# Patient Record
Sex: Male | Born: 1980 | Race: Black or African American | Hispanic: No | Marital: Single | Smoking: Former smoker
Health system: Southern US, Community
[De-identification: ages and names within clinical notes are randomized; demographics above are authoritative.]

## PROBLEM LIST (undated history)

## (undated) DIAGNOSIS — I509 Heart failure, unspecified: Secondary | ICD-10-CM

## (undated) DIAGNOSIS — G4730 Sleep apnea, unspecified: Secondary | ICD-10-CM

## (undated) DIAGNOSIS — I10 Essential (primary) hypertension: Secondary | ICD-10-CM

## (undated) DIAGNOSIS — E669 Obesity, unspecified: Secondary | ICD-10-CM

## (undated) HISTORY — PX: HAND TENDON SURGERY: SHX663

---

## 2000-09-14 ENCOUNTER — Encounter: Payer: Self-pay | Admitting: Emergency Medicine

## 2000-09-14 ENCOUNTER — Emergency Department (HOSPITAL_COMMUNITY): Admission: EM | Admit: 2000-09-14 | Discharge: 2000-09-14 | Payer: Self-pay | Admitting: Emergency Medicine

## 2000-10-19 ENCOUNTER — Encounter: Payer: Self-pay | Admitting: Emergency Medicine

## 2000-10-19 ENCOUNTER — Emergency Department (HOSPITAL_COMMUNITY): Admission: EM | Admit: 2000-10-19 | Discharge: 2000-10-19 | Payer: Self-pay | Admitting: Emergency Medicine

## 2003-02-10 ENCOUNTER — Emergency Department (HOSPITAL_COMMUNITY): Admission: AD | Admit: 2003-02-10 | Discharge: 2003-02-10 | Payer: Self-pay | Admitting: Emergency Medicine

## 2005-12-19 ENCOUNTER — Emergency Department (HOSPITAL_COMMUNITY): Admission: EM | Admit: 2005-12-19 | Discharge: 2005-12-19 | Payer: Self-pay | Admitting: Emergency Medicine

## 2005-12-25 ENCOUNTER — Emergency Department (HOSPITAL_COMMUNITY): Admission: EM | Admit: 2005-12-25 | Discharge: 2005-12-25 | Payer: Self-pay | Admitting: Family Medicine

## 2006-10-27 ENCOUNTER — Emergency Department (HOSPITAL_COMMUNITY): Admission: EM | Admit: 2006-10-27 | Discharge: 2006-10-27 | Payer: Self-pay | Admitting: Emergency Medicine

## 2015-05-30 ENCOUNTER — Emergency Department (HOSPITAL_COMMUNITY)
Admission: EM | Admit: 2015-05-30 | Discharge: 2015-05-30 | Disposition: A | Discharge status: Home / Self Care | Payer: Self-pay | Attending: Emergency Medicine | Admitting: Emergency Medicine

## 2015-05-30 ENCOUNTER — Encounter (HOSPITAL_COMMUNITY): Payer: Self-pay | Admitting: Emergency Medicine

## 2015-05-30 ENCOUNTER — Emergency Department (HOSPITAL_COMMUNITY): Payer: Self-pay

## 2015-05-30 DIAGNOSIS — J209 Acute bronchitis, unspecified: Secondary | ICD-10-CM | POA: Insufficient documentation

## 2015-05-30 DIAGNOSIS — F17200 Nicotine dependence, unspecified, uncomplicated: Secondary | ICD-10-CM | POA: Insufficient documentation

## 2015-05-30 DIAGNOSIS — Z79899 Other long term (current) drug therapy: Secondary | ICD-10-CM | POA: Insufficient documentation

## 2015-05-30 MED ORDER — PREDNISONE 20 MG PO TABS: 40.0000 mg | ORAL_TABLET | Freq: Every day | ORAL | Status: DC

## 2015-05-30 MED ORDER — IPRATROPIUM BROMIDE 0.02 % IN SOLN
0.5000 mg | Freq: Once | RESPIRATORY_TRACT | Status: DC
  Filled 2015-05-30: qty 2.5

## 2015-05-30 MED ORDER — ALBUTEROL SULFATE (2.5 MG/3ML) 0.083% IN NEBU
5.0000 mg | INHALATION_SOLUTION | Freq: Once | RESPIRATORY_TRACT | Status: AC
  Administered 2015-05-30: 5 mg via RESPIRATORY_TRACT

## 2015-05-30 MED ORDER — ALBUTEROL SULFATE (2.5 MG/3ML) 0.083% IN NEBU
INHALATION_SOLUTION | RESPIRATORY_TRACT | Status: AC
  Filled 2015-05-30: qty 6

## 2015-05-30 MED ORDER — OXYMETAZOLINE HCL 0.05 % NA SOLN: 1.0000 | Freq: Two times a day (BID) | NASAL | Status: DC

## 2015-05-30 MED ORDER — METHYLPREDNISOLONE SODIUM SUCC 125 MG IJ SOLR
125.0000 mg | Freq: Once | INTRAMUSCULAR | Status: AC
  Administered 2015-05-30: 125 mg via INTRAVENOUS
  Filled 2015-05-30: qty 2

## 2015-05-30 MED ORDER — ALBUTEROL (5 MG/ML) CONTINUOUS INHALATION SOLN
10.0000 mg/h | INHALATION_SOLUTION | RESPIRATORY_TRACT | Status: DC
  Administered 2015-05-30: 10 mg/h via RESPIRATORY_TRACT
  Filled 2015-05-30: qty 20

## 2015-05-30 MED ORDER — LORAZEPAM 1 MG PO TABS
2.0000 mg | ORAL_TABLET | Freq: Once | ORAL | Status: AC
  Administered 2015-05-30: 2 mg via ORAL
  Filled 2015-05-30: qty 2

## 2015-05-30 MED ORDER — BENZONATATE 100 MG PO CAPS: 200.0000 mg | ORAL_CAPSULE | Freq: Two times a day (BID) | ORAL | Status: DC | PRN

## 2015-05-30 NOTE — ED Provider Notes (Signed)
CSN: [id number]     Arrival date & time 05/30/15  0909 History   First MD Initiated Contact with Patient 05/30/15 1001     Chief Complaint  Patient presents with  . Shortness of Breath     (Consider location/radiation/quality/duration/timing/severity/associated sxs/prior Treatment) HPI   Patient is a 35 year old male with no pertinent past medical history who presents to the ED with complaint of shortness of breath. Patient reports over the past 5 days having nasal congestion, rhinorrhea, sore throat, nonproductive cough, chest tightness, shortness of breath and wheezing. He notes his girlfriend has also had similar symptoms over the past week. Patient states he took one dose of his wife's prescription of amoxicillin yesterday and reports taking one dose of her prednisone a few days ago. He states he has been using his albuterol inhaler at home with mild intermittent relief but notes when he used it this morning he did not have any relief. Denies fever, chills, body aches, headache, chest pain, palpitations, abdominal pain, nausea, vomiting, diarrhea.    History reviewed. No pertinent past medical history. History reviewed. No pertinent past surgical history. No family history on file. Social History  Substance Use Topics  . Smoking status: Current Some Day Smoker  . Smokeless tobacco: None  . Alcohol Use: Yes    Review of Systems  HENT: Positive for congestion, rhinorrhea and sore throat.   Respiratory: Positive for cough, chest tightness, shortness of breath and wheezing.   All other systems reviewed and are negative.     Allergies  Review of patient's allergies indicates no known allergies.  Home Medications   Prior to Admission medications   Medication Sig Start Date End Date Taking? Authorizing Provider  albuterol (PROVENTIL HFA;VENTOLIN HFA) 108 (90 Base) MCG/ACT inhaler Inhale 1 puff into the lungs every 6 (six) hours as needed for wheezing or shortness of breath. This  was his mothers inhaler. Pt only used this once.   Yes Historical Provider, MD  benzonatate (TESSALON) 100 MG capsule Take 2 capsules (200 mg total) by mouth 2 (two) times daily as needed for cough. 05/30/15   Barrett Henle, PA-C  oxymetazoline (AFRIN NASAL SPRAY) 0.05 % nasal spray Place 1 spray into both nostrils 2 (two) times daily. 05/30/15   Barrett Henle, PA-C  predniSONE (DELTASONE) 20 MG tablet Take 2 tablets (40 mg total) by mouth daily. 05/30/15   Satira Sark Nadeau, PA-C   BP 126/89 mmHg  Pulse 103  Temp(Src) 98.1 F (36.7 C) (Oral)  Resp 16  Ht 6' (1.829 m)  Wt 149.829 kg  BMI 44.79 kg/m2  SpO2 91% Physical Exam  Constitutional: He is oriented to person, place, and time. He appears well-developed and well-nourished.  HENT:  Head: Normocephalic and atraumatic.  Nose: Rhinorrhea present. Right sinus exhibits no maxillary sinus tenderness and no frontal sinus tenderness. Left sinus exhibits no maxillary sinus tenderness and no frontal sinus tenderness.  Mouth/Throat: Uvula is midline and mucous membranes are normal. Posterior oropharyngeal erythema present. No oropharyngeal exudate, posterior oropharyngeal edema or tonsillar abscesses.  Eyes: Conjunctivae and EOM are normal. Pupils are equal, round, and reactive to light. Right eye exhibits no discharge. Left eye exhibits no discharge. No scleral icterus.  Neck: Normal range of motion. Neck supple.  Cardiovascular: Normal rate, regular rhythm, normal heart sounds and intact distal pulses.   Pulmonary/Chest: Effort normal. No respiratory distress. He has wheezes (diffuse end-expiratory wheezing). He has no rales. He exhibits no tenderness.  Abdominal: Soft. Bowel sounds are  normal. He exhibits no distension and no mass. There is no tenderness. There is no rebound and no guarding.  Musculoskeletal: Normal range of motion. He exhibits no edema.  Lymphadenopathy:    He has no cervical adenopathy.  Neurological:  He is alert and oriented to person, place, and time.  Skin: Skin is warm and dry.  Nursing note and vitals reviewed.   ED Course  Procedures (including critical care time) Labs Review Labs Reviewed - No data to display  Imaging Review Dg Chest 2 View  05/30/2015  CLINICAL DATA:  Worsening cough and shortness of breath; history of bronchitis, current smoker. EXAM: CHEST  2 VIEW COMPARISON:  None in PACs FINDINGS: The lungs are adequately inflated. The interstitial markings are coarse bilaterally. There is no alveolar infiltrate, pleural effusion, or pneumothorax. The heart and pulmonary vascularity are normal. The mediastinum is normal in width. The bony thorax exhibits no acute abnormality. There is pleural thickening along the lateral thoracic walls bilaterally. IMPRESSION: Bronchitic changes which may be acute or chronic or a combination of both. There is no evidence of pneumonia nor CHF. Electronically Signed   By: David  [place] M.D.   On: 05/30/2015 10:01   I have personally reviewed and evaluated these images and lab results as part of my medical decision-making.   EKG Interpretation None      MDM   Final diagnoses:  Acute bronchitis, unspecified organism    Patient presents with shortness of breath, wheezing, nasal congestion, rhinorrhea and chest tightness and sore throat. Patient denies smoking and denies history of asthma however he reports having an albuterol inhaler at home. Initial HR 113 when I entered the pt's room, he was mildly tachypneic but could not sit still due to feeling anxious about being SOB. After finishing my exam, pt was resting comfortably in bed, respiratory rate 17, HR 86. Vital signs otherwise stable. Pt given albuterol neb prior to my initial evaluation. Exam revealed rhinorrhea, posterior oropharyngeal erythema and diffuse and expiratory wheezes bilaterally. Pt given continuous albuterol neb tx and solumedrol.  On reevaluation after continuous neb  treatment, patient reports his breathing has improved. On exam, wheezing significantly improved throughout.  Pt CXR negative for acute infiltrate, evidence of bronchitis. Patients symptoms are consistent with URI/bronchitis, likely viral etiology. Discussed that antibiotics are not indicated for viral infections. Pt will be discharged with symptomatic treatment, steroids, antitussive and decongestant.  Verbalizes understanding and is agreeable with plan. Pt is hemodynamically stable & in NAD prior to dc. Patient given resources to follow up with PCP.  Satira Sark [place], [place] 05/30/15 1245  Pricilla Loveless, MD 06/01/15 [phone]

## 2015-05-30 NOTE — ED Notes (Signed)
Patient states hard to breath and is nervous.  Applied [place] 3L states does not work has a stuffed up nose. Placed a NRB mask on 15 liters for 3 minutes patient relaxed then applied [place] back 3L states feels better with mask on breathing by mouth. Provider notified.

## 2015-05-30 NOTE — ED Notes (Signed)
Pt reports that he had bronchitis and has had shortness of breath all weekend. Pt talking in complete sentences. Pulse ox 97% on room air in triage.

## 2015-05-30 NOTE — Discharge Instructions (Signed)
Take your medications as prescribed. I recommend continuing to use her albuterol inhaler at home as needed for shortness of breath and/or wheezing. He may continue taking Tylenol and/or ibuprofen as prescribed over-the-counter seen for fever and body aches. I recommend drinking at least six 8 ounce glasses of water daily to remain hydrated. Please follow up with a primary care provider from the Resource Guide provided below in 3-4 days as needed if your symptoms have not improved. Please return to the Emergency Department if symptoms worsen or new onset of fever, difficulty breathing, coughing up blood, chest pain, worsening wheezing, lightheadedness, dizziness, abdominal pain, vomiting, unable to tolerate fluids.

## 2015-05-30 NOTE — ED Notes (Signed)
Provider at bedside

## 2016-03-27 ENCOUNTER — Emergency Department (HOSPITAL_COMMUNITY): Payer: Self-pay

## 2016-03-27 ENCOUNTER — Emergency Department (HOSPITAL_COMMUNITY)
Admission: EM | Admit: 2016-03-27 | Discharge: 2016-03-27 | Disposition: A | Discharge status: Home / Self Care | Payer: Self-pay | Attending: Emergency Medicine | Admitting: Emergency Medicine

## 2016-03-27 ENCOUNTER — Encounter (HOSPITAL_COMMUNITY): Payer: Self-pay

## 2016-03-27 DIAGNOSIS — F17200 Nicotine dependence, unspecified, uncomplicated: Secondary | ICD-10-CM | POA: Insufficient documentation

## 2016-03-27 DIAGNOSIS — M5417 Radiculopathy, lumbosacral region: Secondary | ICD-10-CM | POA: Insufficient documentation

## 2016-03-27 LAB — CBG MONITORING, ED
GLUCOSE-CAPILLARY: 117 mg/dL — AB (ref 65–99)
Glucose-Capillary: 117 mg/dL — ABNORMAL HIGH (ref 65–99)

## 2016-03-27 MED ORDER — METHOCARBAMOL 500 MG PO TABS: 500.0000 mg | ORAL_TABLET | Freq: Two times a day (BID) | ORAL | 0 refills | Status: DC

## 2016-03-27 MED ORDER — KETOROLAC TROMETHAMINE 60 MG/2ML IM SOLN
60.0000 mg | Freq: Once | INTRAMUSCULAR | Status: AC
  Administered 2016-03-27: 60 mg via INTRAMUSCULAR
  Filled 2016-03-27: qty 2

## 2016-03-27 MED ORDER — HYDROCODONE-ACETAMINOPHEN 5-325 MG PO TABS: 1.0000 | ORAL_TABLET | Freq: Four times a day (QID) | ORAL | 0 refills | Status: DC | PRN

## 2016-03-27 MED ORDER — OXYCODONE-ACETAMINOPHEN 5-325 MG PO TABS
1.0000 | ORAL_TABLET | Freq: Once | ORAL | Status: AC
  Administered 2016-03-27: 1 via ORAL
  Filled 2016-03-27: qty 1

## 2016-03-27 MED ORDER — PREDNISONE 20 MG PO TABS: 40.0000 mg | ORAL_TABLET | Freq: Every day | ORAL | 0 refills | Status: DC

## 2016-03-27 NOTE — ED Notes (Signed)
POC CBG 117 mg/dL reported to T. Kirichenko PA

## 2016-03-27 NOTE — ED Triage Notes (Signed)
Patient here with 4 days of lower back pain with radiation down right leg. Pain with any change in position. Pain burning through buttock

## 2016-03-27 NOTE — Discharge Instructions (Signed)
Take prednisone for inflammation until all gone. Take naprosyn after finish prednisone. Take norco for severe pain. Take robaxin for spasms. Follow up with primary care doctor or orthopedics.

## 2016-03-27 NOTE — ED Provider Notes (Signed)
[facility]-EMERGENCY DEPT Provider Note   CSN: [id number] Arrival date & time: 03/27/16  1211  By signing my name below, I, Clovis Pu, attest that this documentation has been prepared under the direction and in the presence of  Lashica Hannay, PA-C. Electronically Signed: Clovis Pu, ED Scribe. 03/27/16. 2:01 PM.  History   Chief Complaint Chief Complaint  Patient presents with  . Back Pain    The history is provided by the patient. No language interpreter was used.   HPI Comments:  Jesse Morris is a 36 y.o. male who presents to the Emergency Department complaining of moderate, sharp and dull back pain which radiates down the posterior aspect of his right leg to his calf x 4 days. Pt notes a hx of back pain x 1 year with episodes which last for 1 day and self-resolved. His pain is worse with any movement and with ambulation.No acute injuries. 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 Pt has taken ibuprofen and goody powder with no relief. Pt denies bowel/bladder incontinence, abdominal pain, numbness and weakness. No hx of DM or PCP. No other associated symptoms noted.   History reviewed. No pertinent past medical history.  There are no active problems to display for this patient.   History reviewed. No pertinent surgical history.   Home Medications    Prior to Admission medications   Medication Sig Start Date End Date Taking? Authorizing Provider  albuterol (PROVENTIL HFA;VENTOLIN HFA) 108 (90 Base) MCG/ACT inhaler Inhale 1 puff into the lungs every 6 (six) hours as needed for wheezing or shortness of breath. This was his mothers inhaler. Pt only used this once.    Historical Provider, MD  benzonatate (TESSALON) 100 MG capsule Take 2 capsules (200 mg total) by mouth 2 (two) times daily as needed for cough. 05/30/15   Barrett Henle, PA-C  oxymetazoline (AFRIN NASAL SPRAY) 0.05 % nasal spray Place 1 spray into both nostrils 2 (two) times daily. 05/30/15   Barrett Henle, PA-C  predniSONE (DELTASONE) 20 MG tablet Take 2 tablets (40 mg total) by mouth daily. 05/30/15   Barrett Henle, PA-C    Family History No family history on file.  Social History Social History  Substance Use Topics  . Smoking status: Current Some Day Smoker  . Smokeless tobacco: Never Used  . Alcohol use Yes     Allergies   Patient has no known allergies.   Review of Systems Review of Systems  Gastrointestinal: Negative for abdominal pain.  Genitourinary:       -bowel/bladder incontinence   Musculoskeletal: Positive for back pain and myalgias.  Neurological: Negative for weakness and numbness.   Physical Exam Updated Vital Signs BP (!) 154/109 (BP Location: Right Arm)   Pulse 99   Temp 98.9 F (37.2 C) (Oral)   Resp 20   SpO2 96%   Physical Exam  Constitutional: He is oriented to person, place, and time. He appears well-developed and well-nourished. No distress.  HENT:  Head: Normocephalic and atraumatic.  Eyes: Conjunctivae are normal.  Neck: Neck supple.  Cardiovascular: Normal rate, regular rhythm and normal heart sounds.   Pulmonary/Chest: Effort normal and breath sounds normal. No respiratory distress. He has no wheezes.  Abdominal: He exhibits no distension.  Musculoskeletal:  Midline lumbar spine tenderness, right SI joint tenderness and paravertebral spinal muscle tenderness. Pain with right straight leg raise. Normal appearing right knee and right hip.  Neurological: He is alert and oriented to person,  place, and time.  5/5 and equal lower extremity strength. 2+ and equal patellar reflexes bilaterally. Pt able to dorsiflex bilateral toes and feet with good strength against resistance. Equal sensation bilaterally over thighs and lower legs.   Skin: Skin is warm and dry.  Psychiatric: He has a normal mood and affect.  Nursing note and vitals reviewed.   ED Treatments / Results  DIAGNOSTIC STUDIES:  Oxygen Saturation is 96%  on RA, normal by my interpretation.    COORDINATION OF CARE:  1:58 PM Discussed treatment plan with pt at bedside and pt agreed to plan.  Labs (all labs ordered are listed, but only abnormal results are displayed) Labs Reviewed - No data to display  EKG  EKG Interpretation None       Radiology Dg Lumbar Spine Complete  Result Date: 03/27/2016 CLINICAL DATA:  Low back pain radiating to the posterior right leg for 4 days, no injury EXAM: LUMBAR SPINE - COMPLETE 4+ VIEW COMPARISON:  CT abdomen pelvis of 01/02/2008 FINDINGS: The lumbar vertebrae remain in normal alignment. There is mild degenerative disc disease at L4-5 where there is slight loss of intervertebral disc space. Slight loss of disc space also is noted at L2-3 with mild spurring and sclerosis. No compression deformity is seen. The facet joints are unremarkable. The SI joints appear corticated. The bowel gas pattern is nonspecific. IMPRESSION: Normal alignment.  Mild degenerative disc disease at L4-5 and L2-3. Electronically Signed   By: Dwyane Dee M.D.   On: 03/27/2016 14:52    Procedures Procedures (including critical care time)  Medications Ordered in ED Medications - No data to display   Initial Impression / Assessment and Plan / ED Course  I have reviewed the triage vital signs and the nursing notes.  Pertinent labs & imaging results that were available during my care of the patient were reviewed by me and considered in my medical decision making (see chart for details).  Clinical Course    Patient with lower back pain radiating to the right leg. Symptoms chronic, however worsened over the weekend. Patient's pain is most consistent with lumbar sacral radiculopathy. He does not have any numbness or weakness to the leg however, the trouble controlling his bowels or bladder. No retroflexed to suggest cauda equina. No fever. IV drug use. An injuries. Plain films show degenerative changes. No emergent MRI indicated at  this time. Will treat with prednisone, Norco, Robaxin. Will have him follow-up with her primary care doctor. Patient requested orthopedics referral, will give referral of orthopedist on call. However encouraged to follow with her family doctor if he can. Return precuations discussed.   Vitals:   03/27/16 1216 03/27/16 1534  BP: (!) 154/109 (!) 142/104  Pulse: 99 88  Resp: 20 18  Temp: 98.9 F (37.2 C)   TempSrc: Oral   SpO2: 96% 100%     Final Clinical Impressions(s) / ED Diagnoses   Final diagnoses:  Lumbosacral radiculopathy    New Prescriptions Discharge Medication List as of 03/27/2016  3:22 PM    START taking these medications   Details  HYDROcodone-acetaminophen (NORCO) 5-325 MG tablet Take 1 tablet by mouth every 6 (six) hours as needed for moderate pain., Starting Tue 03/27/2016, Print    methocarbamol (ROBAXIN) 500 MG tablet Take 1 tablet (500 mg total) by mouth 2 (two) times daily., Starting Tue 03/27/2016, Print       I personally performed the services described in this documentation, which was scribed in my presence. The  recorded information has been reviewed and is accurate.     Jaynie Crumble, PA-C 03/27/16 1642    Canary Brim Tegeler, MD 03/27/16 [phone]

## 2016-03-27 NOTE — ED Triage Notes (Signed)
Pt reports back pain worse when coughing or sneezing

## 2016-03-27 NOTE — ED Notes (Signed)
Declined W/C at D/C and was escorted to lobby by RN. 

## 2016-07-19 ENCOUNTER — Emergency Department (HOSPITAL_COMMUNITY): Payer: Self-pay

## 2016-07-19 ENCOUNTER — Emergency Department (HOSPITAL_COMMUNITY)
Admission: EM | Admit: 2016-07-19 | Discharge: 2016-07-19 | Disposition: A | Discharge status: Home / Self Care | Payer: Self-pay | Attending: Emergency Medicine | Admitting: Emergency Medicine

## 2016-07-19 ENCOUNTER — Encounter (HOSPITAL_COMMUNITY): Payer: Self-pay

## 2016-07-19 DIAGNOSIS — R062 Wheezing: Secondary | ICD-10-CM

## 2016-07-19 DIAGNOSIS — Z7982 Long term (current) use of aspirin: Secondary | ICD-10-CM | POA: Insufficient documentation

## 2016-07-19 DIAGNOSIS — F17200 Nicotine dependence, unspecified, uncomplicated: Secondary | ICD-10-CM | POA: Insufficient documentation

## 2016-07-19 DIAGNOSIS — R0602 Shortness of breath: Secondary | ICD-10-CM

## 2016-07-19 LAB — BASIC METABOLIC PANEL
ANION GAP: 13 (ref 5–15)
BUN: 12 mg/dL (ref 6–20)
CO2: 23 mmol/L (ref 22–32)
Calcium: 8.9 mg/dL (ref 8.9–10.3)
Chloride: 102 mmol/L (ref 101–111)
Creatinine, Ser: 1.33 mg/dL — ABNORMAL HIGH (ref 0.61–1.24)
GLUCOSE: 129 mg/dL — AB (ref 65–99)
POTASSIUM: 4.1 mmol/L (ref 3.5–5.1)
Sodium: 138 mmol/L (ref 135–145)

## 2016-07-19 LAB — I-STAT TROPONIN, ED
Troponin i, poc: 0.01 ng/mL (ref 0.00–0.08)
Troponin i, poc: 0.02 ng/mL (ref 0.00–0.08)

## 2016-07-19 LAB — CBC
HEMATOCRIT: 48.8 % (ref 39.0–52.0)
HEMOGLOBIN: 16.1 g/dL (ref 13.0–17.0)
MCH: 31.1 pg (ref 26.0–34.0)
MCHC: 33 g/dL (ref 30.0–36.0)
MCV: 94.4 fL (ref 78.0–100.0)
Platelets: 275 10*3/uL (ref 150–400)
RBC: 5.17 MIL/uL (ref 4.22–5.81)
RDW: 13.3 % (ref 11.5–15.5)
WBC: 5.4 10*3/uL (ref 4.0–10.5)

## 2016-07-19 LAB — D-DIMER, QUANTITATIVE (NOT AT ARMC): D DIMER QUANT: 0.39 ug{FEU}/mL (ref 0.00–0.50)

## 2016-07-19 MED ORDER — ALBUTEROL SULFATE (2.5 MG/3ML) 0.083% IN NEBU
5.0000 mg | INHALATION_SOLUTION | Freq: Once | RESPIRATORY_TRACT | Status: AC
  Administered 2016-07-19: 5 mg via RESPIRATORY_TRACT
  Filled 2016-07-19: qty 6

## 2016-07-19 MED ORDER — IPRATROPIUM BROMIDE 0.02 % IN SOLN
0.5000 mg | Freq: Once | RESPIRATORY_TRACT | Status: AC
  Administered 2016-07-19: 0.5 mg via RESPIRATORY_TRACT
  Filled 2016-07-19: qty 2.5

## 2016-07-19 MED ORDER — SODIUM CHLORIDE 0.9 % IV BOLUS (SEPSIS)
1000.0000 mL | Freq: Once | INTRAVENOUS | Status: AC
  Administered 2016-07-19: 1000 mL via INTRAVENOUS

## 2016-07-19 MED ORDER — ALBUTEROL SULFATE HFA 108 (90 BASE) MCG/ACT IN AERS
2.0000 | INHALATION_SPRAY | RESPIRATORY_TRACT | Status: DC | PRN
  Administered 2016-07-19: 2 via RESPIRATORY_TRACT
  Filled 2016-07-19: qty 6.7

## 2016-07-19 MED ORDER — PREDNISONE 20 MG PO TABS: 40.0000 mg | ORAL_TABLET | Freq: Every day | ORAL | 0 refills | Status: DC

## 2016-07-19 NOTE — ED Notes (Signed)
Patient ambulated with no complaints. O2 sat was 91 while ambulating.

## 2016-07-19 NOTE — Discharge Instructions (Signed)
Please read and follow all provided instructions.  Your diagnoses today include:  1. Shortness of breath   2. Wheezing     Tests performed today include:  Chest x-ray - does not show any pneumonia  D-dimer - does not suggest blood clot  Troponin and EKG - does not suggest heart problem  Vital signs. See below for your results today.   Medications prescribed:   Prednisone - steroid medicine   It is best to take this medication in the morning to prevent sleeping problems. If you are diabetic, monitor your blood sugar closely and stop taking Prednisone if blood sugar is over 300. Take with food to prevent stomach upset.    Albuterol inhaler - medication that opens up your airway  Use inhaler as follows: 1-2 puffs with spacer every 4 hours as needed for wheezing, cough, or shortness of breath.   Take any prescribed medications only as directed.  Home care instructions:  Follow any educational materials contained in this packet.  Follow-up instructions: Please follow-up with your primary care provider in the next 3 days for further evaluation of your symptoms and a recheck if you are not feeling better.   Return instructions:   Please return to the Emergency Department if you experience worsening symptoms.  Please return with worsening wheezing, shortness of breath, or difficulty breathing.  Return with persistent fever above 101F.   Please return if you have any other emergent concerns.  Additional Information:  Your vital signs today were: BP (!) 125/91    Pulse (!) 113    Temp 98.5 F (36.9 C) (Oral)    Resp (!) 22    Ht 6' (1.829 m)    Wt (!) 145.2 kg    SpO2 (!) 88%    BMI 43.40 kg/m  If your blood pressure (BP) was elevated above 135/85 this visit, please have this repeated by your doctor within one month. --------------

## 2016-07-19 NOTE — ED Provider Notes (Signed)
[facility]PT Provider Note   CSN: [id number] Arrival date & time: 07/19/16  1241     History   Chief Complaint Chief Complaint  Patient presents with  . Chest Pain  . Shortness of Breath    HPI Jesse Morris is a 36 y.o. male.  Patient with possible history of high blood pressure and high cholesterol although is on no medications for these, smoking -- presents with complaint of burning in his chest for the past 2 days. This has been associated with wheezing, shortness of breath. Cough has been productive of pink sputum although he denies overt blood. Pain does not radiate and is not exertional. No vomiting. He does get sweaty at times. He states that symptoms are improved sitting up. Patient denies risk factors for pulmonary embolism including: unilateral leg swelling or tenderness, history of DVT/PE/other blood clots, use of exogenous hormones, recent immobilizations, recent surgery, recent travel (>4hr segment), malignancy, hemoptysis. He states that his symptoms today reminds him of when he was seen in 05/2015. At that time a breathing treatment made him better. Also noted to be tachycardic at that time. No strong family history of coronary artery disease in first-degree relatives, family history of diabetes but not in the patient. No recent viral illnesses or fevers. No heavy NSAID or alcohol use. Denies IV drug use or rashes.        History reviewed. No pertinent past medical history.  There are no active problems to display for this patient.   History reviewed. No pertinent surgical history.     Home Medications    Prior to Admission medications   Medication Sig Start Date End Date Taking? Authorizing Provider  Aspirin-Acetaminophen-Caffeine (GOODYS EXTRA STRENGTH) 260-130-16 MG TABS Take 1 packet by mouth every 6 (six) hours as needed.   Yes [provider]  albuterol (PROVENTIL HFA;VENTOLIN HFA) 108 (90 Base) MCG/ACT inhaler Inhale 1 puff into the lungs  every 6 (six) hours as needed for wheezing or shortness of breath. This was his mothers inhaler. Pt only used this once.    [provider]  predniSONE (DELTASONE) 20 MG tablet Take 2 tablets (40 mg total) by mouth daily. 07/19/16   Renne Crigler, PA-C    Family History No family history on file.  Social History Social History  Substance Use Topics  . Smoking status: Current Some Day Smoker  . Smokeless tobacco: Never Used  . Alcohol use Yes     Allergies   Patient has no known allergies.   Review of Systems Review of Systems  Constitutional: Positive for diaphoresis. Negative for fever.  HENT: Negative for rhinorrhea and sore throat.   Eyes: Negative for redness.  Respiratory: Positive for cough, shortness of breath and wheezing.   Cardiovascular: Positive for chest pain. Negative for palpitations and leg swelling.  Gastrointestinal: Negative for abdominal pain, diarrhea, nausea and vomiting.  Genitourinary: Negative for dysuria.  Musculoskeletal: Negative for back pain, myalgias and neck pain.  Skin: Negative for rash.  Neurological: Negative for syncope, light-headedness and headaches.  Psychiatric/Behavioral: The patient is not nervous/anxious.      Physical Exam Updated Vital Signs BP (!) 132/96   Pulse (!) 108   Temp 98.5 F (36.9 C) (Oral)   Resp (!) 28   Ht 6' (1.829 m)   Wt (!) 145.2 kg   SpO2 94%   BMI 43.40 kg/m   Physical Exam  Constitutional: He appears well-developed and well-nourished.  HENT:  Head: Normocephalic and atraumatic.  Mouth/Throat: Oropharynx  is clear and moist and mucous membranes are normal. Mucous membranes are not dry.  Eyes: Conjunctivae are normal. Right eye exhibits no discharge. Left eye exhibits no discharge.  Neck: Trachea normal and normal range of motion. Neck supple. Normal carotid pulses and no JVD present. No muscular tenderness present. Carotid bruit is not present. No tracheal deviation present.    Cardiovascular: Regular rhythm, S1 normal, S2 normal, normal heart sounds and intact distal pulses.  Tachycardia present.  Exam reveals no gallop, no distant heart sounds, no friction rub and no decreased pulses.   No murmur heard. Pulmonary/Chest: Effort normal. No respiratory distress. He has wheezes (mild/moderate end-expiratory wheezing, scattered). He exhibits no tenderness.  Abdominal: Soft. Normal aorta and bowel sounds are normal. There is no tenderness. There is no rebound and no guarding.  Musculoskeletal: He exhibits no edema.  Neurological: He is alert.  Skin: Skin is warm and dry. He is not diaphoretic. No cyanosis. No pallor.  Psychiatric: He has a normal mood and affect.  Nursing note and vitals reviewed.    ED Treatments / Results  Labs (all labs ordered are listed, but only abnormal results are displayed) Labs Reviewed  BASIC METABOLIC PANEL - Abnormal; Notable for the following:       Result Value   Glucose, Bld 129 (*)    Creatinine, Ser 1.33 (*)    All other components within normal limits  CBC  D-DIMER, QUANTITATIVE (NOT AT [facility])  I-STAT TROPOININ, ED  Rosezena Sensor, ED    ED ECG REPORT   Date: 07/19/2016  Rate: 122  Rhythm: sinus tachycardia  QRS Axis: normal  Intervals: normal  ST/T Wave abnormalities: normal  Conduction Disutrbances:none  Narrative Interpretation:   Old EKG Reviewed: none available  I have personally reviewed the EKG tracing and agree with the computerized printout as noted.  Radiology Dg Chest 2 View  Result Date: 07/19/2016 CLINICAL DATA:  Cough, shortness of breath and chest burning since last night. EXAM: CHEST  2 VIEW COMPARISON:  PA and lateral chest 05/30/2015. FINDINGS: The lungs are clear. Heart size is normal. No pneumothorax or pleural fluid. No bony abnormality. IMPRESSION: Normal chest. Electronically Signed   By: Drusilla Kanner M.D.   On: 07/19/2016 13:20    Procedures Procedures (including critical care  time)  Medications Ordered in ED Medications  albuterol (PROVENTIL) (2.5 MG/3ML) 0.083% nebulizer solution 5 mg (not administered)  ipratropium (ATROVENT) nebulizer solution 0.5 mg (not administered)  sodium chloride 0.9 % bolus 1,000 mL (not administered)     Initial Impression / Assessment and Plan / ED Course  I have reviewed the triage vital signs and the nursing notes.  Pertinent labs & imaging results that were available during my care of the patient were reviewed by me and considered in my medical decision making (see chart for details).     3:00 PM Patient seen and examined. Work-up initiated. Medications ordered. He will need screened for PE (dimer added) and delta troponin and EKG. He is not in respiratory distress or overt heart failure. CXR reviewed.   Vital signs reviewed and are as follows: BP (!) 132/96   Pulse (!) 108   Temp 98.5 F (36.9 C) (Oral)   Resp (!) 28   Ht 6' (1.829 m)   Wt (!) 145.2 kg   SpO2 94%   BMI 43.40 kg/m   4:01 PM I rechecked patient. O2 sat documented at 85%. O2 sat is low 90's when I enter. Pt exam is  unchanged. He has yet to receive breathing meds, fluids. Pending labs.   6:18 PM Patient States that he felt much better after first breathing treatment. Wheezing nearly resolved on reexam. Second troponin EKG unchanged. D-dimer is negative.  Patient has ambulated with borderline oxygen saturation. Although, patient states that he was not short of breath with ambulation. One additional breathing treatment ordered and given. Patient continues to feel well after this.  Oxygen saturation documented to be 88% with heart rate of 113 (post-albuterol) When I enter the room, patient is 93% on room air and has a heart rate in the upper 90s. He is in no distress. Speaks in full sentences.   We discussed return to the emergency department with worsening shortness of breath, severe lightheadedness or passing out, chest pains, fever, new symptoms or other  concerns.  He verbalizes understanding and agrees with plan.  Final Clinical Impressions(s) / ED Diagnoses   Final diagnoses:  Shortness of breath  Wheezing   Patient with shortness of breath and wheezing similar to episode over a year ago. Patient was evaluated for cardiac etiology and has had 2 negative troponins and unchanged, nonischemic EKG. No concern for pericarditis. No signs of myocarditis or risk factors for the same. He has had a d-dimer that is negative. No objective findings of DVT or PE on exam. Patient has ambulated without significant shortness of breath. Suspect that symptoms are related to bronchospasm. Do not suspect COPD or CHF. Patient is clinically improved in emergency department and after discussion with patient, feel that he is appropriate for discharge at this time. Return instructions discussed as above.  New Prescriptions New Prescriptions   PREDNISONE (DELTASONE) 20 MG TABLET    Take 2 tablets (40 mg total) by mouth daily.     Renne Crigler, PA-C 07/19/16 Franki Cabot, MD 07/23/16 [phone]

## 2016-07-19 NOTE — ED Notes (Signed)
Pt verbalized understanding discharge instructions and denies any further needs or questions at this time. VS stable, ambulatory and steady gait.   

## 2016-07-19 NOTE — ED Triage Notes (Addendum)
Pt presents to the ed with complaints of burning sensation in his chest and shortness of breath that started yesterday morning.  Pt is speaking in full sentences, tachycardic and diaphoretic in triage.  Denies history of the same

## 2016-07-19 NOTE — Progress Notes (Signed)
ED CM met with patient at bedside to discuss transitional care , patient confirms information Patient is uninsured and without an PCP. CM discussed the [facility] and services rendered patient is agreeable with establishing care at the clinic. Patient instructed to contact the clinic tomorrow after 9a to schedule ED follow up appointment, patient verbalized understanding teach back done. No further questions or concerns, all information placed on AVS as well. No further ED CM needs identified.

## 2016-10-19 ENCOUNTER — Emergency Department (HOSPITAL_COMMUNITY)
Admission: EM | Admit: 2016-10-19 | Discharge: 2016-10-19 | Disposition: A | Discharge status: Home / Self Care | Payer: Self-pay | Attending: Emergency Medicine | Admitting: Emergency Medicine

## 2016-10-19 ENCOUNTER — Encounter (HOSPITAL_COMMUNITY): Payer: Self-pay | Admitting: *Deleted

## 2016-10-19 DIAGNOSIS — L03116 Cellulitis of left lower limb: Secondary | ICD-10-CM | POA: Insufficient documentation

## 2016-10-19 DIAGNOSIS — Z79899 Other long term (current) drug therapy: Secondary | ICD-10-CM | POA: Insufficient documentation

## 2016-10-19 DIAGNOSIS — F17200 Nicotine dependence, unspecified, uncomplicated: Secondary | ICD-10-CM | POA: Insufficient documentation

## 2016-10-19 HISTORY — DX: Obesity, unspecified: E66.9

## 2016-10-19 MED ORDER — DOXYCYCLINE HYCLATE 100 MG PO TABS
100.0000 mg | ORAL_TABLET | Freq: Once | ORAL | Status: AC
  Administered 2016-10-19: 100 mg via ORAL
  Filled 2016-10-19: qty 1

## 2016-10-19 MED ORDER — DOXYCYCLINE HYCLATE 100 MG PO CAPS: 100.0000 mg | ORAL_CAPSULE | Freq: Two times a day (BID) | ORAL | 0 refills | Status: AC

## 2016-10-19 NOTE — ED Triage Notes (Signed)
Pt has redness and warmth to left lower leg, behind his left knee. Reports that pain started approx 3 days ago. Denies any injury. Pain increases with ambulation. Denies fever.

## 2016-10-19 NOTE — ED Provider Notes (Signed)
[facility] Provider Note   CSN: [id number] Arrival date & time: 10/19/16  1254     History   Chief Complaint Chief Complaint  Patient presents with  . Knee Pain    HPI Jesse Morris is a 36 y.o. male with history of obesity who presents today with chief complaint acute onset, gradually worsening redness swelling and tenderness to the left lower leg. He states that 3 days ago he noted a small area of redness to the medial aspect of his left leg just distal to the knee and since then he has had progressively worsening spread of erythema, swelling, and tenderness. He states pain is constant, throbbing, and worsens on palpation and movement of the knee. He states walking is painful as well. Pain does not radiate past the areas of erythema. He denies any known trauma or falls, no history of DVT or PE, denies recent travel or surgeries or hemoptysis. He denies shortness of breath, fevers, chest pain, nausea, or vomiting. Has not tried anything for his symptoms. He does state that he recently went swimming in a pool that had been closed for some time and does not think that it had been cleaned properly.  The history is provided by the patient.    Past Medical History:  Diagnosis Date  . Obesity     There are no active problems to display for this patient.   History reviewed. No pertinent surgical history.     Home Medications    Prior to Admission medications   Medication Sig Start Date End Date Taking? Authorizing Provider  albuterol (PROVENTIL HFA;VENTOLIN HFA) 108 (90 Base) MCG/ACT inhaler Inhale 1 puff into the lungs every 6 (six) hours as needed for wheezing or shortness of breath. This was his mothers inhaler. Pt only used this once.    [provider]  Aspirin-Acetaminophen-Caffeine (GOODYS EXTRA STRENGTH) 260-130-16 MG TABS Take 1 packet by mouth every 6 (six) hours as needed.    [provider]  doxycycline (VIBRAMYCIN) 100 MG capsule Take 1 capsule  (100 mg total) by mouth 2 (two) times daily. 10/19/16 10/29/16  Michela Pitcher A, PA-C  predniSONE (DELTASONE) 20 MG tablet Take 2 tablets (40 mg total) by mouth daily. 07/19/16   Renne Crigler, PA-C    Family History History reviewed. No pertinent family history.  Social History Social History  Substance Use Topics  . Smoking status: Current Some Day Smoker  . Smokeless tobacco: Never Used  . Alcohol use Yes     Allergies   Patient has no known allergies.   Review of Systems Review of Systems  Constitutional: Negative for chills and fever.  Respiratory: Negative for shortness of breath.   Cardiovascular: Negative for chest pain.  Skin: Positive for color change.  Neurological: Negative for syncope, weakness and numbness.  All other systems reviewed and are negative.    Physical Exam Updated Vital Signs BP (!) 149/97 (BP Location: Right Arm)   Pulse 75   Temp 98.2 F (36.8 C) (Oral)   Resp 18   Ht 6' (1.829 m)   SpO2 95%   Physical Exam  Constitutional: He appears well-developed and well-nourished. No distress.  HENT:  Head: Normocephalic and atraumatic.  Eyes: Conjunctivae are normal. Right eye exhibits no discharge. Left eye exhibits no discharge.  Neck: No JVD present. No tracheal deviation present.  Cardiovascular: Normal rate and intact distal pulses.   2+ DP/PT pulses bilaterally, Homans absent bilaterally  Pulmonary/Chest: Effort normal.  Abdominal: He exhibits no  distension.  Musculoskeletal: He exhibits no edema.  No tenderness to palpation of the left knee joint. Normal range of motion of the knee with pain on flexion due to  erythema distal to the left knee noted in the skin section. No varus or valgus deformity, negative anterior/posterior drawer tests. 5/5 strength of bilateral lower extremity major muscle groups. No appreciable swelling of the LLE compared tot he right and calves measure 44cm bl at widest point  Neurological: He is alert.  Fluent speech,  no facial droop, sensation intact to soft touch of bilateral extremities. Antalgic gait.  Skin: Skin is warm and dry. Capillary refill takes less than 2 seconds. There is erythema.  10 cm x 6 cm area of erythema to the left medial lower leg just distal to the knee. No involvement of the calf. Compartments are soft. The area is indurated without evidence of fluctuance. It is tender to palpation. No drainage or bleeding noted.  Psychiatric: He has a normal mood and affect. His behavior is normal.  Nursing note and vitals reviewed.    ED Treatments / Results  Labs (all labs ordered are listed, but only abnormal results are displayed) Labs Reviewed - No data to display  EKG  EKG Interpretation None       Radiology No results found.  Procedures Procedures (including critical care time)  Medications Ordered in ED Medications  doxycycline (VIBRA-TABS) tablet 100 mg (100 mg Oral Given 10/19/16 1715)     Initial Impression / Assessment and Plan / ED Course  I have reviewed the triage vital signs and the nursing notes.  Pertinent labs & imaging results that were available during my care of the patient were reviewed by me and considered in my medical decision making (see chart for details).     Patient presents with cellulitis of the left lower extremity. Afebrile, vital signs are stable. He is neurovascularly intact. No evidence of gout in the absence of pain in the knee joint. Doubt DVT in the absence of risk factors and without appreciable swelling or pain of the calf. Pt is without risk factors for HIV; no recent use of steroids or other immunosuppressive medications; no Hx of diabetes.  Pt is without gross abscess for which I&D would be possible.  Area marked and pt encouraged to return if redness begins to streak, extends beyond the markings, and/or fever or nausea/vomiting develop.  Pt is alert, oriented, NAD, afebrile, non tachycardic, nonseptic and nontoxic appearing.  Pt to be  d/c on oral antibiotics with strict f/u instructions.  First dose of doxycycline given in the ED.   Final Clinical Impressions(s) / ED Diagnoses   Final diagnoses:  Cellulitis of left lower extremity    New Prescriptions New Prescriptions   DOXYCYCLINE (VIBRAMYCIN) 100 MG CAPSULE    Take 1 capsule (100 mg total) by mouth 2 (two) times daily.     Jeanie Sewer, PA-C 10/19/16 1726    Donnetta Hutching, MD 10/20/16 2133

## 2016-10-19 NOTE — Discharge Instructions (Signed)
Please take all of your antibiotics until finished!   You may develop abdominal discomfort or diarrhea from the antibiotic.  You may help offset this with probiotics which you can buy or get in yogurt. Do not eat  or take the probiotics until 2 hours after your antibiotic.   Apply ice or heat to the affected area for comfort. He may take ibuprofen or Tylenol as needed for pain. Return to the ED immediately if any concerning signs or symptoms develop such as fever, worsening spread of redness or streaking past the marked areas.

## 2016-10-19 NOTE — ED Notes (Signed)
Pt unable to sign due to signature pad unavailable, pt verbalized understanding of d.c instructions and had no further questions

## 2021-03-16 DIAGNOSIS — J101 Influenza due to other identified influenza virus with other respiratory manifestations: Secondary | ICD-10-CM | POA: Insufficient documentation

## 2021-04-13 ENCOUNTER — Emergency Department (HOSPITAL_COMMUNITY): Payer: BC Managed Care – PPO

## 2021-04-13 ENCOUNTER — Other Ambulatory Visit: Payer: Self-pay

## 2021-04-13 ENCOUNTER — Inpatient Hospital Stay (HOSPITAL_COMMUNITY)
Admission: EM | Admit: 2021-04-13 | Discharge: 2021-04-19 | DRG: 189 | Disposition: A | Discharge status: Home / Self Care | Payer: BC Managed Care – PPO | Attending: Family Medicine | Admitting: Family Medicine

## 2021-04-13 ENCOUNTER — Encounter (HOSPITAL_COMMUNITY): Payer: Self-pay

## 2021-04-13 DIAGNOSIS — R0902 Hypoxemia: Secondary | ICD-10-CM | POA: Diagnosis not present

## 2021-04-13 DIAGNOSIS — I509 Heart failure, unspecified: Secondary | ICD-10-CM | POA: Diagnosis not present

## 2021-04-13 DIAGNOSIS — I2510 Atherosclerotic heart disease of native coronary artery without angina pectoris: Secondary | ICD-10-CM | POA: Diagnosis present

## 2021-04-13 DIAGNOSIS — R778 Other specified abnormalities of plasma proteins: Secondary | ICD-10-CM

## 2021-04-13 DIAGNOSIS — Z20822 Contact with and (suspected) exposure to covid-19: Secondary | ICD-10-CM | POA: Diagnosis present

## 2021-04-13 DIAGNOSIS — R0609 Other forms of dyspnea: Secondary | ICD-10-CM | POA: Diagnosis not present

## 2021-04-13 DIAGNOSIS — J9601 Acute respiratory failure with hypoxia: Secondary | ICD-10-CM | POA: Diagnosis present

## 2021-04-13 DIAGNOSIS — Z77098 Contact with and (suspected) exposure to other hazardous, chiefly nonmedicinal, chemicals: Secondary | ICD-10-CM | POA: Diagnosis present

## 2021-04-13 DIAGNOSIS — F17210 Nicotine dependence, cigarettes, uncomplicated: Secondary | ICD-10-CM | POA: Diagnosis present

## 2021-04-13 DIAGNOSIS — I77810 Thoracic aortic ectasia: Secondary | ICD-10-CM | POA: Diagnosis present

## 2021-04-13 DIAGNOSIS — I5021 Acute systolic (congestive) heart failure: Secondary | ICD-10-CM

## 2021-04-13 DIAGNOSIS — E782 Mixed hyperlipidemia: Secondary | ICD-10-CM | POA: Diagnosis present

## 2021-04-13 DIAGNOSIS — Z79899 Other long term (current) drug therapy: Secondary | ICD-10-CM

## 2021-04-13 DIAGNOSIS — R0602 Shortness of breath: Secondary | ICD-10-CM | POA: Diagnosis present

## 2021-04-13 DIAGNOSIS — I428 Other cardiomyopathies: Secondary | ICD-10-CM | POA: Diagnosis present

## 2021-04-13 DIAGNOSIS — D649 Anemia, unspecified: Secondary | ICD-10-CM | POA: Diagnosis present

## 2021-04-13 DIAGNOSIS — G4733 Obstructive sleep apnea (adult) (pediatric): Secondary | ICD-10-CM | POA: Diagnosis present

## 2021-04-13 DIAGNOSIS — Z6841 Body Mass Index (BMI) 40.0 and over, adult: Secondary | ICD-10-CM

## 2021-04-13 DIAGNOSIS — I10 Essential (primary) hypertension: Secondary | ICD-10-CM | POA: Diagnosis not present

## 2021-04-13 DIAGNOSIS — E662 Morbid (severe) obesity with alveolar hypoventilation: Secondary | ICD-10-CM | POA: Diagnosis present

## 2021-04-13 DIAGNOSIS — G4730 Sleep apnea, unspecified: Secondary | ICD-10-CM

## 2021-04-13 DIAGNOSIS — R7989 Other specified abnormal findings of blood chemistry: Secondary | ICD-10-CM

## 2021-04-13 DIAGNOSIS — E876 Hypokalemia: Secondary | ICD-10-CM

## 2021-04-13 DIAGNOSIS — I272 Pulmonary hypertension, unspecified: Secondary | ICD-10-CM

## 2021-04-13 DIAGNOSIS — I110 Hypertensive heart disease with heart failure: Secondary | ICD-10-CM | POA: Diagnosis present

## 2021-04-13 DIAGNOSIS — I158 Other secondary hypertension: Secondary | ICD-10-CM | POA: Diagnosis not present

## 2021-04-13 DIAGNOSIS — J9611 Chronic respiratory failure with hypoxia: Secondary | ICD-10-CM | POA: Diagnosis present

## 2021-04-13 DIAGNOSIS — E66813 Obesity, class 3: Secondary | ICD-10-CM

## 2021-04-13 HISTORY — DX: Essential (primary) hypertension: I10

## 2021-04-13 HISTORY — DX: Sleep apnea, unspecified: G47.30

## 2021-04-13 LAB — COMPREHENSIVE METABOLIC PANEL
ALT: 26 U/L (ref 0–44)
AST: 29 U/L (ref 15–41)
Albumin: 3.8 g/dL (ref 3.5–5.0)
Alkaline Phosphatase: 43 U/L (ref 38–126)
Anion gap: 8 (ref 5–15)
BUN: 14 mg/dL (ref 6–20)
CO2: 29 mmol/L (ref 22–32)
Calcium: 8.8 mg/dL — ABNORMAL LOW (ref 8.9–10.3)
Chloride: 102 mmol/L (ref 98–111)
Creatinine, Ser: 0.98 mg/dL (ref 0.61–1.24)
GFR, Estimated: >60 mL/min (ref >60)
Glucose, Bld: 118 mg/dL — ABNORMAL HIGH (ref 70–99)
Potassium: 3.5 mmol/L (ref 3.5–5.1)
Sodium: 139 mmol/L (ref 135–145)
Total Bilirubin: 0.1 mg/dL — ABNORMAL LOW (ref 0.3–1.2)
Total Protein: 7.3 g/dL (ref 6.5–8.1)

## 2021-04-13 LAB — CBC WITH DIFFERENTIAL/PLATELET
Abs Immature Granulocytes: 0.01 10*3/uL (ref 0.00–0.07)
Basophils Absolute: 0 10*3/uL (ref 0.0–0.1)
Basophils Relative: 1 %
Eosinophils Absolute: 0.2 10*3/uL (ref 0.0–0.5)
Eosinophils Relative: 4 %
HCT: 38.2 % — ABNORMAL LOW (ref 39.0–52.0)
Hemoglobin: 11.9 g/dL — ABNORMAL LOW (ref 13.0–17.0)
Immature Granulocytes: 0 %
Lymphocytes Relative: 36 %
Lymphs Abs: 1.8 10*3/uL (ref 0.7–4.0)
MCH: 28.7 pg (ref 26.0–34.0)
MCHC: 31.2 g/dL (ref 30.0–36.0)
MCV: 92 fL (ref 80.0–100.0)
Monocytes Absolute: 0.4 10*3/uL (ref 0.1–1.0)
Monocytes Relative: 9 %
Neutro Abs: 2.5 10*3/uL (ref 1.7–7.7)
Neutrophils Relative %: 50 %
Platelets: 284 10*3/uL (ref 150–400)
RBC: 4.15 MIL/uL — ABNORMAL LOW (ref 4.22–5.81)
RDW: 15.6 % — ABNORMAL HIGH (ref 11.5–15.5)
WBC: 4.9 10*3/uL (ref 4.0–10.5)
nRBC: 0 % (ref 0.0–0.2)

## 2021-04-13 LAB — RESP PANEL BY RT-PCR (FLU A&B, COVID) ARPGX2
Influenza A by PCR: NEGATIVE
Influenza B by PCR: NEGATIVE
SARS Coronavirus 2 by RT PCR: NEGATIVE

## 2021-04-13 LAB — I-STAT CHEM 8, ED
BUN: 13 mg/dL (ref 6–20)
Calcium, Ion: 1.16 mmol/L (ref 1.15–1.40)
Chloride: 100 mmol/L (ref 98–111)
Creatinine, Ser: 1 mg/dL (ref 0.61–1.24)
Glucose, Bld: 117 mg/dL — ABNORMAL HIGH (ref 70–99)
HCT: 39 % (ref 39.0–52.0)
Hemoglobin: 13.3 g/dL (ref 13.0–17.0)
Potassium: 3.6 mmol/L (ref 3.5–5.1)
Sodium: 141 mmol/L (ref 135–145)
TCO2: 31 mmol/L (ref 22–32)

## 2021-04-13 LAB — TROPONIN I (HIGH SENSITIVITY): Troponin I (High Sensitivity): 66 ng/L — ABNORMAL HIGH (ref <18)

## 2021-04-13 LAB — BLOOD GAS, VENOUS
Acid-Base Excess: 3.2 mmol/L — ABNORMAL HIGH (ref 0.0–2.0)
Bicarbonate: 29.3 mmol/L — ABNORMAL HIGH (ref 20.0–28.0)
O2 Saturation: 78.7 %
Patient temperature: 98.6
pCO2, Ven: 54.6 mmHg (ref 44.0–60.0)
pH, Ven: 7.348 (ref 7.250–7.430)
pO2, Ven: 48.8 mmHg — ABNORMAL HIGH (ref 32.0–45.0)

## 2021-04-13 LAB — BRAIN NATRIURETIC PEPTIDE: B Natriuretic Peptide: 31.6 pg/mL (ref 0.0–100.0)

## 2021-04-13 LAB — LACTIC ACID, PLASMA
Lactic Acid, Venous: 2.4 mmol/L (ref 0.5–1.9)
Lactic Acid, Venous: 1.3 mmol/L (ref 0.5–1.9)

## 2021-04-13 LAB — D-DIMER, QUANTITATIVE: D-Dimer, Quant: 0.48 ug/mL-FEU (ref 0.00–0.50)

## 2021-04-13 MED ORDER — ENOXAPARIN SODIUM 60 MG/0.6ML IJ SOSY
60.0000 mg | PREFILLED_SYRINGE | INTRAMUSCULAR | Status: DC
  Administered 2021-04-14 – 2021-04-19 (×5): 60 mg via SUBCUTANEOUS
  Filled 2021-04-13 (×5): qty 0.6

## 2021-04-13 MED ORDER — ACETAMINOPHEN 325 MG PO TABS
650.0000 mg | ORAL_TABLET | Freq: Four times a day (QID) | ORAL | Status: DC | PRN
  Administered 2021-04-16 – 2021-04-17 (×3): 650 mg via ORAL
  Filled 2021-04-13 (×3): qty 2

## 2021-04-13 MED ORDER — UMECLIDINIUM BROMIDE 62.5 MCG/ACT IN AEPB
1.0000 | INHALATION_SPRAY | Freq: Every day | RESPIRATORY_TRACT | Status: DC
  Administered 2021-04-14 – 2021-04-19 (×5): 1 via RESPIRATORY_TRACT
  Filled 2021-04-13: qty 7

## 2021-04-13 MED ORDER — MOMETASONE FURO-FORMOTEROL FUM 200-5 MCG/ACT IN AERO
2.0000 | INHALATION_SPRAY | Freq: Two times a day (BID) | RESPIRATORY_TRACT | Status: DC
  Administered 2021-04-14 – 2021-04-19 (×10): 2 via RESPIRATORY_TRACT
  Filled 2021-04-13: qty 8.8

## 2021-04-13 MED ORDER — IOHEXOL 350 MG/ML SOLN
100.0000 mL | Freq: Once | INTRAVENOUS | Status: AC | PRN
  Administered 2021-04-13: 100 mL via INTRAVENOUS

## 2021-04-13 MED ORDER — SODIUM CHLORIDE 0.9 % IV BOLUS
1000.0000 mL | Freq: Once | INTRAVENOUS | Status: AC
  Administered 2021-04-13: 1000 mL via INTRAVENOUS

## 2021-04-13 MED ORDER — ALBUTEROL SULFATE (2.5 MG/3ML) 0.083% IN NEBU: 2.5000 mg | INHALATION_SOLUTION | RESPIRATORY_TRACT | Status: DC | PRN

## 2021-04-13 MED ORDER — ONDANSETRON HCL 4 MG PO TABS: 4.0000 mg | ORAL_TABLET | Freq: Four times a day (QID) | ORAL | Status: DC | PRN

## 2021-04-13 MED ORDER — LABETALOL HCL 5 MG/ML IV SOLN
5.0000 mg | INTRAVENOUS | Status: DC | PRN
  Administered 2021-04-13 – 2021-04-14 (×2): 10 mg via INTRAVENOUS
  Administered 2021-04-14: 5 mg via INTRAVENOUS
  Administered 2021-04-14 – 2021-04-15 (×2): 10 mg via INTRAVENOUS
  Filled 2021-04-13 (×5): qty 4

## 2021-04-13 MED ORDER — ONDANSETRON HCL 4 MG/2ML IJ SOLN: 4.0000 mg | Freq: Four times a day (QID) | INTRAMUSCULAR | Status: DC | PRN

## 2021-04-13 MED ORDER — ACETAMINOPHEN 650 MG RE SUPP: 650.0000 mg | Freq: Four times a day (QID) | RECTAL | Status: DC | PRN

## 2021-04-13 MED ORDER — ACETAMINOPHEN 500 MG PO TABS
1000.0000 mg | ORAL_TABLET | Freq: Once | ORAL | Status: AC
  Administered 2021-04-13: 1000 mg via ORAL
  Filled 2021-04-13: qty 2

## 2021-04-13 NOTE — ED Triage Notes (Signed)
Pt c/o SOB since he had pneumonia last month. Pt states he has sleep apnea and does not have a Cipap and is always tired. Pt states his SOB has worsened and he feels constantly winded.

## 2021-04-13 NOTE — Assessment & Plan Note (Addendum)
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

## 2021-04-13 NOTE — ED Notes (Signed)
This RN walked into room with O2 reading 79% RA and pt had labored breathing. Pt placed on 2L [place] O2 and back to 98%.

## 2021-04-13 NOTE — Assessment & Plan Note (Addendum)
-   regimen being adjusted per cardiology still - see CHF

## 2021-04-13 NOTE — H&P (Signed)
History and Physical    Patient: Jesse Morris [id number] DOB: 03/15/1980 DOA: 04/13/2021 DOS: the patient was seen and examined on 04/13/2021 PCP: Trey Sailors, PA  Patient coming from: Home  Chief Complaint:  Chief Complaint  Patient presents with   Shortness of Breath    HPI: Jesse Morris is a 41 y.o. male with medical history significant of Obesity and OSA.  Pt recently admitted to [facility] in Jan with SOB due to Influenza A.  Not needing O2 at time of discharge.  Ever since that admit pt has had persistent SOB which worsened in last couple of days.  Severe dyspnea especially on exertion.   He states that he has not felt better this whole month.  He states that over the past few days he has felt more short of breath.  He continues to have severe daytime sleepiness he did have a positive sleep study and was diagnosed with sleep apnea by pulmonology approximately 1 year ago but never received CPAP machine.  Review of Systems: As mentioned in the history of present illness. All other systems reviewed and are negative. Past Medical History:  Diagnosis Date   Obesity    History reviewed. No pertinent surgical history. Social History:  reports that he has been smoking. He has never used smokeless tobacco. He reports current alcohol use. He reports current drug use. Drug: Marijuana.  No Known Allergies  History reviewed. No pertinent family history.  Prior to Admission medications   Medication Sig Start Date End Date Taking? Authorizing Provider  albuterol (PROVENTIL HFA;VENTOLIN HFA) 108 (90 Base) MCG/ACT inhaler Inhale 1 puff into the lungs every 6 (six) hours as needed for wheezing or shortness of breath. This was his mothers inhaler. Pt only used this once.   Yes [provider]  losartan (COZAAR) 25 MG tablet Take 25 mg by mouth daily. 06/01/19  Yes [provider]  predniSONE (DELTASONE) 20 MG tablet Take 2 tablets (40 mg total) by mouth daily. Patient  not taking: Reported on 04/13/2021 07/19/16   Carlisle Cater, PA-C    Physical Exam: Vitals:   04/13/21 1915 04/13/21 1930 04/13/21 2145 04/13/21 2251  BP: (!) 151/106 (!) 157/93 (!) 177/132 (!) 190/130  Pulse: 95 86 85 89  Resp: (!) 24 11 18    Temp:      TempSrc:      SpO2: 97% 95% 100% 98%  Weight:      Height:       Constitutional: NAD, calm, comfortable Eyes: PERRL, lids and conjunctivae normal ENMT: Mucous membranes are moist. Posterior pharynx clear of any exudate or lesions.Normal dentition.  Neck: normal, supple, no masses, no thyromegaly Respiratory: clear to auscultation bilaterally, no wheezing, no crackles. Increased respiratory effort despite good air movement Cardiovascular: Regular rate and rhythm, no murmurs / rubs / gallops. No extremity edema. 2+ pedal pulses. No carotid bruits.  Abdomen: no tenderness, no masses palpated. No hepatosplenomegaly. Bowel sounds positive.  Musculoskeletal: no clubbing / cyanosis. No joint deformity upper and lower extremities. Good ROM, no contractures. Normal muscle tone.  Skin: no rashes, lesions, ulcers. No induration Neurologic: CN 2-12 grossly intact. Sensation intact, DTR normal. Strength 5/5 in all 4.  Psychiatric: Normal judgment and insight. Alert and oriented x 3. Normal mood.    Data Reviewed:  CTA chest neg for PE or other acute pathology.  BNP nl, ABG shows a compensated respiratory acidosis.  Assessment and Plan: * Acute respiratory failure with hypoxia ([place])- (present on admission) Pt with  dyspnea, new O2 requirement at rest and with ambulation, severe SOB with O2 sats as low as 79% on room air.  Unclear why. Does have some hypercapnea but this appears compensated / chronic, probably due to OSA. CTA chest is neg for acute pathology. BNP is normal. Good air movement without wheezing to suggest COPD exacerbation on exam. Will admit with QHS CPAP for OSA Treat HTN Check 2d echo Check PFTs Cont pulse ox and tele  monitor. PRN and scheduled nebs  HTN (hypertension)- (present on admission) PRN labetalol ordered  OSA (obstructive sleep apnea)- (present on admission) Definitely has untreated OSA which isnt helping matters.  Not sure if this is or isnt the entire picture of todays presentation though. CPAP QHS       Advance Care Planning:   Code Status: Full Code  Consults: None  Family Communication: Admit to inpatient  Severity of Illness: The appropriate patient status for this patient is INPATIENT. Inpatient status is judged to be reasonable and necessary in order to provide the required intensity of service to ensure the patient's safety. The patient's presenting symptoms, physical exam findings, and initial radiographic and laboratory data in the context of their chronic comorbidities is felt to place them at high risk for further clinical deterioration. Furthermore, it is not anticipated that the patient will be medically stable for discharge from the hospital within 2 midnights of admission.   * I certify that at the point of admission it is my clinical judgment that the patient will require inpatient hospital care spanning beyond 2 midnights from the point of admission due to high intensity of service, high risk for further deterioration and high frequency of surveillance required.*  Author: Etta Quill., DO 04/13/2021 10:55 PM  For on call review [email or web address].

## 2021-04-13 NOTE — ED Provider Notes (Signed)
[place] DEPT Provider Note   CSN: [id number] Arrival date & time: 04/13/21  1807     History  Chief Complaint  Patient presents with   Shortness of [place] is a 41 y.o. male.   Shortness of Breath  Patient is a 41 year old morbidly obese gentleman with a past medical history significant for obstructive sleep apnea he has not had his CPAP machine he states he has had gradually worsening shortness of breath over the past year  He tells me that he was admitted to [facility] last month 4th-7th due to influenza he states that he was discharged home without oxygen he states he did not require oxygen he was given some cough medications and Tamiflu.  He states that he has not felt better this whole month.  He states that over the past few days he has felt more short of breath.  He continues to have severe daytime sleepiness he did have a positive sleep study and was diagnosed with sleep apnea by pulmonology approximately 1 year ago but never received CPAP machine.  + recent hospitalization  No recent surgeries, long travel, hemoptysis, estrogen containing OCP, cancer history.  No unilateral leg swelling.  No history of PE or VTE.      Home Medications Prior to Admission medications   Medication Sig Start Date End Date Taking? Authorizing Provider  albuterol (PROVENTIL HFA;VENTOLIN HFA) 108 (90 Base) MCG/ACT inhaler Inhale 1 puff into the lungs every 6 (six) hours as needed for wheezing or shortness of breath. This was his mothers inhaler. Pt only used this once.   Yes [provider]  losartan (COZAAR) 25 MG tablet Take 25 mg by mouth daily. 06/01/19  Yes [provider]  predniSONE (DELTASONE) 20 MG tablet Take 2 tablets (40 mg total) by mouth daily. Patient not taking: Reported on 04/13/2021 07/19/16   Carlisle Cater, PA-C      Allergies    Patient has no known allergies.    Review of Systems   Review  of Systems  Respiratory:  Positive for shortness of breath.    Physical Exam Updated Vital Signs BP (!) 157/93    Pulse 86    Temp 97.9 F (36.6 C) (Oral)    Resp 11    Ht 6' (1.829 m)    Wt (!) 145.2 kg    SpO2 95%    BMI 43.40 kg/m  Physical Exam Vitals and nursing note reviewed.  Constitutional:      General: He is not in acute distress.    Appearance: He is obese.     Comments: Patient is obese 41 year old male.  Pleasant, able answer questions appropriately and follow commands  HENT:     Head: Normocephalic and atraumatic.     Nose: Nose normal.  Eyes:     General: No scleral icterus. Cardiovascular:     Rate and Rhythm: Normal rate and regular rhythm.     Pulses: Normal pulses.     Heart sounds: Normal heart sounds.  Pulmonary:     Effort: Tachypnea present.     Breath sounds: No wheezing, rhonchi or rales.     Comments: Patient is mildly tachypneic.  Speaking in full sentences Appears winded. Abdominal:     Palpations: Abdomen is soft.     Tenderness: There is no abdominal tenderness.  Musculoskeletal:     Cervical back: Normal range of motion.     Right lower leg: No edema.  Left lower leg: No edema.  Skin:    General: Skin is warm and dry.     Capillary Refill: Capillary refill takes less than 2 seconds.  Neurological:     Mental Status: He is alert. Mental status is at baseline.  Psychiatric:        Mood and Affect: Mood normal.        Behavior: Behavior normal.    ED Results / Procedures / Treatments   Labs (all labs ordered are listed, but only abnormal results are displayed) Labs Reviewed  LACTIC ACID, PLASMA - Abnormal; Notable for the following components:      Result Value   Lactic Acid, Venous 2.4 (*)    All other components within normal limits  CBC WITH DIFFERENTIAL/PLATELET - Abnormal; Notable for the following components:   RBC 4.15 (*)    Hemoglobin 11.9 (*)    HCT 38.2 (*)    RDW 15.6 (*)    All other components within normal limits   COMPREHENSIVE METABOLIC PANEL - Abnormal; Notable for the following components:   Glucose, Bld 118 (*)    Calcium 8.8 (*)    Total Bilirubin 0.1 (*)    All other components within normal limits  I-STAT CHEM 8, ED - Abnormal; Notable for the following components:   Glucose, Bld 117 (*)    All other components within normal limits  RESP PANEL BY RT-PCR (FLU A&B, COVID) ARPGX2  CULTURE, BLOOD (ROUTINE X 2)  CULTURE, BLOOD (ROUTINE X 2)  BRAIN NATRIURETIC PEPTIDE  D-DIMER, QUANTITATIVE  LACTIC ACID, PLASMA  BLOOD GAS, VENOUS  TROPONIN I (HIGH SENSITIVITY)    EKG EKG Interpretation  Date/Time:  Thursday April 13 2021 18:14:43 EST Ventricular Rate:  92 PR Interval:  162 QRS Duration: 98 QT Interval:  384 QTC Calculation: 475 R Axis:   43 Text Interpretation: Sinus rhythm Probable left atrial enlargement No significant change since last tracing Confirmed by Blanchie Dessert [phone]) on 04/13/2021 7:57:31 PM  Radiology DG Chest Portable 1 View  Result Date: 04/13/2021 CLINICAL DATA:  Short of breath, cough, hypoxia EXAM: PORTABLE CHEST 1 VIEW COMPARISON:  03/15/2021 FINDINGS: Single frontal view of the chest demonstrates an unremarkable cardiac silhouette. No airspace disease, effusion, or pneumothorax. No acute bony abnormalities. IMPRESSION: 1. No acute intrathoracic process. Electronically Signed   By: Randa Ngo M.D.   On: 04/13/2021 19:53    Procedures .Critical Care Performed by: Tedd Sias, PA Authorized by: Tedd Sias, PA   Critical care provider statement:    Critical care time (minutes):  35   Critical care time was exclusive of:  Separately billable procedures and treating other patients and teaching time   Critical care was necessary to treat or prevent imminent or life-threatening deterioration of the following conditions:  Respiratory failure   Critical care was time spent personally by me on the following activities:  Development of treatment plan  with patient or surrogate, review of old charts, re-evaluation of patient's condition, pulse oximetry, ordering and review of radiographic studies, ordering and review of laboratory studies, ordering and performing treatments and interventions, obtaining history from patient or surrogate, examination of patient and evaluation of patient's response to treatment   Care discussed with: admitting provider      Medications Ordered in ED Medications  acetaminophen (TYLENOL) tablet 1,000 mg (1,000 mg Oral Given 04/13/21 1912)  sodium chloride 0.9 % bolus 1,000 mL (1,000 mLs Intravenous New Bag/Given 04/13/21 1924)    ED Course/ Medical  Decision Making/ A&P                           Medical Decision Making Amount and/or Complexity of Data Reviewed Labs: ordered. Radiology: ordered.  Risk OTC drugs.   This patient presents to the ED for concern of shortness of breath, this involves a number of treatment options, and is a complaint that carries with it a high risk of complications and morbidity.  The differential diagnosis includes  The causes for shortness of breath include but are not limited to Cardiac (AHF, pericardial effusion and tamponade, arrhythmias, ischemia, etc) Respiratory (COPD, asthma, pneumonia, pneumothorax, primary pulmonary hypertension, PE/VQ mismatch) Hematological (anemia) Neuromuscular (ALS, Guillain-Barr, etc)  pulmonary hypertension, PE    Co morbidities: Discussed in HPI   Brief History:  Patient is a 41 year old gentleman with a past medical history significant for   EMR reviewed including pt PMHx, past surgical history and past visits to ER.   See HPI for more details   Lab Tests:  I ordered and independently interpreted labs.  The pertinent results include:    Labs notable for CMP is unremarkable CBC with mild anemia no leukocytosis.  D-dimer within normal limits BNP within normal limits.  COVID influenza negative.  Lactic mildly elevated 2.4 patient has  received saline.  We will recheck.  Blood cultures pending.  Discussed with my attending physician assessed patient at bedside agrees with my plan and added on VBG   Imaging Studies:  NAD. I personally reviewed all imaging studies and no acute abnormality found. I agree with radiology interpretation.  Unremarkable chest x-ray no infiltrate or evidence of pulmonary edema.    Cardiac Monitoring:  The patient was maintained on a cardiac monitor.  I personally viewed and interpreted the cardiac monitored which showed an underlying rhythm of: NSR EKG non-ischemic   Medicines ordered:  I ordered medication including Tylenol and normal saline 1 L Reevaluation of the patient after these medicines showed that the patient stayed the same I have reviewed the patients home medicines and have made adjustments as needed   Critical Interventions:  Placed on CPAP   Attending:  I discussed this case with my attending physician who cosigned this note including patient's presenting symptoms, physical exam, and planned diagnostics and interventions. Attending physician stated agreement with plan or made changes to plan which were implemented.   Attending physician assessed patient at bedside.    Reevaluation:  After the interventions noted above I re-evaluated patient and found that they have :improved   Social Determinants of Health:  The patient's social determinants of health were a factor in the care of this patient    Problem List / ED Course:  Hypoxia, dyspnea  Patient with overall reassuring work-up so far.  Is inexplicably hypoxic no significant infectious symptoms.  Will admit to medicine for hypoxic respiratory failure.  Dispostion:  After consideration of the diagnostic results and the patients response to treatment, I feel that the patent would benefit from admission   J. Alcario Drought to admit.    Final Clinical Impression(s) / ED Diagnoses Final diagnoses:  Hypoxia   Sleep apnea, unspecified type    Rx / DC Orders ED Discharge Orders     None         Tedd Sias, [place] 04/13/21 2215    Blanchie Dessert, MD 04/14/21 1643

## 2021-04-13 NOTE — Assessment & Plan Note (Addendum)
-   Previously diagnosed with OSA.  Apparently has had difficulty obtaining home CPAP -He does endorse that he sleeps better when using CPAP in the hospital -Sleep study was a little over a year ago he states, which was arranged outpatient by his PCP previously; he does endorse that he would be compliant if able to obtain the machine - trying to obtain NIV settings from [facility] in efforts to send to [facility] - CM assisting to try and get CPAP for discharge

## 2021-04-14 ENCOUNTER — Inpatient Hospital Stay (HOSPITAL_COMMUNITY): Payer: BC Managed Care – PPO

## 2021-04-14 DIAGNOSIS — R7989 Other specified abnormal findings of blood chemistry: Secondary | ICD-10-CM

## 2021-04-14 DIAGNOSIS — R0609 Other forms of dyspnea: Secondary | ICD-10-CM

## 2021-04-14 DIAGNOSIS — E782 Mixed hyperlipidemia: Secondary | ICD-10-CM | POA: Diagnosis present

## 2021-04-14 DIAGNOSIS — E876 Hypokalemia: Secondary | ICD-10-CM

## 2021-04-14 DIAGNOSIS — R778 Other specified abnormalities of plasma proteins: Secondary | ICD-10-CM

## 2021-04-14 LAB — CBC
HCT: 35.6 % — ABNORMAL LOW (ref 39.0–52.0)
Hemoglobin: 11.2 g/dL — ABNORMAL LOW (ref 13.0–17.0)
MCH: 29.2 pg (ref 26.0–34.0)
MCHC: 31.5 g/dL (ref 30.0–36.0)
MCV: 93 fL (ref 80.0–100.0)
Platelets: 243 10*3/uL (ref 150–400)
RBC: 3.83 MIL/uL — ABNORMAL LOW (ref 4.22–5.81)
RDW: 15.5 % (ref 11.5–15.5)
WBC: 5.4 10*3/uL (ref 4.0–10.5)
nRBC: 0 % (ref 0.0–0.2)

## 2021-04-14 LAB — BASIC METABOLIC PANEL
Anion gap: 5 (ref 5–15)
BUN: 12 mg/dL (ref 6–20)
CO2: 31 mmol/L (ref 22–32)
Calcium: 8.2 mg/dL — ABNORMAL LOW (ref 8.9–10.3)
Chloride: 101 mmol/L (ref 98–111)
Creatinine, Ser: 1 mg/dL (ref 0.61–1.24)
GFR, Estimated: >60 mL/min (ref >60)
Glucose, Bld: 93 mg/dL (ref 70–99)
Potassium: 3.1 mmol/L — ABNORMAL LOW (ref 3.5–5.1)
Sodium: 137 mmol/L (ref 135–145)

## 2021-04-14 LAB — HIV ANTIBODY (ROUTINE TESTING W REFLEX): HIV Screen 4th Generation wRfx: NONREACTIVE

## 2021-04-14 LAB — ECHOCARDIOGRAM COMPLETE
AR max vel: 6.13 cm2
AV Peak grad: 5.5 mmHg
Ao pk vel: 1.17 m/s
Area-P 1/2: 3.27 cm2
Height: 72 in
S' Lateral: 4.8 cm
Single Plane A4C EF: 28.5 %
Weight: 5120 oz

## 2021-04-14 LAB — TROPONIN I (HIGH SENSITIVITY): Troponin I (High Sensitivity): 79 ng/L — ABNORMAL HIGH (ref <18)

## 2021-04-14 MED ORDER — FUROSEMIDE 10 MG/ML IJ SOLN
40.0000 mg | Freq: Every day | INTRAMUSCULAR | Status: DC
  Administered 2021-04-14 – 2021-04-15 (×2): 40 mg via INTRAVENOUS
  Filled 2021-04-14 (×2): qty 4

## 2021-04-14 MED ORDER — PERFLUTREN LIPID MICROSPHERE
1.0000 mL | INTRAVENOUS | Status: AC | PRN
  Administered 2021-04-14: 2 mL via INTRAVENOUS
  Filled 2021-04-14: qty 10

## 2021-04-14 MED ORDER — POTASSIUM CHLORIDE CRYS ER 20 MEQ PO TBCR
40.0000 meq | EXTENDED_RELEASE_TABLET | ORAL | Status: AC
  Administered 2021-04-14 (×2): 40 meq via ORAL
  Filled 2021-04-14 (×2): qty 2

## 2021-04-14 NOTE — Evaluation (Signed)
Physical Therapy Evaluation Patient Details Name: Jesse Morris MRN: [id number] DOB: 10/25/1980 Today's Date: 04/14/2021  History of Present Illness  Pt admitted from home with c/o acute respiratory failure with hypoxia.  Pt with hx of obesity and sleep apnea - does not use CPAP at home  Clinical Impression   Pt admitted as above and presenting as fully IND with all basic mobility tasks but ltd by SOB with exertion.  Pt SOB and desat to 86% with standing from bed twice on RA.  2L O2 applied for ambulation and pt maintained sats at 96% or higher ambulating 400'.  Pt with no PT needs at this time and PT service will sign off.     Recommendations for follow up therapy are one component of a multi-disciplinary discharge planning process, led by the attending physician.  Recommendations may be updated based on patient status, additional functional criteria and insurance authorization.  Follow Up Recommendations No PT follow up    Assistance Recommended at Discharge None  Patient can return home with the following       Equipment Recommendations None recommended by PT  Recommendations for Other Services       Functional Status Assessment Patient has not had a recent decline in their functional status     Precautions / Restrictions Restrictions Weight Bearing Restrictions: No      Mobility  Bed Mobility Overal bed mobility: Modified Independent                  Transfers Overall transfer level: Independent                      Ambulation/Gait Ambulation/Gait assistance: Independent Gait Distance (Feet): 400 Feet              Stairs            Wheelchair Mobility    Modified Rankin (Stroke Patients Only)       Balance Overall balance assessment: Independent                                           Pertinent Vitals/Pain Pain Assessment Pain Assessment: No/denies pain    Home Living Family/patient expects to be  discharged to:: Private residence Living Arrangements: Alone Available Help at Discharge: Family;Available PRN/intermittently Type of Home: Apartment Home Access: Stairs to enter Entrance Stairs-Rails: Right Entrance Stairs-Number of Steps: 4     Home Equipment: None      Prior Function Prior Level of Function : Independent/Modified Independent             Mobility Comments: Working in [employer]        Extremity/Trunk Assessment   Upper Extremity Assessment Upper Extremity Assessment: Overall WFL for tasks assessed    Lower Extremity Assessment Lower Extremity Assessment: Overall WFL for tasks assessed    Cervical / Trunk Assessment Cervical / Trunk Assessment: Normal  Communication   Communication: No difficulties  Cognition Arousal/Alertness: Awake/alert Behavior During Therapy: WFL for tasks assessed/performed Overall Cognitive Status: Within Functional Limits for tasks assessed                                          General Comments  Exercises     Assessment/Plan    PT Assessment Patient does not need any further PT services  PT Problem List         PT Treatment Interventions      PT Goals (Current goals can be found in the Care Plan section)  Acute Rehab PT Goals Patient Stated Goal: Regain breathing PT Goal Formulation: All assessment and education complete, DC therapy    Frequency       Co-evaluation               AM-PAC PT "6 Clicks" Mobility  Outcome Measure Help needed turning from your back to your side while in a flat bed without using bedrails?: None Help needed moving from lying on your back to sitting on the side of a flat bed without using bedrails?: None Help needed moving to and from a bed to a chair (including a wheelchair)?: None Help needed standing up from a chair using your arms (e.g., wheelchair or bedside chair)?: None Help needed to walk in hospital room?:  None Help needed climbing 3-5 steps with a railing? : None 6 Click Score: 24    End of Session Equipment Utilized During Treatment: Oxygen Activity Tolerance: Patient tolerated treatment well Patient left: Other (comment) (sitting EOB) Nurse Communication: Mobility status PT Visit Diagnosis: Difficulty in walking, not elsewhere classified (R26.2)    Time: 8841-6606 PT Time Calculation (min) (ACUTE ONLY): 17 min   Charges:   PT Evaluation $PT Eval Low Complexity: 1 Low          Mauro Kaufmann PT Acute Rehabilitation Services Pager [phone] Office [phone]   Lashone Stauber 04/14/2021, 11:09 AM

## 2021-04-14 NOTE — Assessment & Plan Note (Addendum)
-   Complicates overall prognosis and care - Body mass index is 43.44 kg/m. - Weight Loss and Dietary Counseling given

## 2021-04-14 NOTE — Assessment & Plan Note (Addendum)
-   Replete as needed 

## 2021-04-14 NOTE — Progress Notes (Signed)
Echocardiogram 2D Echocardiogram has been performed.  Jesse Morris 04/14/2021, 2:42 PM

## 2021-04-14 NOTE — Progress Notes (Signed)
PROGRESS NOTE Jesse Morris  LMB:[id number] DOB: 1981/02/10 DOA: 04/13/2021 PCP: Norm Salt, PA   Brief Narrative/Hospital Course: Jesse Morris, 41 y.o. male 40 y.o. male with medical history significant of Obesity and OSA who was recently admitted to [facility] in Jan with SOB due to Influenza A.  Not needing O2 at time of discharge. Since that admission he has had persistent SOB which worsened in last couple of days,severe dyspnea especially on exertion and has not felt better this whole month.  He states that over the past few days he has felt more short of breath.  He continues to have severe daytime sleepiness he did have a positive sleep study and was diagnosed with sleep apnea by pulmonology approximately 1 year ago but never received CPAP machine. He was seen in the ED, 7% on room air, underwent further work-up with CTA no acute finding, BNP normal having good air movement without wheezing. Admitted on supplemental oxygen, and plan for 2D echo PFT nightly CPAP    Subjective:  Seen/Examination. Feels lot better after being on cpap overnight. No chest pain. He reports he has gained some weight.  Standing wt pending this am. Wt Readings from Last 3 Encounters:  04/13/21 (!) 145.2 kg  07/19/16 (!) 145.2 kg  05/30/15 (!) 149.8 kg    Assessment and Plan: * Acute respiratory failure with hypoxia (HCC)- (present on admission) 79% room air in the ED, so far work-up with normal BNP, CTA, no wheezing on admission. BNP could be normal in obese patient.Does have underlying sleep apnea with morbid obesity untreated-and w/ trial of CPAP overnight he feels lot better.  F/u Echocardiogram possible PFT if bale to be done inpatient, continue supplemental oxygen as needed/scheduled nebs.  We will do trial of  IV diuresis.  May need pulmonary eval if does not resolve   Obesity, Class III, BMI 40-49.9 (morbid obesity) (HCC) BMI 43.4.  He will benefit with extensive PCP follow-up aggressive weight loss  healthy lifestyle and diet changes  Elevated troponin Troponin borderline elevated.  Could be demand ischemia in the setting of hypoxia.  Follow-up echocardiogram  Hypokalemia We will replete potassium.  HTN (hypertension)- (present on admission) Monitor, PRN labetalol ordered  OSA (obstructive sleep apnea)- (present on admission) Definitely has untreated OSA which is likely the reason behind his presentation. With CPAP QHS he feels better. Slept last night after several months. He had sleep apnea test abt a year ago and never got his machine delivered. SW consulted for DME at home.   TOC consulted and Home cpap ordered  DVT prophylaxis: lovenox Code Status:   Code Status: Full Code Family Communication: plan of care discussed with patient at bedside.  Disposition: Currently not medically stable for discharge. Status is: Inpatient Remains inpatient appropriate because: For ongoing management of hypoxia, IV diuresis Objective: Vitals last 24 hrs: Vitals:   04/14/21 0430 04/14/21 0515 04/14/21 0645 04/14/21 0730  BP: (!) 158/96 (!) 144/95 (!) 146/122 (!) 161/117  Pulse: 94 92 99 75  Resp: 16 (!) 23 (!) 21 (!) 21  Temp:      TempSrc:      SpO2: 96% 95% 91% 96%  Weight:      Height:       Weight change:   Physical Examination: General exam: AAox3, obese, older than stated age, weak appearing. HEENT:Oral mucosa moist, Ear/Nose WNL grossly, dentition normal. Respiratory system: bilaterally diminished BS, no use of accessory muscle Cardiovascular system: S1 & S2 +, No JVD,. Gastrointestinal system:  Abdomen soft,NT,ND, BS+ Nervous System:Alert, awake, moving extremities and grossly nonfocal Extremities: LE edema none,distal peripheral pulses palpable.  Skin: No rashes,no icterus. MSK: Normal muscle bulk,tone, power  Medications reviewed:  Scheduled Meds:  enoxaparin (LOVENOX) injection  60 mg Subcutaneous Q24H   furosemide  40 mg Intravenous Daily   mometasone-formoterol   2 puff Inhalation BID   potassium chloride  40 mEq Oral Q3H   umeclidinium bromide  1 puff Inhalation Daily  Continuous Infusions:   Diet Order             Diet Heart Room service appropriate? Yes; Fluid consistency: Thin  Diet effective now                   Intake/Output Summary (Last 24 hours) at 04/14/2021 0855 Last data filed at 04/13/2021 2243 Gross per 24 hour  Intake 1000 ml  Output --  Net 1000 ml   Net IO Since Admission: 1,000 mL [04/14/21 0855]  Wt Readings from Last 3 Encounters:  04/13/21 (!) 145.2 kg  07/19/16 (!) 145.2 kg  05/30/15 (!) 149.8 kg     Unresulted Labs (From admission, onward)     Start     Ordered   04/13/21 2248  HIV Antibody (routine testing w rflx)  (HIV Antibody (Routine testing w reflex) panel)  Once,   R        04/13/21 2249   04/13/21 1857  Blood culture (routine x 2)  BLOOD CULTURE X 2,   STAT      04/13/21 1857          Data Reviewed: I have personally reviewed following labs and imaging studies CBC: Recent Labs  Lab 04/13/21 1857 04/13/21 1920 04/14/21 0548  WBC 4.9  --  5.4  NEUTROABS 2.5  --   --   HGB 11.9* 13.3 11.2*  HCT 38.2* 39.0 35.6*  MCV 92.0  --  93.0  PLT 284  --  243   Basic Metabolic Panel: Recent Labs  Lab 04/13/21 1857 04/13/21 1920 04/14/21 0548  NA 139 141 137  K 3.5 3.6 3.1*  CL 102 100 101  CO2 29  --  31  GLUCOSE 118* 117* 93  BUN 14 13 12   CREATININE 0.98 1.00 1.00  CALCIUM 8.8*  --  8.2*  Estimated Creatinine Clearance: 145.3 mL/min (by C-G formula based on SCr of 1 mg/dL). Liver Function Tests: Recent Labs  Lab 04/13/21 1857  AST 29  ALT 26  ALKPHOS 43  BILITOT 0.1*  PROT 7.3  ALBUMIN 3.8   No results for input(s): LIPASE, AMYLASE in the last 168 hours. No results for input(s): AMMONIA in the last 168 hours. Coagulation Profile: No results for input(s): INR, PROTIME in the last 168 hours. Cardiac Enzymes: No results for input(s): CKTOTAL, CKMB, CKMBINDEX, TROPONINI in the  last 168 hours. BNP (last 3 results) No results for input(s): PROBNP in the last 8760 hours. HbA1C: No results for input(s): HGBA1C in the last 72 hours. CBG: No results for input(s): GLUCAP in the last 168 hours. Lipid Profile: No results for input(s): CHOL, HDL, LDLCALC, TRIG, CHOLHDL, LDLDIRECT in the last 72 hours. Thyroid Function Tests: No results for input(s): TSH, T4TOTAL, FREET4, T3FREE, THYROIDAB in the last 72 hours. Anemia Panel: No results for input(s): VITAMINB12, FOLATE, FERRITIN, TIBC, IRON, RETICCTPCT in the last 72 hours. Sepsis Labs: Recent Labs  Lab 04/13/21 1902 04/13/21 2234  LATICACIDVEN 2.4* 1.3    Recent Results (from the past 240  hour(s))  Resp Panel by RT-PCR (Flu A&B, Covid)     Status: None   Collection Time: 04/13/21  7:05 PM   Specimen: Nasopharyngeal(NP) swabs in vial transport medium  Result Value Ref Range Status   SARS Coronavirus 2 by RT PCR NEGATIVE NEGATIVE Final    Comment: (NOTE) SARS-CoV-2 target nucleic acids are NOT DETECTED.  The SARS-CoV-2 RNA is generally detectable in upper respiratory specimens during the acute phase of infection. The lowest concentration of SARS-CoV-2 viral copies this assay can detect is 138 copies/mL. A negative result does not preclude SARS-Cov-2 infection and should not be used as the sole basis for treatment or other patient management decisions. A negative result may occur with  improper specimen collection/handling, submission of specimen other than nasopharyngeal swab, presence of viral mutation(s) within the areas targeted by this assay, and inadequate number of viral copies(<138 copies/mL). A negative result must be combined with clinical observations, patient history, and epidemiological information. The expected result is Negative.  Fact Sheet for Patients:  BloggerCourse.com  Fact Sheet for Healthcare Providers:  SeriousBroker.it  This test  is no t yet approved or cleared by the [place] FDA and  has been authorized for detection and/or diagnosis of SARS-CoV-2 by FDA under an Emergency Use Authorization (EUA). This EUA will remain  in effect (meaning this test can be used) for the duration of the COVID-19 declaration under Section 564(b)(1) of the Act, 21 U.S.C.section 360bbb-3(b)(1), unless the authorization is terminated  or revoked sooner.       Influenza A by PCR NEGATIVE NEGATIVE Final   Influenza B by PCR NEGATIVE NEGATIVE Final    Comment: (NOTE) The Xpert Xpress SARS-CoV-2/FLU/RSV plus assay is intended as an aid in the diagnosis of influenza from Nasopharyngeal swab specimens and should not be used as a sole basis for treatment. Nasal washings and aspirates are unacceptable for Xpert Xpress SARS-CoV-2/FLU/RSV testing.  Fact Sheet for Patients: BloggerCourse.com  Fact Sheet for Healthcare Providers: SeriousBroker.it  This test is not yet approved or cleared by the [place] FDA and has been authorized for detection and/or diagnosis of SARS-CoV-2 by FDA under an Emergency Use Authorization (EUA). This EUA will remain in effect (meaning this test can be used) for the duration of the COVID-19 declaration under Section 564(b)(1) of the Act, 21 U.S.C. section 360bbb-3(b)(1), unless the authorization is terminated or revoked.  Performed at [facility], [address] [place], [place] [address]     Antimicrobials: Anti-infectives (From admission, onward)    None      Culture/Microbiology No results found for: SDES, SPECREQUEST, CULT, REPTSTATUS  Other culture-see note   Radiology Studies: CT Angio Chest PE W and/or Wo Contrast  Result Date: 04/13/2021 CLINICAL DATA:  Pulmonary embolism (PE) suspected, neg D-dimer EXAM: CT ANGIOGRAPHY CHEST WITH CONTRAST TECHNIQUE: Multidetector CT imaging of the chest was performed using the  standard protocol during bolus administration of intravenous contrast. Multiplanar CT image reconstructions and MIPs were obtained to evaluate the vascular anatomy. RADIATION DOSE REDUCTION: This exam was performed according to the departmental dose-optimization program which includes automated exposure control, adjustment of the mA and/or kV according to patient size and/or use of iterative reconstruction technique. CONTRAST:  OMNIPAQUE IOHEXOL 350 MG/ML SOLN COMPARISON:  None. FINDINGS: Cardiovascular: Mild motion artifact at the lung bases slightly limits the quality of the examination. The examination, however, is still diagnostic. No intraluminal filling defect is identified to suggest acute pulmonary embolism. Central pulmonary arteries are of  normal caliber. No significant coronary artery calcification. Global cardiac size within normal limits. No pericardial effusion. The thoracic aorta is unremarkable. Mediastinum/Nodes: No enlarged mediastinal, hilar, or axillary lymph nodes. Thyroid gland, trachea, and esophagus demonstrate no significant findings. Lungs/Pleura: Lungs are clear. No pleural effusion or pneumothorax. Upper Abdomen: No acute abnormality. Musculoskeletal: Osseous structures are age-appropriate. No acute bone abnormality. Review of the MIP images confirms the above findings. IMPRESSION: No pulmonary embolism.  No acute intrathoracic pathology identified. Electronically Signed   By: Helyn Numbers M.D.   On: 04/13/2021 22:32   DG Chest Portable 1 View  Result Date: 04/13/2021 CLINICAL DATA:  Short of breath, cough, hypoxia EXAM: PORTABLE CHEST 1 VIEW COMPARISON:  03/15/2021 FINDINGS: Single frontal view of the chest demonstrates an unremarkable cardiac silhouette. No airspace disease, effusion, or pneumothorax. No acute bony abnormalities. IMPRESSION: 1. No acute intrathoracic process. Electronically Signed   By: Sharlet Salina M.D.   On: 04/13/2021 19:53     LOS: 1 day   Lanae Boast, MD [facility]  04/14/2021, 8:55 AM

## 2021-04-14 NOTE — Assessment & Plan Note (Addendum)
Troponin borderline elevated.  Could be demand ischemia in the setting of hypoxia.

## 2021-04-14 NOTE — ED Notes (Signed)
Pt ambulatory to restroom without assistance 

## 2021-04-14 NOTE — ED Notes (Signed)
PT/OT at bedside.

## 2021-04-14 NOTE — Progress Notes (Signed)
Occupational Therapy Evaluation  Patient lives in apartment, works full time in [employer] and is independent at baseline. Patient demonstrates independence with ADLs and functional mobility. Does desat to 86% on RA in standing, donned 2L O2 and maintain 98-99% with ambulation. No further acute OT needs at this time, will sign off.    04/14/21 1400  OT Visit Information  Last OT Received On 04/14/21  Assistance Needed +1  History of Present Illness Pt admitted from home with c/o acute respiratory failure with hypoxia.  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  Pain Assessment  Pain Assessment No/denies pain  Cognition  Arousal/Alertness Awake/alert  Behavior During Therapy WFL for tasks assessed/performed  Overall Cognitive Status Within Functional Limits for tasks assessed  Upper Extremity Assessment  Upper Extremity Assessment Overall WFL for tasks assessed  Lower Extremity Assessment  Lower Extremity Assessment Defer to PT evaluation  Cervical / Trunk Assessment  Cervical / Trunk Assessment Normal  ADL  Overall ADL's  Independent  Bed Mobility  Overal bed mobility Modified Independent  Transfers  Overall transfer level Independent  Balance  Overall balance assessment Independent  General Comments  General comments (skin integrity, edema, etc.) Patient desat to 86% on room air standing, donned 2L and maintained 98-99% with  ambulation  OT - End of Session  Equipment Utilized During Treatment Oxygen  Activity Tolerance Patient tolerated treatment well  Patient left in bed;with call bell/phone within reach  Nurse Communication Mobility status  OT Assessment  OT Recommendation/Assessment Patient does not need any further OT services  OT Visit Diagnosis Other abnormalities of gait and mobility (R26.89)  OT Problem List Cardiopulmonary status limiting activity  AM-PAC OT "6 Clicks" Daily Activity Outcome Measure (Version 2)  Help from another person eating meals? 4  Help from another person taking care of personal grooming? 4  Help from another person toileting, which includes using toliet, bedpan, or urinal? 4  Help from another person bathing (including washing, rinsing, drying)? 4  Help from another person to put on and taking off regular upper body clothing? 4  Help from another person to put on and taking off regular lower body clothing? 4  6 Click Score 24  Progressive Mobility  What is the highest level of mobility based on the progressive mobility assessment? Level 6 (Walks independently in room and hall) - Balance while walking in room without assist - Complete  Activity Ambulated independently in hallway;Ambulated independently in room  OT Recommendation  Follow Up Recommendations No OT follow up  Assistance recommended at discharge None  Functional Status Assessent Patient has not had a recent decline in their functional status  OT Equipment None recommended by OT  Acute Rehab OT Goals  Patient Stated Goal Feel better  OT Goal Formulation All assessment and education complete, DC therapy  OT Time Calculation  OT Start Time (ACUTE ONLY) 1005  OT Stop Time (ACUTE ONLY) 1020  OT Time Calculation (min) 15 min  OT General Charges  $  OT Visit 1 Visit  OT Evaluation  $OT Eval Low Complexity 1 Low  Written Expression  Dominant Hand  (did not specify)    Marlyce Huge OT OT pager: [phone]

## 2021-04-14 NOTE — ED Notes (Signed)
Patient removed oxygen , pulse ox, BP cuff and monitor and was walking around the unit. Staff requested patient to return to room. O2 Sats 88%. Placed patient back on O2 2L/[place]. O2 sats improving

## 2021-04-14 NOTE — Hospital Course (Addendum)
Jesse Morris is a 41 y.o. male with PMH obesity and OSA who was recently admitted to [facility] in Jan with SOB due to Influenza A.  Not needing O2 at time of discharge. Since that admission he has had persistent SOB which worsened in last couple of days with severe dyspnea especially on exertion and has not felt better.  He continues to have severe daytime sleepiness he did have a positive sleep study and was diagnosed with sleep apnea by pulmonology approximately 1 year ago but never received CPAP machine. He was seen in the ED, 87% on room air, underwent further work-up with CTA no acute finding, BNP normal.  He underwent echo for further work-up on admission which showed EF 45 to 50% with global hypokinesis and moderate LVH. He was evaluated by cardiology and underwent right/left heart cath on 04/17/2021.  He had very minimal CAD (15% stenosis in mid LAD to distal LAD).  Mildly elevated LVEDP, normal PCWP, high cardiac output, borderline pulmonary hypertension.  He was recommended to continue on treatment for his underlying sleep apnea.

## 2021-04-15 DIAGNOSIS — I10 Essential (primary) hypertension: Secondary | ICD-10-CM

## 2021-04-15 DIAGNOSIS — R778 Other specified abnormalities of plasma proteins: Secondary | ICD-10-CM

## 2021-04-15 DIAGNOSIS — J9601 Acute respiratory failure with hypoxia: Principal | ICD-10-CM

## 2021-04-15 DIAGNOSIS — D649 Anemia, unspecified: Secondary | ICD-10-CM

## 2021-04-15 DIAGNOSIS — G4733 Obstructive sleep apnea (adult) (pediatric): Secondary | ICD-10-CM

## 2021-04-15 LAB — BASIC METABOLIC PANEL
Anion gap: 8 (ref 5–15)
BUN: 11 mg/dL (ref 6–20)
CO2: 30 mmol/L (ref 22–32)
Calcium: 8.5 mg/dL — ABNORMAL LOW (ref 8.9–10.3)
Chloride: 98 mmol/L (ref 98–111)
Creatinine, Ser: 0.84 mg/dL (ref 0.61–1.24)
GFR, Estimated: >60 mL/min (ref >60)
Glucose, Bld: 98 mg/dL (ref 70–99)
Potassium: 3.1 mmol/L — ABNORMAL LOW (ref 3.5–5.1)
Sodium: 136 mmol/L (ref 135–145)

## 2021-04-15 LAB — CBC WITH DIFFERENTIAL/PLATELET
Abs Immature Granulocytes: 0.05 10*3/uL (ref 0.00–0.07)
Basophils Absolute: 0 10*3/uL (ref 0.0–0.1)
Basophils Relative: 1 %
Eosinophils Absolute: 0.2 10*3/uL (ref 0.0–0.5)
Eosinophils Relative: 4 %
HCT: 36.5 % — ABNORMAL LOW (ref 39.0–52.0)
Hemoglobin: 11.3 g/dL — ABNORMAL LOW (ref 13.0–17.0)
Immature Granulocytes: 1 %
Lymphocytes Relative: 34 %
Lymphs Abs: 1.7 10*3/uL (ref 0.7–4.0)
MCH: 28.5 pg (ref 26.0–34.0)
MCHC: 31 g/dL (ref 30.0–36.0)
MCV: 91.9 fL (ref 80.0–100.0)
Monocytes Absolute: 0.5 10*3/uL (ref 0.1–1.0)
Monocytes Relative: 9 %
Neutro Abs: 2.6 10*3/uL (ref 1.7–7.7)
Neutrophils Relative %: 51 %
Platelets: 241 10*3/uL (ref 150–400)
RBC: 3.97 MIL/uL — ABNORMAL LOW (ref 4.22–5.81)
RDW: 15.1 % (ref 11.5–15.5)
WBC: 5.1 10*3/uL (ref 4.0–10.5)
nRBC: 0 % (ref 0.0–0.2)

## 2021-04-15 LAB — IRON AND TIBC
Iron: 70 ug/dL (ref 45–182)
Saturation Ratios: 13 % — ABNORMAL LOW (ref 17.9–39.5)
TIBC: 534 ug/dL — ABNORMAL HIGH (ref 250–450)
UIBC: 464 ug/dL

## 2021-04-15 LAB — LIPID PANEL
Cholesterol: 228 mg/dL — ABNORMAL HIGH (ref 0–200)
HDL: 60 mg/dL (ref >40)
LDL Cholesterol: 144 mg/dL — ABNORMAL HIGH (ref 0–99)
Total CHOL/HDL Ratio: 3.8 RATIO
Triglycerides: 121 mg/dL (ref <150)
VLDL: 24 mg/dL (ref 0–40)

## 2021-04-15 LAB — MAGNESIUM: Magnesium: 1.8 mg/dL (ref 1.7–2.4)

## 2021-04-15 LAB — FERRITIN: Ferritin: 8 ng/mL — ABNORMAL LOW (ref 24–336)

## 2021-04-15 LAB — VITAMIN B12: Vitamin B-12: 193 pg/mL (ref 180–914)

## 2021-04-15 LAB — FOLATE: Folate: 11.4 ng/mL (ref >5.9)

## 2021-04-15 MED ORDER — LORAZEPAM 2 MG/ML IJ SOLN
1.0000 mg | INTRAMUSCULAR | Status: AC | PRN
  Administered 2021-04-17: 2 mg via INTRAVENOUS
  Filled 2021-04-15: qty 1

## 2021-04-15 MED ORDER — THIAMINE HCL 100 MG PO TABS
100.0000 mg | ORAL_TABLET | Freq: Every day | ORAL | Status: DC
  Administered 2021-04-15 – 2021-04-19 (×4): 100 mg via ORAL
  Filled 2021-04-15 (×4): qty 1

## 2021-04-15 MED ORDER — FOLIC ACID 1 MG PO TABS
1.0000 mg | ORAL_TABLET | Freq: Every day | ORAL | Status: DC
  Administered 2021-04-15 – 2021-04-19 (×4): 1 mg via ORAL
  Filled 2021-04-15 (×4): qty 1

## 2021-04-15 MED ORDER — LORAZEPAM 1 MG PO TABS
1.0000 mg | ORAL_TABLET | ORAL | Status: AC | PRN
  Administered 2021-04-15 (×2): 1 mg via ORAL
  Filled 2021-04-15 (×2): qty 1
  Filled 2021-04-15: qty 2

## 2021-04-15 MED ORDER — THIAMINE HCL 100 MG/ML IJ SOLN
100.0000 mg | Freq: Every day | INTRAMUSCULAR | Status: DC
  Filled 2021-04-15: qty 2

## 2021-04-15 MED ORDER — LOSARTAN POTASSIUM 50 MG PO TABS
50.0000 mg | ORAL_TABLET | Freq: Every day | ORAL | Status: DC
  Administered 2021-04-15 – 2021-04-18 (×3): 50 mg via ORAL
  Filled 2021-04-15 (×3): qty 1

## 2021-04-15 MED ORDER — ADULT MULTIVITAMIN W/MINERALS CH
1.0000 | ORAL_TABLET | Freq: Every day | ORAL | Status: DC
  Administered 2021-04-15 – 2021-04-19 (×4): 1 via ORAL
  Filled 2021-04-15 (×4): qty 1

## 2021-04-15 MED ORDER — FUROSEMIDE 20 MG PO TABS
20.0000 mg | ORAL_TABLET | Freq: Every day | ORAL | Status: DC
  Administered 2021-04-15 – 2021-04-19 (×4): 20 mg via ORAL
  Filled 2021-04-15 (×4): qty 1

## 2021-04-15 NOTE — Plan of Care (Signed)

## 2021-04-15 NOTE — Consult Note (Signed)
CARDIOLOGY CONSULT NOTE       Patient ID: Hoan Rough MRN: [id number] DOB/AGE: [birth year] 41 y.o.  Admit date: 04/13/2021 Referring Physician: Maren Beach Primary Physician: Trey Sailors, PA Primary Cardiologist: New Reason for Consultation: Hypoxia  Principal Problem:   Acute respiratory failure with hypoxia [facility]) Active Problems:   OSA (obstructive sleep apnea)   HTN (hypertension)   Obesity, Class III, BMI 40-49.9 (morbid obesity) ([place])   Mixed hyperlipidemia   Hypokalemia   Elevated troponin   HPI:  41 y.o. Hispanic male from [place] admitted with dyspnea and hypoxia. Overweight with unRx OSA. 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 Dyspnea since January when admitted to [facility] with Influenza A No chest pain Sats drop with exertion and some tachypnea visibly on interview. TTE read by myself showed EF 45-50% mild LVE moderate LVH normal diastolic parameters Normal RV and no signs of pulmonary HTN  No obvious shunt but bubble study not done CTA this admission with no PE CXR NAD Walking in ER on no oxygen sats 88%   ROS All other systems reviewed and negative except as noted above  Past Medical History:  Diagnosis Date   Obesity     History reviewed. No pertinent family history.  Social History   Socioeconomic History   Marital status: Single    Spouse name: Not on file   Number of children: Not on file   Years of education: Not on file   Highest education level: Not on file  Occupational History   Not on file  Tobacco Use   Smoking status: Some Days   Smokeless tobacco: Never  Substance and Sexual Activity   Alcohol use: Yes   Drug use: Yes    Types: Marijuana   Sexual activity: Not on file  Other Topics Concern   Not on file  Social History  Narrative   Not on file   Social Determinants of Health   Financial Resource Strain: Not on file  Food Insecurity: Not on file  Transportation Needs: Not on file  Physical Activity: Not on file  Stress: Not on file  Social Connections: Not on file  Intimate Partner Violence: Not on file    History reviewed. No pertinent surgical history.    Current Facility-Administered Medications:    acetaminophen (TYLENOL) tablet 650 mg, 650 mg, Oral, Q6H PRN **OR** acetaminophen (TYLENOL) suppository 650 mg, 650 mg, Rectal, Q6H PRN, Alcario Drought, Jared M, DO   albuterol (PROVENTIL) (2.5 MG/3ML) 0.083% nebulizer solution 2.5 mg, 2.5 mg, Nebulization, Q2H PRN, Alcario Drought, Jared M, DO   enoxaparin (LOVENOX) injection 60 mg, 60 mg, Subcutaneous, Q24H, Gardner, Jared M, DO, 60 mg at [id number]   folic acid (FOLVITE) tablet 1 mg, 1 mg, Oral, Daily, Girguis, David, MD, 1 mg at 04/15/21 [id number]   furosemide (LASIX) injection 40 mg, 40 mg, Intravenous, Daily, Kc, Ramesh, MD, 40 mg at 04/15/21 0953   labetalol (NORMODYNE) injection 5-10 mg, 5-10 mg, Intravenous, Q2H PRN, Etta Quill, DO, 10 mg at 04/14/21 2031   LORazepam (ATIVAN) tablet 1-4 mg, 1-4 mg, Oral, Q1H PRN, 1 mg at 04/15/21 0952 **OR** LORazepam (ATIVAN) injection 1-4 mg, 1-4 mg, Intravenous, Q1H PRN, Dwyane Dee, MD   mometasone-formoterol (DULERA) 200-5 MCG/ACT inhaler 2 puff, 2 puff, Inhalation, BID, Etta Quill,  DO, 2 puff at 04/15/21 [id number]   multivitamin with minerals tablet 1 tablet, 1 tablet, Oral, Daily, Dwyane Dee, MD, 1 tablet at 04/15/21 0952   ondansetron (ZOFRAN) tablet 4 mg, 4 mg, Oral, Q6H PRN **OR** ondansetron (ZOFRAN) injection 4 mg, 4 mg, Intravenous, Q6H PRN, Alcario Drought, Jared M, DO   thiamine tablet 100 mg, 100 mg, Oral, Daily, 100 mg at 04/15/21 [id number] **OR** thiamine (B-1) injection 100 mg, 100 mg, Intravenous, Daily, Girguis, David, MD   umeclidinium bromide (INCRUSE ELLIPTA) 62.5 MCG/ACT 1 puff, 1 puff, Inhalation, Daily,  Alcario Drought, Jared M, DO, 1 puff at 04/15/21 0813  enoxaparin (LOVENOX) injection  60 mg Subcutaneous [id number]   folic acid  1 mg Oral Daily   furosemide  40 mg Intravenous Daily   mometasone-formoterol  2 puff Inhalation BID   multivitamin with minerals  1 tablet Oral Daily   thiamine  100 mg Oral Daily   Or   thiamine  100 mg Intravenous Daily   umeclidinium bromide  1 puff Inhalation Daily     Physical Exam: Blood pressure (!) 137/96, pulse 86, temperature 98.6 F (37 C), temperature source Oral, resp. rate 20, height 6' (1.829 m), weight (!) 145.3 kg, SpO2 96 %.    Affect appropriate Overweight Hispanic male  HEENT: normal Neck supple with no adenopathy JVP normal no bruits no thyromegaly Lungs clear with no wheezing and good diaphragmatic motion Heart:  S1/S2 no murmur, no rub, gallop or click PMI normal Abdomen: benighn, BS positve, no tenderness, no AAA no bruit.  No HSM or HJR Distal pulses intact with no bruits Trace bilateral edema Neuro non-focal Skin warm and dry No muscular weakness Tattoos including [place] on left shoulder    Labs:   Lab Results  Component Value Date   WBC 5.1 04/15/2021   HGB 11.3 (L) 04/15/2021   HCT 36.5 (L) 04/15/2021   MCV 91.9 04/15/2021   PLT 241 04/15/2021    Recent Labs  Lab 04/13/21 1857 04/13/21 1920 04/15/21 0956  NA 139   < > 136  K 3.5   < > 3.1*  CL 102   < > 98  CO2 29   < > 30  BUN 14   < > 11  CREATININE 0.98   < > 0.84  CALCIUM 8.8*   < > 8.5*  PROT 7.3  --   --   BILITOT 0.1*  --   --   ALKPHOS 43  --   --   ALT 26  --   --   AST 29  --   --   GLUCOSE 118*   < > 98   < > = values in this interval not displayed.   No results found for: CKTOTAL, CKMB, CKMBINDEX, TROPONINI No results found for: CHOL No results found for: HDL No results found for: LDLCALC No results found for: TRIG No results found for: CHOLHDL No results found for: LDLDIRECT    Radiology: CT Angio Chest PE W and/or Wo  Contrast  Result Date: 04/13/2021 CLINICAL DATA:  Pulmonary embolism (PE) suspected, neg D-dimer EXAM: CT ANGIOGRAPHY CHEST WITH CONTRAST TECHNIQUE: Multidetector CT imaging of the chest was performed using the standard protocol during bolus administration of intravenous contrast. Multiplanar CT image reconstructions and MIPs were obtained to evaluate the vascular anatomy. RADIATION DOSE REDUCTION: This exam was performed according to the departmental dose-optimization program which includes automated exposure control, adjustment of the mA and/or kV according to patient size and/or use  of iterative reconstruction technique. CONTRAST:  141mL OMNIPAQUE IOHEXOL 350 MG/ML SOLN COMPARISON:  None. FINDINGS: Cardiovascular: Mild motion artifact at the lung bases slightly limits the quality of the examination. The examination, however, is still diagnostic. No intraluminal filling defect is identified to suggest acute pulmonary embolism. Central pulmonary arteries are of normal caliber. No significant coronary artery calcification. Global cardiac size within normal limits. No pericardial effusion. The thoracic aorta is unremarkable. Mediastinum/Nodes: No enlarged mediastinal, hilar, or axillary lymph nodes. Thyroid gland, trachea, and esophagus demonstrate no significant findings. Lungs/Pleura: Lungs are clear. No pleural effusion or pneumothorax. Upper Abdomen: No acute abnormality. Musculoskeletal: Osseous structures are age-appropriate. No acute bone abnormality. Review of the MIP images confirms the above findings. IMPRESSION: No pulmonary embolism.  No acute intrathoracic pathology identified. Electronically Signed   By: Fidela Salisbury M.D.   On: 04/13/2021 22:32   DG Chest Portable 1 View  Result Date: 04/13/2021 CLINICAL DATA:  Short of breath, cough, hypoxia EXAM: PORTABLE CHEST 1 VIEW COMPARISON:  03/15/2021 FINDINGS: Single frontal view of the chest demonstrates an unremarkable cardiac silhouette. No airspace  disease, effusion, or pneumothorax. No acute bony abnormalities. IMPRESSION: 1. No acute intrathoracic process. Electronically Signed   By: Randa Ngo M.D.   On: 04/13/2021 19:53   ECHOCARDIOGRAM COMPLETE  Result Date: 04/14/2021    ECHOCARDIOGRAM REPORT   Patient Name:   JACIEON SHORTINO Date of Exam: 04/14/2021 Medical Rec #:  [id number]    Height:       72.0 in Accession #:    [id number]   Weight:       320.0 lb Date of Birth:  10/27/1980    BSA:          35.602 m Patient Age:    73 years     BP:           140/95 mmHg Patient Gender: M            HR:           80 bpm. Exam Location:  Inpatient Procedure: 2D Echo Indications:    Dyspnea  History:        Patient has no prior history of Echocardiogram examinations.                 Risk Factors:Sleep Apnea.  Sonographer:    Jefferey Pica Referring Phys: [place]  1. Global hypokinesis worse in inferior base . Left ventricular ejection fraction, by estimation, is 45 to 50%. The left ventricle has mildly decreased function. The left ventricle demonstrates global hypokinesis. The left ventricular internal cavity size was mildly dilated. There is moderate left ventricular hypertrophy. Left ventricular diastolic parameters were normal.  2. Right ventricular systolic function is normal. The right ventricular size is normal.  3. Left atrial size was moderately dilated.  4. The mitral valve is normal in structure. No evidence of mitral valve regurgitation. No evidence of mitral stenosis.  5. The aortic valve is tricuspid. Aortic valve regurgitation is not visualized. No aortic stenosis is present.  6. Aortic dilatation noted. There is mild dilatation of the ascending aorta, measuring 41 mm.  7. The inferior vena cava is normal in size with greater than 50% respiratory variability, suggesting right atrial pressure of 3 mmHg. FINDINGS  Left Ventricle: Global hypokinesis worse in inferior base. Left ventricular ejection fraction, by estimation, is 45  to 50%. The left ventricle has mildly decreased function. The left ventricle demonstrates global hypokinesis. The left ventricular internal  cavity size was mildly dilated. There is moderate left ventricular hypertrophy. Left ventricular diastolic parameters were normal. Right Ventricle: The right ventricular size is normal. No increase in right ventricular wall thickness. Right ventricular systolic function is normal. Left Atrium: Left atrial size was moderately dilated. Right Atrium: Right atrial size was normal in size. Pericardium: There is no evidence of pericardial effusion. Mitral Valve: The mitral valve is normal in structure. No evidence of mitral valve regurgitation. No evidence of mitral valve stenosis. Tricuspid Valve: The tricuspid valve is normal in structure. Tricuspid valve regurgitation is not demonstrated. No evidence of tricuspid stenosis. Aortic Valve: The aortic valve is tricuspid. Aortic valve regurgitation is not visualized. No aortic stenosis is present. Aortic valve peak gradient measures 5.5 mmHg. Pulmonic Valve: The pulmonic valve was normal in structure. Pulmonic valve regurgitation is not visualized. No evidence of pulmonic stenosis. Aorta: Aortic dilatation noted. There is mild dilatation of the ascending aorta, measuring 41 mm. Venous: The inferior vena cava is normal in size with greater than 50% respiratory variability, suggesting right atrial pressure of 3 mmHg. IAS/Shunts: No atrial level shunt detected by color flow Doppler.  LEFT VENTRICLE PLAX 2D LVIDd:         5.30 cm      Diastology LVIDs:         4.80 cm      LV e' lateral:   9.68 cm/s LV PW:         1.40 cm      LV E/e' lateral: 6.7 LV IVS:        1.50 cm LVOT diam:     2.60 cm LV SV:         139 LV SV Index:   53 LVOT Area:     5.31 cm  LV Volumes (MOD) LV vol d, MOD A4C: 250.5 ml LV vol s, MOD A4C: 179.0 ml LV SV MOD A4C:     250.5 ml RIGHT VENTRICLE             IVC RV S prime:     10.40 cm/s  IVC diam: 2.40 cm LEFT ATRIUM              Index        RIGHT ATRIUM           Index LA diam:        4.70 cm 1.81 cm/m   RA Area:     11.90 cm LA Vol (A2C):   83.9 ml 32.25 ml/m  RA Volume:   25.80 ml  9.92 ml/m LA Vol (A4C):   92.4 ml 35.52 ml/m LA Biplane Vol: 88.4 ml 33.98 ml/m  AORTIC VALVE                 PULMONIC VALVE AV Area (Vmax): 6.13 cm     PV Vmax:       0.82 m/s AV Vmax:        117.00 cm/s  PV Peak grad:  2.7 mmHg AV Peak Grad:   5.5 mmHg LVOT Vmax:      135.00 cm/s LVOT Vmean:     85.000 cm/s LVOT VTI:       0.262 m  AORTA Ao Root diam: 4.10 cm Ao Asc diam:  4.10 cm MITRAL VALVE MV Area (PHT): 3.27 cm    SHUNTS MV Decel Time: 232 msec    Systemic VTI:  0.26 m MV E velocity: 64.60 cm/s  Systemic Diam: 2.60 cm MV A velocity: 81.10  cm/s MV E/A ratio:  0.80 Jenkins Rouge MD Electronically signed by Jenkins Rouge MD Signature Date/Time: 04/14/2021/2:58:28 PM    Final     EKG: SR rate 92 LAE no signs RV strain   ASSESSMENT AND PLAN:   Dyspnea:  likely multifactorial. OSA obesity hypoventilation CTA no PE. 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 Echo with no signs of pulmonary HTN. 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 No defined RWMA mild decrease in function EF 45-50% with moderate LVH and ECG with LAE. BNP normal and CXR no CHF can decrease dose of lasix. Increased ARB Losartan 50 mg daily See above left heart cath to assess EDP and r/o CAD Risks of cath including stroke, bleeding , intubation surgery and contrast reaction discussed willing to proceed Will try to place on schedule Monday afternoon Orders written HTN see above may need the addition of bidil depending on response to oral lasix and ARB   Signed: Jenkins Rouge 04/15/2021, 12:09 PM

## 2021-04-15 NOTE — Assessment & Plan Note (Addendum)
-  LDL 144 - ASCVD max 4-5%. Will defer need for statin to cardiology. Minimal nonobs CAD on cath

## 2021-04-15 NOTE — Assessment & Plan Note (Addendum)
-   baseline appears to be approximately 11-12 g/dL - currently at baseline - trend for now - B12 low, supplementation started

## 2021-04-15 NOTE — Progress Notes (Signed)
Progress Note   Patient: Jesse Morris [id number] DOB: 07/13/1980 DOA: 04/13/2021     2 DOS: the patient was seen and examined on 04/15/2021   Brief hospital course: Jesse Morris is a 41 y.o. male with PMH obesity and OSA who was recently admitted to [facility] in Jan with SOB due to Influenza A.  Not needing O2 at time of discharge. Since that admission he has had persistent SOB which worsened in last couple of days with severe dyspnea especially on exertion and has not felt better.  He continues to have severe daytime sleepiness he did have a positive sleep study and was diagnosed with sleep apnea by pulmonology approximately 1 year ago but never received CPAP machine. He was seen in the ED, 87% on room air, underwent further work-up with CTA no acute finding, BNP normal.  He underwent echo for further work-up on admission which showed EF 45 to 50% with global hypokinesis and moderate LVH.  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  Apparently has had difficulty obtaining home CPAP -He does endorse that he sleeps better when using CPAP in the hospital - Will follow-up with case management prior to discharge for CPAP status  Elevated troponin Troponin borderline elevated.  Could be demand ischemia in the setting of hypoxia. - follow up heart cath results planned for Monday   Normocytic anemia - baseline appears to be approximately 11-12 g/dL - currently at baseline; no overt renal dysfunction - trend for now  Hypokalemia - Replete as  needed  Mixed hyperlipidemia- (present on admission) - check lipid panel   Obesity, Class III, BMI 40-49.9 (morbid obesity) (HCC) - Complicates overall prognosis and care - Body mass index is 43.44 kg/m. - Weight Loss and Dietary Counseling given  HTN (hypertension)- (present on admission) - continue losartan; adjusted per cardiology       Subjective: Seen this morning in his room.  He was sitting on the edge of bed comfortably eating lunch.  Tentative plan is for heart catheterization on Monday.  He endorses that he does sleep better when he is wearing CPAP.  Appears that he may have fallen through some sort of gap with not getting his home CPAP machine but we will need to clarify all of this further on Monday with the help of case management.  Physical Exam: Vitals:   04/15/21 1000 04/15/21 1132 04/15/21 1157 04/15/21 1200  BP:   (!) 137/96 (!) 144/98  Pulse:   86 89  Resp: (!) 32 (!) 26 20 20   Temp:   98.6 F (37 C) 99.2 F (37.3 C)  TempSrc:   Oral Oral  SpO2:   96% 92%  Weight:      Height:      Physical Exam Constitutional:      General: He is not in acute distress.    Appearance: Normal appearance. He is obese.  HENT:     Head: Normocephalic and atraumatic.     Mouth/Throat:     Mouth: Mucous membranes are moist.  Eyes:     Extraocular Movements:  Extraocular movements intact.  Cardiovascular:     Rate and Rhythm: Normal rate and regular rhythm.     Heart sounds: Normal heart sounds.  Pulmonary:     Effort: Pulmonary effort is normal. No respiratory distress.     Breath sounds: Normal breath sounds. No wheezing.  Abdominal:     General: Bowel sounds are normal. There is no distension.     Palpations: Abdomen is soft.     Tenderness: There is no abdominal tenderness.  Musculoskeletal:        General: Normal range of motion.     Cervical back: Normal range of motion and neck supple.  Skin:    General: Skin is warm and dry.  Neurological:     General: No  focal deficit present.     Mental Status: He is alert.  Psychiatric:        Mood and Affect: Mood normal.        Behavior: Behavior normal.   Data Reviewed:  I have Reviewed nursing notes, Vitals, and Lab results since pt's last encounter. Pertinent lab results K 3.1, Hgb 11.3 I have ordered test including BMP, CBC, Mg I have reviewed the last note from all staff from past 24 hours,  I have discussed pt's care plan and test results with nursing staff, CM.   Family Communication: mother  Disposition: Status is: Inpatient Remains inpatient appropriate because: abnormal echo workup, hypoxia     Planned Discharge Destination: Home      Author: Lewie Chamber, MD 04/15/2021 3:25 PM  For on call review [email or web address].

## 2021-04-15 NOTE — Progress Notes (Signed)
°   04/15/21 0806  CIWA-Ar  BP (!) 145/95  Pulse Rate 80  Nausea and Vomiting 0  Tactile Disturbances 0  Tremor 2  Auditory Disturbances 0  Paroxysmal Sweats 2 (when sleeping)  Visual Disturbances 0  Anxiety 4 (prevent from sleeping)  Headache, Fullness in Head 2 (back of head)  Agitation 4  Orientation and Clouding of Sensorium 0  CIWA-Ar Total 14

## 2021-04-16 ENCOUNTER — Inpatient Hospital Stay (HOSPITAL_COMMUNITY): Payer: BC Managed Care – PPO

## 2021-04-16 DIAGNOSIS — R0902 Hypoxemia: Secondary | ICD-10-CM

## 2021-04-16 DIAGNOSIS — J9601 Acute respiratory failure with hypoxia: Secondary | ICD-10-CM | POA: Diagnosis not present

## 2021-04-16 LAB — CBC WITH DIFFERENTIAL/PLATELET
Abs Immature Granulocytes: 0.01 10*3/uL (ref 0.00–0.07)
Basophils Absolute: 0.1 10*3/uL (ref 0.0–0.1)
Basophils Relative: 1 %
Eosinophils Absolute: 0.3 10*3/uL (ref 0.0–0.5)
Eosinophils Relative: 5 %
HCT: 38.4 % — ABNORMAL LOW (ref 39.0–52.0)
Hemoglobin: 12 g/dL — ABNORMAL LOW (ref 13.0–17.0)
Immature Granulocytes: 0 %
Lymphocytes Relative: 33 %
Lymphs Abs: 1.8 10*3/uL (ref 0.7–4.0)
MCH: 28.7 pg (ref 26.0–34.0)
MCHC: 31.3 g/dL (ref 30.0–36.0)
MCV: 91.9 fL (ref 80.0–100.0)
Monocytes Absolute: 0.5 10*3/uL (ref 0.1–1.0)
Monocytes Relative: 9 %
Neutro Abs: 2.8 10*3/uL (ref 1.7–7.7)
Neutrophils Relative %: 52 %
Platelets: 267 10*3/uL (ref 150–400)
RBC: 4.18 MIL/uL — ABNORMAL LOW (ref 4.22–5.81)
RDW: 15.5 % (ref 11.5–15.5)
WBC: 5.5 10*3/uL (ref 4.0–10.5)
nRBC: 0 % (ref 0.0–0.2)

## 2021-04-16 LAB — BASIC METABOLIC PANEL
Anion gap: 8 (ref 5–15)
BUN: 14 mg/dL (ref 6–20)
CO2: 32 mmol/L (ref 22–32)
Calcium: 8.9 mg/dL (ref 8.9–10.3)
Chloride: 98 mmol/L (ref 98–111)
Creatinine, Ser: 1.04 mg/dL (ref 0.61–1.24)
GFR, Estimated: >60 mL/min (ref >60)
Glucose, Bld: 99 mg/dL (ref 70–99)
Potassium: 3.4 mmol/L — ABNORMAL LOW (ref 3.5–5.1)
Sodium: 138 mmol/L (ref 135–145)

## 2021-04-16 LAB — GLUCOSE, CAPILLARY: Glucose-Capillary: 84 mg/dL (ref 70–99)

## 2021-04-16 LAB — MAGNESIUM: Magnesium: 2.1 mg/dL (ref 1.7–2.4)

## 2021-04-16 MED ORDER — SODIUM CHLORIDE 0.9% FLUSH
3.0000 mL | Freq: Two times a day (BID) | INTRAVENOUS | Status: DC
  Administered 2021-04-16: 3 mL via INTRAVENOUS

## 2021-04-16 MED ORDER — SODIUM CHLORIDE 0.9 % WEIGHT BASED INFUSION
3.0000 mL/kg/h | INTRAVENOUS | Status: AC
  Administered 2021-04-17: 3 mL/kg/h via INTRAVENOUS

## 2021-04-16 MED ORDER — SODIUM CHLORIDE 0.9% FLUSH: 3.0000 mL | INTRAVENOUS | Status: DC | PRN

## 2021-04-16 MED ORDER — ISOSORB DINITRATE-HYDRALAZINE 20-37.5 MG PO TABS
1.0000 | ORAL_TABLET | Freq: Three times a day (TID) | ORAL | Status: DC
  Administered 2021-04-16 – 2021-04-18 (×6): 1 via ORAL
  Filled 2021-04-16 (×9): qty 1

## 2021-04-16 MED ORDER — FERROUS SULFATE 325 (65 FE) MG PO TABS
325.0000 mg | ORAL_TABLET | Freq: Every day | ORAL | Status: DC
  Administered 2021-04-16 – 2021-04-19 (×3): 325 mg via ORAL
  Filled 2021-04-16 (×3): qty 1

## 2021-04-16 MED ORDER — POTASSIUM CHLORIDE CRYS ER 20 MEQ PO TBCR
40.0000 meq | EXTENDED_RELEASE_TABLET | Freq: Once | ORAL | Status: AC
Start: 2021-04-16 — End: 2021-04-16
  Administered 2021-04-16: 40 meq via ORAL
  Filled 2021-04-16: qty 2

## 2021-04-16 MED ORDER — ASPIRIN 81 MG PO CHEW
81.0000 mg | CHEWABLE_TABLET | ORAL | Status: AC
  Administered 2021-04-17: 81 mg via ORAL
  Filled 2021-04-16: qty 1

## 2021-04-16 MED ORDER — VITAMIN B-12 1000 MCG PO TABS
1000.0000 ug | ORAL_TABLET | Freq: Every day | ORAL | Status: DC
  Administered 2021-04-16 – 2021-04-19 (×3): 1000 ug via ORAL
  Filled 2021-04-16 (×3): qty 1

## 2021-04-16 MED ORDER — SODIUM CHLORIDE 0.9 % IV SOLN: 250.0000 mL | INTRAVENOUS | Status: DC | PRN

## 2021-04-16 MED ORDER — SODIUM CHLORIDE 0.9 % WEIGHT BASED INFUSION
1.0000 mL/kg/h | INTRAVENOUS | Status: DC
  Administered 2021-04-17: 1 mL/kg/h via INTRAVENOUS

## 2021-04-16 NOTE — H&P (View-Only) (Signed)
Cardiology:  Jesse Morris  Subjective:  Still with some dyspnea   Objective:  Vitals:   04/15/21 2153 04/16/21 0418 04/16/21 0834 04/16/21 0835  BP: (!) 150/89 (!) 156/103    Pulse: 86 89    Resp:      Temp: 98.3 F (36.8 C) 98.7 F (37.1 C)    TempSrc: Oral Oral    SpO2:   96% 95%  Weight:      Height:        Intake/Output from previous day:  Intake/Output Summary (Last 24 hours) at 04/16/2021 1013 Last data filed at 04/16/2021 0400 Gross per 24 hour  Intake 600 ml  Output 2900 ml  Net -2300 ml    Physical Exam: Overweight Hispanic male Tattoos including [place] [employer] on left shoulder Basilar crackles  No murmur  Abdomen benign Plus one LE edema  Lab Results: Basic Metabolic Panel: Recent Labs    04/15/21 0956 04/16/21 0417  NA 136 138  K 3.1* 3.4*  CL 98 98  CO2 30 32  GLUCOSE 98 99  BUN 11 14  CREATININE 0.84 1.04  CALCIUM 8.5* 8.9  MG 1.8 2.1   Liver Function Tests: Recent Labs    04/13/21 1857  AST 29  ALT 26  ALKPHOS 43  BILITOT 0.1*  PROT 7.3  ALBUMIN 3.8   No results for input(s): LIPASE, AMYLASE in the last 72 hours. CBC: Recent Labs    04/15/21 0956 04/16/21 0417  WBC 5.1 5.5  NEUTROABS 2.6 2.8  HGB 11.3* 12.0*  HCT 36.5* 38.4*  MCV 91.9 91.9  PLT 241 267   Cardiac Enzymes: No results for input(s): CKTOTAL, CKMB, CKMBINDEX, TROPONINI in the last 72 hours. BNP: Invalid input(s): POCBNP D-Dimer: Recent Labs    04/13/21 1857  DDIMER 0.48   Hemoglobin A1C: No results for input(s): HGBA1C in the last 72 hours. Fasting Lipid Panel: Recent Labs    04/15/21 0947  CHOL 228*  HDL 60  LDLCALC 144*  TRIG 121  CHOLHDL 3.8   Thyroid Function Tests: No results for input(s): TSH, T4TOTAL, T3FREE, THYROIDAB in the last 72 hours.  Invalid input(s): FREET3 Anemia Panel: Recent Labs    04/15/21 1651  VITAMINB12 193  FOLATE 11.4  FERRITIN 8*  TIBC 534*  IRON 70    Imaging: ECHOCARDIOGRAM COMPLETE  Result Date: 04/14/2021     ECHOCARDIOGRAM REPORT   Patient Name:   Jesse Morris Date of Exam: 04/14/2021 Medical Rec #:  [id number]    Height:       72.0 in Accession #:    [id number]   Weight:       320.0 lb Date of Birth:  12/12/1980    BSA:          2.602 m Patient Age:    40 years     BP:           140/95 mmHg Patient Gender: M            HR:           80 bpm. Exam Location:  Inpatient Procedure: 2D Echo Indications:    Dyspnea  History:        Patient has no prior history of Echocardiogram examinations.                 Risk Factors:Sleep Apnea.  Sonographer:    Eduard Roux Referring Phys: [phone] JARED M GARDNER IMPRESSIONS  1. Global hypokinesis worse in inferior base . Left ventricular ejection fraction,  by estimation, is 45 to 50%. The left ventricle has mildly decreased function. The left ventricle demonstrates global hypokinesis. The left ventricular internal cavity size was mildly dilated. There is moderate left ventricular hypertrophy. Left ventricular diastolic parameters were normal.  2. Right ventricular systolic function is normal. The right ventricular size is normal.  3. Left atrial size was moderately dilated.  4. The mitral valve is normal in structure. No evidence of mitral valve regurgitation. No evidence of mitral stenosis.  5. The aortic valve is tricuspid. Aortic valve regurgitation is not visualized. No aortic stenosis is present.  6. Aortic dilatation noted. There is mild dilatation of the ascending aorta, measuring 41 mm.  7. The inferior vena cava is normal in size with greater than 50% respiratory variability, suggesting right atrial pressure of 3 mmHg. FINDINGS  Left Ventricle: Global hypokinesis worse in inferior base. Left ventricular ejection fraction, by estimation, is 45 to 50%. The left ventricle has mildly decreased function. The left ventricle demonstrates global hypokinesis. The left ventricular internal cavity size was mildly dilated. There is moderate left ventricular hypertrophy. Left ventricular  diastolic parameters were normal. Right Ventricle: The right ventricular size is normal. No increase in right ventricular wall thickness. Right ventricular systolic function is normal. Left Atrium: Left atrial size was moderately dilated. Right Atrium: Right atrial size was normal in size. Pericardium: There is no evidence of pericardial effusion. Mitral Valve: The mitral valve is normal in structure. No evidence of mitral valve regurgitation. No evidence of mitral valve stenosis. Tricuspid Valve: The tricuspid valve is normal in structure. Tricuspid valve regurgitation is not demonstrated. No evidence of tricuspid stenosis. Aortic Valve: The aortic valve is tricuspid. Aortic valve regurgitation is not visualized. No aortic stenosis is present. Aortic valve peak gradient measures 5.5 mmHg. Pulmonic Valve: The pulmonic valve was normal in structure. Pulmonic valve regurgitation is not visualized. No evidence of pulmonic stenosis. Aorta: Aortic dilatation noted. There is mild dilatation of the ascending aorta, measuring 41 mm. Venous: The inferior vena cava is normal in size with greater than 50% respiratory variability, suggesting right atrial pressure of 3 mmHg. IAS/Shunts: No atrial level shunt detected by color flow Doppler.  LEFT VENTRICLE PLAX 2D LVIDd:         5.30 cm      Diastology LVIDs:         4.80 cm      LV e' lateral:   9.68 cm/s LV PW:         1.40 cm      LV E/e' lateral: 6.7 LV IVS:        1.50 cm LVOT diam:     2.60 cm LV SV:         139 LV SV Index:   53 LVOT Area:     5.31 cm  LV Volumes (MOD) LV vol d, MOD A4C: 250.5 ml LV vol s, MOD A4C: 179.0 ml LV SV MOD A4C:     250.5 ml RIGHT VENTRICLE             IVC RV S prime:     10.40 cm/s  IVC diam: 2.40 cm LEFT ATRIUM             Index        RIGHT ATRIUM           Index LA diam:        4.70 cm 1.81 cm/m   RA Area:     11.90 cm LA Vol (  A2C):   83.9 ml 32.25 ml/m  RA Volume:   25.80 ml  9.92 ml/m LA Vol (A4C):   92.4 ml 35.52 ml/m LA Biplane Vol:  88.4 ml 33.98 ml/m  AORTIC VALVE                 PULMONIC VALVE AV Area (Vmax): 6.13 cm     PV Vmax:       0.82 m/s AV Vmax:        117.00 cm/s  PV Peak grad:  2.7 mmHg AV Peak Grad:   5.5 mmHg LVOT Vmax:      135.00 cm/s LVOT Vmean:     85.000 cm/s LVOT VTI:       0.262 m  AORTA Ao Root diam: 4.10 cm Ao Asc diam:  4.10 cm MITRAL VALVE MV Area (PHT): 3.27 cm    SHUNTS MV Decel Time: 232 msec    Systemic VTI:  0.26 m MV E velocity: 64.60 cm/s  Systemic Diam: 2.60 cm MV A velocity: 81.10 cm/s MV E/A ratio:  0.80 Charlton Haws MD Electronically signed by Charlton Haws MD Signature Date/Time: 04/14/2021/2:58:28 PM    Final    ECHOCARDIOGRAM LIMITED BUBBLE STUDY  Result Date: 04/16/2021    ECHOCARDIOGRAM LIMITED REPORT   Patient Name:   Jesse Morris Date of Exam: 04/16/2021 Medical Rec #:  [id number]    Height:       72.0 in Accession #:    [id number]   Weight:       315.3 lb Date of Birth:  [birth year]    BSA:          2.585 m Patient Age:    40 years     BP:           156/103 mmHg Patient Gender: M            HR:           77 bpm. Exam Location:  Inpatient Procedure: 2D Echo and Limited Echo Indications:    Hypoxia  History:        Patient has prior history of Echocardiogram examinations, most                 recent 04/14/2020. Risk Factors:Hypertension and Sleep Apnea.  Sonographer:    Eduard Roux Referring Phys: [id number] Wendall Stade  Sonographer Comments: Image acquisition challenging due to patient body habitus. IMPRESSIONS  1. Negative bubble study supine and upright No evidence of platyonea orthodeoxia Mild LVE moderate LVH EF 45-50%. FINDINGS  Additional Comments: Negative bubble study supine and upright No evidence of platyonea orthodeoxia Mild LVE moderate LVH EF 45-50%. Charlton Haws MD Electronically signed by Charlton Haws MD Signature Date/Time: 04/16/2021/10:50:54 AM    Final     Cardiac Studies:  ECG: SR rate 92 LAE no signs RV strain  Telemetry:  NSR 04/16/2021   Echo: EF 45-50% moderate LVH RV normal no  signs pulmonary HTN Bubble study pending   Medications:    enoxaparin (LOVENOX) injection  60 mg Subcutaneous Q24H   ferrous sulfate  325 mg Oral Q breakfast   folic acid  1 mg Oral Daily   furosemide  20 mg Oral Daily   losartan  50 mg Oral Daily   mometasone-formoterol  2 puff Inhalation BID   multivitamin with minerals  1 tablet Oral Daily   potassium chloride  40 mEq Oral Once   thiamine  100 mg Oral Daily   Or   thiamine  100 mg Intravenous Daily   umeclidinium bromide  1 puff Inhalation Daily   vitamin B-12  1,000 mcg Oral Daily      Assessment/Plan:  Dyspnea:  likely multifactorial. OSA obesity hypoventilation CTA no PE. 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 Echo with no signs of pulmonary HTN. 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 No defined RWMA mild decrease in function EF 45-50% with moderate LVH and ECG with LAE. BNP normal and CXR no CHF can decrease dose of lasix. Increased ARB Losartan 50 mg daily See above left heart cath to assess EDP and r/o CAD Risks of cath including stroke, bleeding , intubation surgery and contrast reaction discussed willing to proceed Will try to place on schedule Monday afternoon Orders written HTN see above add bidil to lasix and ARB supplement K  Charlton Haws 04/16/2021, 10:13 AM

## 2021-04-16 NOTE — Progress Notes (Signed)
Echocardiogram 2D Echocardiogram has been performed.  Jesse Morris 04/16/2021, 8:31 AM

## 2021-04-16 NOTE — Progress Notes (Signed)
° °Cardiology:  Vondra Aldredge ° °Subjective:  °Still with some dyspnea  ° °Objective:  °Vitals:  ° 04/15/21 2153 04/16/21 0418 04/16/21 0834 04/16/21 0835  °BP: (!) 150/89 (!) 156/103    °Pulse: 86 89    °Resp:      °Temp: 98.3 °F (36.8 °C) 98.7 °F (37.1 °C)    °TempSrc: Oral Oral    °SpO2:   96% 95%  °Weight:      °Height:      ° ° °Intake/Output from previous day: ° °Intake/Output Summary (Last 24 hours) at 04/16/2021 1013 °Last data filed at 04/16/2021 0400 °Gross per 24 hour  °Intake 600 ml  °Output 2900 ml  °Net -2300 ml  ° ° °Physical Exam: °Overweight Hispanic male °Tattoos including [place] on left shoulder °Basilar crackles  °No murmur  °Abdomen benign °Plus one LE edema ° °Lab Results: °Basic Metabolic Panel: °Recent Labs  °  04/15/21 °0956 04/16/21 °0417  °NA 136 138  °K 3.1* 3.4*  °CL 98 98  °CO2 30 32  °GLUCOSE 98 99  °BUN 11 14  °CREATININE 0.84 1.04  °CALCIUM 8.5* 8.9  °MG 1.8 2.1  ° °Liver Function Tests: °Recent Labs  °  04/13/21 °1857  °AST 29  °ALT 26  °ALKPHOS 43  °BILITOT 0.1*  °PROT 7.3  °ALBUMIN 3.8  ° °No results for input(s): LIPASE, AMYLASE in the last 72 hours. °CBC: °Recent Labs  °  04/15/21 °0956 04/16/21 °0417  °WBC 5.1 5.5  °NEUTROABS 2.6 2.8  °HGB 11.3* 12.0*  °HCT 36.5* 38.4*  °MCV 91.9 91.9  °PLT 241 267  ° °Cardiac Enzymes: °No results for input(s): CKTOTAL, CKMB, CKMBINDEX, TROPONINI in the last 72 hours. °BNP: °Invalid input(s): POCBNP °D-Dimer: °Recent Labs  °  04/13/21 °1857  °DDIMER 0.48  ° °Hemoglobin A1C: °No results for input(s): HGBA1C in the last 72 hours. °Fasting Lipid Panel: °Recent Labs  °  04/15/21 °0947  °CHOL 228*  °HDL 60  °LDLCALC 144*  °TRIG 121  °CHOLHDL 3.8  ° °Thyroid Function Tests: °No results for input(s): TSH, T4TOTAL, T3FREE, THYROIDAB in the last 72 hours. ° °Invalid input(s): FREET3 °Anemia Panel: °Recent Labs  °  04/15/21 °1651  °VITAMINB12 193  °FOLATE 11.4  °FERRITIN 8*  °TIBC 534*  °IRON 70  ° ° °Imaging: °ECHOCARDIOGRAM COMPLETE ° °Result Date: 04/14/2021 °    ECHOCARDIOGRAM REPORT   Patient Name:   Traivon Escudero Date of Exam: 04/14/2021 Medical Rec #:  [id number]    Height:       72.0 in Accession #:    [id number]   Weight:       320.0 lb Date of Birth:  11/15/1980    BSA:          2.602 m² Patient Age:    41 years     BP:           140/95 mmHg Patient Gender: M            HR:           80 bpm. Exam Location:  Inpatient Procedure: 2D Echo Indications:    Dyspnea  History:        Patient has no prior history of Echocardiogram examinations.                 Risk Factors:Sleep Apnea.  Sonographer:    Colleen Schwartz Referring Phys: [id number] JARED M GARDNER IMPRESSIONS  1. Global hypokinesis worse in inferior base . Left ventricular ejection fraction,   by estimation, is 45 to 50%. The left ventricle has mildly decreased function. The left ventricle demonstrates global hypokinesis. The left ventricular internal cavity size was mildly dilated. There is moderate left ventricular hypertrophy. Left ventricular diastolic parameters were normal.  2. Right ventricular systolic function is normal. The right ventricular size is normal.  3. Left atrial size was moderately dilated.  4. The mitral valve is normal in structure. No evidence of mitral valve regurgitation. No evidence of mitral stenosis.  5. The aortic valve is tricuspid. Aortic valve regurgitation is not visualized. No aortic stenosis is present.  6. Aortic dilatation noted. There is mild dilatation of the ascending aorta, measuring 41 mm.  7. The inferior vena cava is normal in size with greater than 50% respiratory variability, suggesting right atrial pressure of 3 mmHg. FINDINGS  Left Ventricle: Global hypokinesis worse in inferior base. Left ventricular ejection fraction, by estimation, is 45 to 50%. The left ventricle has mildly decreased function. The left ventricle demonstrates global hypokinesis. The left ventricular internal cavity size was mildly dilated. There is moderate left ventricular hypertrophy. Left ventricular  diastolic parameters were normal. Right Ventricle: The right ventricular size is normal. No increase in right ventricular wall thickness. Right ventricular systolic function is normal. Left Atrium: Left atrial size was moderately dilated. Right Atrium: Right atrial size was normal in size. Pericardium: There is no evidence of pericardial effusion. Mitral Valve: The mitral valve is normal in structure. No evidence of mitral valve regurgitation. No evidence of mitral valve stenosis. Tricuspid Valve: The tricuspid valve is normal in structure. Tricuspid valve regurgitation is not demonstrated. No evidence of tricuspid stenosis. Aortic Valve: The aortic valve is tricuspid. Aortic valve regurgitation is not visualized. No aortic stenosis is present. Aortic valve peak gradient measures 5.5 mmHg. Pulmonic Valve: The pulmonic valve was normal in structure. Pulmonic valve regurgitation is not visualized. No evidence of pulmonic stenosis. Aorta: Aortic dilatation noted. There is mild dilatation of the ascending aorta, measuring 41 mm. Venous: The inferior vena cava is normal in size with greater than 50% respiratory variability, suggesting right atrial pressure of 3 mmHg. IAS/Shunts: No atrial level shunt detected by color flow Doppler.  LEFT VENTRICLE PLAX 2D LVIDd:         5.30 cm      Diastology LVIDs:         4.80 cm      LV e' lateral:   9.68 cm/s LV PW:         1.40 cm      LV E/e' lateral: 6.7 LV IVS:        1.50 cm LVOT diam:     2.60 cm LV SV:         139 LV SV Index:   53 LVOT Area:     5.31 cm  LV Volumes (MOD) LV vol d, MOD A4C: 250.5 ml LV vol s, MOD A4C: 179.0 ml LV SV MOD A4C:     250.5 ml RIGHT VENTRICLE             IVC RV S prime:     10.40 cm/s  IVC diam: 2.40 cm LEFT ATRIUM             Index        RIGHT ATRIUM           Index LA diam:        4.70 cm 1.81 cm/m   RA Area:     11.90 cm LA Vol (  A2C):   83.9 ml 32.25 ml/m  RA Volume:   25.80 ml  9.92 ml/m LA Vol (A4C):   92.4 ml 35.52 ml/m LA Biplane Vol:  88.4 ml 33.98 ml/m  AORTIC VALVE                 PULMONIC VALVE AV Area (Vmax): 6.13 cm     PV Vmax:       0.82 m/s AV Vmax:        117.00 cm/s  PV Peak grad:  2.7 mmHg AV Peak Grad:   5.5 mmHg LVOT Vmax:      135.00 cm/s LVOT Vmean:     85.000 cm/s LVOT VTI:       0.262 m  AORTA Ao Root diam: 4.10 cm Ao Asc diam:  4.10 cm MITRAL VALVE MV Area (PHT): 3.27 cm    SHUNTS MV Decel Time: 232 msec    Systemic VTI:  0.26 m MV E velocity: 64.60 cm/s  Systemic Diam: 2.60 cm MV A velocity: 81.10 cm/s MV E/A ratio:  0.80 Charlton Haws MD Electronically signed by Charlton Haws MD Signature Date/Time: 04/14/2021/2:58:28 PM    Final    ECHOCARDIOGRAM LIMITED BUBBLE STUDY  Result Date: 04/16/2021    ECHOCARDIOGRAM LIMITED REPORT   Patient Name:   JHONY ANTRIM Date of Exam: 04/16/2021 Medical Rec #:  [id number]    Height:       72.0 in Accession #:    [id number]   Weight:       315.3 lb Date of Birth:  [birth year]    BSA:          2.585 m Patient Age:    41 years     BP:           156/103 mmHg Patient Gender: M            HR:           77 bpm. Exam Location:  Inpatient Procedure: 2D Echo and Limited Echo Indications:    Hypoxia  History:        Patient has prior history of Echocardiogram examinations, most                 recent 04/14/2020. Risk Factors:Hypertension and Sleep Apnea.  Sonographer:    Eduard Roux Referring Phys: [id number] Wendall Stade  Sonographer Comments: Image acquisition challenging due to patient body habitus. IMPRESSIONS  1. Negative bubble study supine and upright No evidence of platyonea orthodeoxia Mild LVE moderate LVH EF 45-50%. FINDINGS  Additional Comments: Negative bubble study supine and upright No evidence of platyonea orthodeoxia Mild LVE moderate LVH EF 45-50%. Charlton Haws MD Electronically signed by Charlton Haws MD Signature Date/Time: 04/16/2021/10:50:54 AM    Final     Cardiac Studies:  ECG: SR rate 92 LAE no signs RV strain  Telemetry:  NSR 04/16/2021   Echo: EF 45-50% moderate LVH RV normal no  signs pulmonary HTN Bubble study pending   Medications:    enoxaparin (LOVENOX) injection  60 mg Subcutaneous Q24H   ferrous sulfate  325 mg Oral Q breakfast   folic acid  1 mg Oral Daily   furosemide  20 mg Oral Daily   losartan  50 mg Oral Daily   mometasone-formoterol  2 puff Inhalation BID   multivitamin with minerals  1 tablet Oral Daily   potassium chloride  40 mEq Oral Once   thiamine  100 mg Oral Daily   Or   thiamine  100 mg Intravenous Daily   umeclidinium bromide  1 puff Inhalation Daily   vitamin B-12  1,000 mcg Oral Daily      Assessment/Plan:  Dyspnea:  likely multifactorial. OSA obesity hypoventilation CTA no PE. 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 Echo with no signs of pulmonary HTN. 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 No defined RWMA mild decrease in function EF 45-50% with moderate LVH and ECG with LAE. BNP normal and CXR no CHF can decrease dose of lasix. Increased ARB Losartan 50 mg daily See above left heart cath to assess EDP and r/o CAD Risks of cath including stroke, bleeding , intubation surgery and contrast reaction discussed willing to proceed Will try to place on schedule Monday afternoon Orders written HTN see above add bidil to lasix and ARB supplement K  Charlton Haws 04/16/2021, 10:13 AM

## 2021-04-16 NOTE — Progress Notes (Signed)
Progress Note   Patient: Jesse Morris WPY:[id number] DOB: Sep 21, 1980 DOA: 04/13/2021     3 DOS: the patient was seen and examined on 04/16/2021   Brief hospital course: Mr. Kautzman is a 41 y.o. male with PMH obesity and OSA who was recently admitted to [facility] in Jan with SOB due to Influenza A.  Not needing O2 at time of discharge. Since that admission he has had persistent SOB which worsened in last couple of days with severe dyspnea especially on exertion and has not felt better.  He continues to have severe daytime sleepiness he did have a positive sleep study and was diagnosed with sleep apnea by pulmonology approximately 1 year ago but never received CPAP machine. He was seen in the ED, 87% on room air, underwent further work-up with CTA no acute finding, BNP normal.  He underwent echo for further work-up on admission which showed EF 45 to 50% with global hypokinesis and moderate LVH.  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  Apparently has had difficulty obtaining home CPAP -He does endorse that he sleeps better when using CPAP in the hospital - Will follow-up with case management prior to discharge for CPAP status  Elevated troponin Troponin borderline elevated.  Could be demand ischemia in the setting of hypoxia. - follow up heart cath results planned for Monday   HTN (hypertension)- (present on admission) - continue losartan; adjusted per cardiology  -BiDil added on 04/16/2021 due to still elevated BPs  Normocytic anemia -  baseline appears to be approximately 11-12 g/dL - currently at baseline; no overt renal dysfunction - trend for now  Hypokalemia - Replete as needed  Mixed hyperlipidemia- (present on admission) -LDL 144 - ASCVD max 4-5%. Will defer need for statin to cardiology - follow up cath results   Obesity, Class III, BMI 40-49.9 (morbid obesity) (HCC) - Complicates overall prognosis and care - Body mass index is 43.44 kg/m. - Weight Loss and Dietary Counseling given      Subjective: No events overnight. Slept well with CPAP. Asking if he can shower. Ready for cath tomorrow.   Physical Exam: Vitals:   04/16/21 0418 04/16/21 0834 04/16/21 0835 04/16/21 1226  BP: (!) 156/103   110/83  Pulse: 89   98  Resp:    20  Temp: 98.7 F (37.1 C)   98.7 F (37.1 C)  TempSrc: Oral   Oral  SpO2:  96% 95% 98%  Weight:      Height:      Physical Exam Constitutional:      General: He is not in acute distress.    Appearance: Normal appearance. He is obese.  HENT:     Head: Normocephalic and atraumatic.     Mouth/Throat:     Mouth: Mucous membranes are moist.  Eyes:     Extraocular Movements: Extraocular movements intact.  Cardiovascular:     Rate and Rhythm: Normal rate and regular rhythm.     Heart sounds: Normal heart sounds.  Pulmonary:     Effort: Pulmonary effort is normal. No respiratory distress.  Breath sounds: Normal breath sounds. No wheezing.  Abdominal:     General: Bowel sounds are normal. There is no distension.     Palpations: Abdomen is soft.     Tenderness: There is no abdominal tenderness.  Musculoskeletal:        General: Normal range of motion.     Cervical back: Normal range of motion and neck supple.  Skin:    General: Skin is warm and dry.  Neurological:     General: No focal deficit present.     Mental Status: He is alert.  Psychiatric:        Mood and Affect: Mood normal.        Behavior: Behavior normal.   Data Reviewed:  I have Reviewed nursing  notes, Vitals, and Lab results since pt's last encounter. Pertinent lab results K 3.4, Hgb 12 I have ordered test including BMP, CBC, Mg I have reviewed the last note from all staff from past 24 hours,  I have discussed pt's care plan and test results with nursing staff, CM.   Family Communication: mother  Disposition: Status is: Inpatient Remains inpatient appropriate because: abnormal echo workup, hypoxia     Planned Discharge Destination: Home     Author: Lewie Chamber, MD 04/16/2021 1:11 PM  For on call review [email or web address].

## 2021-04-17 ENCOUNTER — Encounter (HOSPITAL_COMMUNITY)
Admission: EM | Disposition: A | Discharge status: Home / Self Care | Payer: Self-pay | Source: Home / Self Care | Attending: Internal Medicine

## 2021-04-17 ENCOUNTER — Encounter (HOSPITAL_COMMUNITY): Payer: Self-pay | Admitting: Internal Medicine

## 2021-04-17 DIAGNOSIS — I509 Heart failure, unspecified: Secondary | ICD-10-CM

## 2021-04-17 DIAGNOSIS — I272 Pulmonary hypertension, unspecified: Secondary | ICD-10-CM

## 2021-04-17 HISTORY — PX: RIGHT/LEFT HEART CATH AND CORONARY ANGIOGRAPHY: CATH118266

## 2021-04-17 LAB — POCT I-STAT EG7
Acid-Base Excess: 3 mmol/L — ABNORMAL HIGH (ref 0.0–2.0)
Acid-Base Excess: 4 mmol/L — ABNORMAL HIGH (ref 0.0–2.0)
Bicarbonate: 29.3 mmol/L — ABNORMAL HIGH (ref 20.0–28.0)
Bicarbonate: 29.4 mmol/L — ABNORMAL HIGH (ref 20.0–28.0)
Calcium, Ion: 1.21 mmol/L (ref 1.15–1.40)
Calcium, Ion: 1.21 mmol/L (ref 1.15–1.40)
HCT: 35 % — ABNORMAL LOW (ref 39.0–52.0)
HCT: 35 % — ABNORMAL LOW (ref 39.0–52.0)
Hemoglobin: 11.9 g/dL — ABNORMAL LOW (ref 13.0–17.0)
Hemoglobin: 11.9 g/dL — ABNORMAL LOW (ref 13.0–17.0)
O2 Saturation: 63 %
O2 Saturation: 70 %
Potassium: 3.6 mmol/L (ref 3.5–5.1)
Potassium: 3.6 mmol/L (ref 3.5–5.1)
Sodium: 140 mmol/L (ref 135–145)
Sodium: 139 mmol/L (ref 135–145)
TCO2: 31 mmol/L (ref 22–32)
TCO2: 31 mmol/L (ref 22–32)
pCO2, Ven: 50.4 mmHg (ref 44.0–60.0)
pCO2, Ven: 48.7 mmHg (ref 44.0–60.0)
pH, Ven: 7.372 (ref 7.250–7.430)
pH, Ven: 7.389 (ref 7.250–7.430)
pO2, Ven: 34 mmHg (ref 32.0–45.0)
pO2, Ven: 37 mmHg (ref 32.0–45.0)

## 2021-04-17 LAB — BASIC METABOLIC PANEL
Anion gap: 8 (ref 5–15)
BUN: 15 mg/dL (ref 6–20)
CO2: 31 mmol/L (ref 22–32)
Calcium: 8.6 mg/dL — ABNORMAL LOW (ref 8.9–10.3)
Chloride: 99 mmol/L (ref 98–111)
Creatinine, Ser: 1.05 mg/dL (ref 0.61–1.24)
GFR, Estimated: >60 mL/min (ref >60)
Glucose, Bld: 92 mg/dL (ref 70–99)
Potassium: 3.3 mmol/L — ABNORMAL LOW (ref 3.5–5.1)
Sodium: 138 mmol/L (ref 135–145)

## 2021-04-17 LAB — CBC WITH DIFFERENTIAL/PLATELET
Abs Immature Granulocytes: 0.02 10*3/uL (ref 0.00–0.07)
Basophils Absolute: 0.1 10*3/uL (ref 0.0–0.1)
Basophils Relative: 1 %
Eosinophils Absolute: 0.3 10*3/uL (ref 0.0–0.5)
Eosinophils Relative: 4 %
HCT: 35.2 % — ABNORMAL LOW (ref 39.0–52.0)
Hemoglobin: 10.8 g/dL — ABNORMAL LOW (ref 13.0–17.0)
Immature Granulocytes: 0 %
Lymphocytes Relative: 27 %
Lymphs Abs: 1.9 10*3/uL (ref 0.7–4.0)
MCH: 28.6 pg (ref 26.0–34.0)
MCHC: 30.7 g/dL (ref 30.0–36.0)
MCV: 93.1 fL (ref 80.0–100.0)
Monocytes Absolute: 0.6 10*3/uL (ref 0.1–1.0)
Monocytes Relative: 8 %
Neutro Abs: 4.2 10*3/uL (ref 1.7–7.7)
Neutrophils Relative %: 60 %
Platelets: 236 10*3/uL (ref 150–400)
RBC: 3.78 MIL/uL — ABNORMAL LOW (ref 4.22–5.81)
RDW: 15.3 % (ref 11.5–15.5)
WBC: 7 10*3/uL (ref 4.0–10.5)
nRBC: 0 % (ref 0.0–0.2)

## 2021-04-17 LAB — HEMOGLOBIN A1C
Hgb A1c MFr Bld: 5.4 % (ref 4.8–5.6)
Mean Plasma Glucose: 108.28 mg/dL

## 2021-04-17 LAB — MAGNESIUM: Magnesium: 1.9 mg/dL (ref 1.7–2.4)

## 2021-04-17 SURGERY — RIGHT/LEFT HEART CATH AND CORONARY ANGIOGRAPHY
Anesthesia: LOCAL

## 2021-04-17 MED ORDER — MIDAZOLAM HCL 2 MG/2ML IJ SOLN
INTRAMUSCULAR | Status: DC | PRN
  Administered 2021-04-17 (×2): 1 mg via INTRAVENOUS

## 2021-04-17 MED ORDER — LIDOCAINE HCL (PF) 1 % IJ SOLN
INTRAMUSCULAR | Status: AC
  Filled 2021-04-17: qty 30

## 2021-04-17 MED ORDER — HEPARIN SODIUM (PORCINE) 1000 UNIT/ML IJ SOLN
INTRAMUSCULAR | Status: DC | PRN
  Administered 2021-04-17: 6000 [IU] via INTRAVENOUS

## 2021-04-17 MED ORDER — HEPARIN (PORCINE) IN NACL 1000-0.9 UT/500ML-% IV SOLN
INTRAVENOUS | Status: AC
  Filled 2021-04-17: qty 1000

## 2021-04-17 MED ORDER — VERAPAMIL HCL 2.5 MG/ML IV SOLN
INTRAVENOUS | Status: AC
  Filled 2021-04-17: qty 2

## 2021-04-17 MED ORDER — LIDOCAINE HCL (PF) 1 % IJ SOLN
INTRAMUSCULAR | Status: DC | PRN
  Administered 2021-04-17 (×2): 2 mL

## 2021-04-17 MED ORDER — MIDAZOLAM HCL 2 MG/2ML IJ SOLN
INTRAMUSCULAR | Status: AC
  Filled 2021-04-17: qty 2

## 2021-04-17 MED ORDER — HEPARIN SODIUM (PORCINE) 1000 UNIT/ML IJ SOLN
INTRAMUSCULAR | Status: AC
  Filled 2021-04-17: qty 10

## 2021-04-17 MED ORDER — FENTANYL CITRATE (PF) 100 MCG/2ML IJ SOLN
INTRAMUSCULAR | Status: AC
  Filled 2021-04-17: qty 2

## 2021-04-17 MED ORDER — IOHEXOL 350 MG/ML SOLN
INTRAVENOUS | Status: DC | PRN
  Administered 2021-04-17: 135 mL

## 2021-04-17 MED ORDER — HEPARIN (PORCINE) IN NACL 1000-0.9 UT/500ML-% IV SOLN
INTRAVENOUS | Status: DC | PRN
  Administered 2021-04-17 (×2): 500 mL

## 2021-04-17 MED ORDER — VERAPAMIL HCL 2.5 MG/ML IV SOLN
INTRAVENOUS | Status: DC | PRN
  Administered 2021-04-17: 10 mL via INTRA_ARTERIAL

## 2021-04-17 MED ORDER — POTASSIUM CHLORIDE CRYS ER 20 MEQ PO TBCR: 40.0000 meq | EXTENDED_RELEASE_TABLET | Freq: Once | ORAL | Status: DC

## 2021-04-17 MED ORDER — FENTANYL CITRATE (PF) 100 MCG/2ML IJ SOLN
INTRAMUSCULAR | Status: DC | PRN
  Administered 2021-04-17 (×2): 25 ug via INTRAVENOUS

## 2021-04-17 SURGICAL SUPPLY — 17 items
CATH 5FR JL3.5 JR4 ANG PIG MP (CATHETERS) ×1 IMPLANT
CATH INFINITI 5 FR 3DRC (CATHETERS) ×1 IMPLANT
CATH INFINITI 5FR AL1 (CATHETERS) ×1 IMPLANT
CATH INFINITI 5FR JL4 (CATHETERS) ×1 IMPLANT
CATH LAUNCHER 5F RADR (CATHETERS) IMPLANT
CATH SWAN GANZ 7F STRAIGHT (CATHETERS) ×1 IMPLANT
CATHETER LAUNCHER 5F RADR (CATHETERS) ×2
DEVICE RAD COMP TR BAND LRG (VASCULAR PRODUCTS) ×1 IMPLANT
GLIDESHEATH SLEND SS 6F .021 (SHEATH) ×1 IMPLANT
GLIDESHEATH SLENDER 7FR .021G (SHEATH) ×1 IMPLANT
GUIDEWIRE INQWIRE 1.5J.035X260 (WIRE) IMPLANT
INQWIRE 1.5J .035X260CM (WIRE) ×2
KIT HEART LEFT (KITS) ×2 IMPLANT
MAT PREVALON FULL STRYKER (MISCELLANEOUS) ×1 IMPLANT
PACK CARDIAC CATHETERIZATION (CUSTOM PROCEDURE TRAY) ×2 IMPLANT
TRANSDUCER W/STOPCOCK (MISCELLANEOUS) ×2 IMPLANT
WIRE MICROINTRODUCER 60CM (WIRE) ×1 IMPLANT

## 2021-04-17 NOTE — Interval H&P Note (Signed)
History and Physical Interval Note:  04/17/2021 10:31 AM  Jesse Morris  has presented today for surgery, with the diagnosis of unstable angina.  The various methods of treatment have been discussed with the patient and family. After consideration of risks, benefits and other options for treatment, the patient has consented to  Procedure(s): RIGHT/LEFT HEART CATH AND CORONARY ANGIOGRAPHY (N/A) as a surgical intervention.  The patient's history has been reviewed, patient examined, no change in status, stable for surgery.  I have reviewed the patient's chart and labs.  Questions were answered to the patient's satisfaction.     Adream Parzych [employer]

## 2021-04-17 NOTE — Progress Notes (Signed)
Progress Note   Patient: Jesse Morris CLZ:[id number] DOB: [birth year] DOA: 04/13/2021     4 DOS: the patient was seen and examined on 04/17/2021   Brief hospital course: Mr. Robideau is a 41 y.o. male with PMH obesity and OSA who was recently admitted to [facility] in Jan with SOB due to Influenza A.  Not needing O2 at time of discharge. Since that admission he has had persistent SOB which worsened in last couple of days with severe dyspnea especially on exertion and has not felt better.  He continues to have severe daytime sleepiness he did have a positive sleep study and was diagnosed with sleep apnea by pulmonology approximately 1 year ago but never received CPAP machine. He was seen in the ED, 87% on room air, underwent further work-up with CTA no acute finding, BNP normal.  He underwent echo for further work-up on admission which showed EF 45 to 50% with global hypokinesis and moderate LVH. He was evaluated by cardiology and underwent right/left heart cath on 04/17/2021.  He had very minimal CAD (15% stenosis in mid LAD to distal LAD).  Mildly elevated LVEDP, normal PCWP, high cardiac output, borderline pulmonary hypertension.  He was recommended to continue on treatment for his underlying sleep apnea.  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  Apparently has had difficulty obtaining home CPAP -He does endorse that he sleeps better when using CPAP in the hospital -Sleep study was a little over a year ago he states which was arranged outpatient  by his PCP previously; he does endorse that he would be compliant if able to obtain the machine -Discussed with RT today.  They will use formal settings tonight on CPAP and we will send to [employer] tomorrow for hopeful approval of CPAP machine in efforts to get this on discharge; backup plan would be repeating outpatient sleep study and reordering CPAP machine at that time  Pulmonary hypertension (HCC) - s/p right/left heart cath on 04/17/21 - Mildly elevated LVEDP, normal PCWP, high cardiac output, borderline pulmonary hypertension - trying to arrange outpt CPAP machine  Elevated troponin Troponin borderline elevated.  Could be demand ischemia in the setting of hypoxia.  HTN (hypertension)- (present on admission) - continue losartan; adjusted per cardiology  -BiDil added on 04/16/2021 due to still elevated BPs  Normocytic anemia - baseline appears to be approximately 11-12 g/dL - currently at baseline - trend for now - B12 low, supplementation started   Hypokalemia - Replete as needed  Mixed hyperlipidemia- (present on admission) -LDL 144 - ASCVD max 4-5%. Will defer need for statin to cardiology. Minimal nonobs CAD on cath  Obesity, Class III, BMI 40-49.9 (morbid obesity) (HCC) - Complicates overall prognosis and care - Body mass index is 43.44 kg/m. - Weight Loss and Dietary Counseling given      Subjective: Underwent cath today. Met with patient in his room after he returned. Mom and other family members present. I updated them and reviewed findings overall.  We're still working on getting CPAP approval etc from [employer].  He feels okay otherwise.  Physical Exam: Vitals:   04/17/21 1230 04/17/21 1235 04/17/21 1240 04/17/21 1255  BP:   127/65 120/74  Pulse: 86 83 73 83  Resp: (!) 22 (!) 24 (!) 21 20  Temp:      TempSrc:      SpO2: 95% (!) 85% (!) 84% 98%  Weight:      Height:      Physical Exam Constitutional:      General: He is not in acute distress.    Appearance:  Normal appearance. He is obese.  HENT:     Head: Normocephalic and atraumatic.     Mouth/Throat:     Mouth: Mucous membranes are moist.  Eyes:     Extraocular Movements: Extraocular movements intact.  Cardiovascular:     Rate and Rhythm: Normal rate and regular rhythm.     Heart sounds: Normal heart sounds.  Pulmonary:     Effort: Pulmonary effort is normal. No respiratory distress.     Breath sounds: Normal breath sounds. No wheezing.  Abdominal:     General: Bowel sounds are normal. There is no distension.     Palpations: Abdomen is soft.     Tenderness: There is no abdominal tenderness.  Musculoskeletal:        General: Normal range of motion.     Cervical back: Normal range of motion and neck supple.  Skin:    General: Skin is warm and dry.  Neurological:     General: No focal deficit present.     Mental Status: He is alert.  Psychiatric:        Mood and Affect: Mood normal.        Behavior: Behavior normal.   Data Reviewed:  I have Reviewed nursing notes, Vitals, and Lab results since pt's last encounter. Pertinent lab results K 3.6, Hgb 11.9 I have ordered test including BMP, CBC, Mg I have reviewed the last note from all staff from past 24 hours,  I have discussed pt's care plan and test results with nursing staff, CM.   Family Communication: mother  Disposition: Status is: Inpatient Remains inpatient appropriate because: abnormal echo workup, hypoxia     Planned Discharge Destination: Home hopefully 2/7     Author: Dwyane Dee, MD 04/17/2021 3:49 PM  For on call review [email or web address].

## 2021-04-17 NOTE — TOC Initial Note (Signed)
Transition of Care [facility]) - Initial/Assessment Note    Patient Details  Name: Jesse Morris MRN: [id number] Date of Birth: 06-Jul-1980  Transition of [facility]) CM/SW Contact:    Golda Acre, RN Phone Number: 04/17/2021, 10:18 AM  Clinical Narrative:                 Cpap for home use ordered through [employer]  Expected Discharge Plan: Home/Self Care Barriers to Discharge: Continued Medical Work up   Patient Goals and CMS Choice Patient states their goals for this hospitalization and ongoing recovery are:: to go home      Expected Discharge Plan and Services Expected Discharge Plan: Home/Self Care   Discharge Planning Services: CM Consult Post Acute Care Choice: Durable Medical Equipment Living arrangements for the past 2 months: Apartment                 DME Arranged: Continuous positive airway pressure (CPAP) DME Agency: [employer] Date DME Agency Contacted: 04/17/21 Time DME Agency Contacted: [id number] Representative spoke with at DME Agency: danielle            Prior Living Arrangements/Services Living arrangements for the past 2 months: Apartment Lives with:: Self Patient language and need for interpreter reviewed:: Yes Do you feel safe going back to the place where you live?: Yes            Criminal Activity/Legal Involvement Pertinent to Current Situation/Hospitalization: No - Comment as needed  Activities of Daily Living Home Assistive Devices/Equipment: None ADL Screening (condition at time of admission) Patient's cognitive ability adequate to safely complete daily activities?: Yes Is the patient deaf or have difficulty hearing?: No Does the patient have difficulty seeing, even when wearing glasses/contacts?: No Does the patient have difficulty concentrating, remembering, or making decisions?: No Patient able to express need for assistance with ADLs?: Yes (SOB) Does the patient have difficulty dressing or bathing?: Yes Independently performs ADLs?: Yes  (appropriate for developmental age) Does the patient have difficulty walking or climbing stairs?: Yes (SOB) Weakness of Legs: None Weakness of Arms/Hands: None  Permission Sought/Granted                  Emotional Assessment Appearance:: Appears stated age     Orientation: : Oriented to Self, Oriented to Place, Oriented to  Time, Oriented to Situation Alcohol / Substance Use: Not Applicable Psych Involvement: No (comment)  Admission diagnosis:  Hypoxia [R09.02] Acute respiratory failure with hypoxia (HCC) [J96.01] Sleep apnea, unspecified type [G47.30] Patient Active Problem List   Diagnosis Date Noted   Hypoxia    Normocytic anemia 04/15/2021   Obesity, Class III, BMI 40-49.9 (morbid obesity) (HCC) 04/14/2021   Mixed hyperlipidemia 04/14/2021   Hypokalemia 04/14/2021   Elevated troponin 04/14/2021   Acute respiratory failure with hypoxia (HCC) 04/13/2021   OSA (obstructive sleep apnea) 04/13/2021   HTN (hypertension) 04/13/2021   Influenza A 03/16/2021   PCP:  Norm Salt, PA Pharmacy:   [id number] - [place], [place] - [address] [place] [address] Phone: [phone] Fax: [phone]     Social Determinants of Health (SDOH) Interventions    Readmission Risk Interventions No flowsheet data found.

## 2021-04-17 NOTE — Progress Notes (Addendum)
Pt received into [facility], Cath Lab holding, connected to monitor, 2L [place] in place, IVFs (NS) continue infusing from [facility], see flowsheets for VS and PIV's, safety maintained, call bell given, report received from [facility], discussed procedure with patient

## 2021-04-17 NOTE — Assessment & Plan Note (Signed)
-   s/p right/left heart cath on 04/17/21 - Mildly elevated LVEDP, normal PCWP, high cardiac output, borderline pulmonary hypertension - trying to arrange outpt CPAP machine

## 2021-04-17 NOTE — Progress Notes (Signed)
Right radial TR band removed. Folded 2x2 gauze and tegederm applied.  No hematoma noted, +3 radial pulse.  Pt instructed about Right arm use and precautions after arterial stick.  Verbalizes understanding.  Awaiting Care Link for Tx back to [facility].

## 2021-04-18 ENCOUNTER — Other Ambulatory Visit (HOSPITAL_COMMUNITY): Payer: Self-pay

## 2021-04-18 ENCOUNTER — Encounter (HOSPITAL_COMMUNITY): Payer: Self-pay | Admitting: Cardiology

## 2021-04-18 DIAGNOSIS — I272 Pulmonary hypertension, unspecified: Secondary | ICD-10-CM

## 2021-04-18 DIAGNOSIS — I5021 Acute systolic (congestive) heart failure: Secondary | ICD-10-CM

## 2021-04-18 LAB — BASIC METABOLIC PANEL
Anion gap: 4 — ABNORMAL LOW (ref 5–15)
BUN: 12 mg/dL (ref 6–20)
CO2: 29 mmol/L (ref 22–32)
Calcium: 8.5 mg/dL — ABNORMAL LOW (ref 8.9–10.3)
Chloride: 105 mmol/L (ref 98–111)
Creatinine, Ser: 0.94 mg/dL (ref 0.61–1.24)
GFR, Estimated: >60 mL/min (ref >60)
Glucose, Bld: 113 mg/dL — ABNORMAL HIGH (ref 70–99)
Potassium: 3.5 mmol/L (ref 3.5–5.1)
Sodium: 138 mmol/L (ref 135–145)

## 2021-04-18 LAB — CBC WITH DIFFERENTIAL/PLATELET
Abs Immature Granulocytes: 0.03 10*3/uL (ref 0.00–0.07)
Basophils Absolute: 0 10*3/uL (ref 0.0–0.1)
Basophils Relative: 1 %
Eosinophils Absolute: 0.3 10*3/uL (ref 0.0–0.5)
Eosinophils Relative: 4 %
HCT: 34.7 % — ABNORMAL LOW (ref 39.0–52.0)
Hemoglobin: 10.8 g/dL — ABNORMAL LOW (ref 13.0–17.0)
Immature Granulocytes: 0 %
Lymphocytes Relative: 30 %
Lymphs Abs: 2 10*3/uL (ref 0.7–4.0)
MCH: 29 pg (ref 26.0–34.0)
MCHC: 31.1 g/dL (ref 30.0–36.0)
MCV: 93 fL (ref 80.0–100.0)
Monocytes Absolute: 0.6 10*3/uL (ref 0.1–1.0)
Monocytes Relative: 9 %
Neutro Abs: 3.8 10*3/uL (ref 1.7–7.7)
Neutrophils Relative %: 56 %
Platelets: 227 10*3/uL (ref 150–400)
RBC: 3.73 MIL/uL — ABNORMAL LOW (ref 4.22–5.81)
RDW: 15.4 % (ref 11.5–15.5)
WBC: 6.7 10*3/uL (ref 4.0–10.5)
nRBC: 0 % (ref 0.0–0.2)

## 2021-04-18 LAB — CULTURE, BLOOD (ROUTINE X 2)
Culture: NO GROWTH
Culture: NO GROWTH
Special Requests: ADEQUATE

## 2021-04-18 LAB — MAGNESIUM: Magnesium: 2.1 mg/dL (ref 1.7–2.4)

## 2021-04-18 MED ORDER — TRAMADOL HCL 50 MG PO TABS
50.0000 mg | ORAL_TABLET | Freq: Four times a day (QID) | ORAL | Status: DC | PRN
Start: 2021-04-18 — End: 2021-04-19
  Administered 2021-04-18: 50 mg via ORAL
  Filled 2021-04-18: qty 1

## 2021-04-18 MED ORDER — ACETAMINOPHEN 325 MG PO TABS: 650.0000 mg | ORAL_TABLET | ORAL | Status: DC | PRN

## 2021-04-18 MED ORDER — METOCLOPRAMIDE HCL 5 MG/ML IJ SOLN
10.0000 mg | Freq: Four times a day (QID) | INTRAMUSCULAR | Status: DC | PRN
  Administered 2021-04-18: 10 mg via INTRAVENOUS
  Filled 2021-04-18: qty 2

## 2021-04-18 MED ORDER — LOSARTAN POTASSIUM 50 MG PO TABS: 100.0000 mg | ORAL_TABLET | Freq: Every day | ORAL | Status: DC

## 2021-04-18 MED ORDER — DIPHENHYDRAMINE HCL 50 MG/ML IJ SOLN
25.0000 mg | Freq: Four times a day (QID) | INTRAMUSCULAR | Status: DC | PRN
  Administered 2021-04-18: 25 mg via INTRAVENOUS
  Filled 2021-04-18: qty 1

## 2021-04-18 MED ORDER — SACUBITRIL-VALSARTAN 24-26 MG PO TABS
1.0000 | ORAL_TABLET | Freq: Two times a day (BID) | ORAL | Status: DC
  Filled 2021-04-18: qty 1

## 2021-04-18 MED ORDER — LOSARTAN POTASSIUM 50 MG PO TABS
50.0000 mg | ORAL_TABLET | Freq: Once | ORAL | Status: AC
  Administered 2021-04-18: 50 mg via ORAL
  Filled 2021-04-18: qty 1

## 2021-04-18 MED ORDER — HYDRALAZINE HCL 50 MG PO TABS: 50.0000 mg | ORAL_TABLET | Freq: Three times a day (TID) | ORAL | Status: DC

## 2021-04-18 MED ORDER — SPIRONOLACTONE 25 MG PO TABS
25.0000 mg | ORAL_TABLET | Freq: Every day | ORAL | Status: DC
  Administered 2021-04-18 – 2021-04-19 (×2): 25 mg via ORAL
  Filled 2021-04-18 (×2): qty 1

## 2021-04-18 MED ORDER — KETOROLAC TROMETHAMINE 15 MG/ML IJ SOLN
15.0000 mg | Freq: Four times a day (QID) | INTRAMUSCULAR | Status: DC | PRN
  Administered 2021-04-18: 15 mg via INTRAVENOUS
  Filled 2021-04-18: qty 1

## 2021-04-18 NOTE — Assessment & Plan Note (Signed)
-   low normal EF 45-50%; mild pulm HTN on R/L heart cath - cardiology following - possibly did not tolerate bidil started on 2/6 as he had severe HA today (will treat HA, but cardiology also discontinuing med now too) - has been on losartan but appears plan is for Entresto (need to follow up with patient if this is affordable as per pharmacy eligibility note) - Wilder Glade and Vania Rea also need prior auth

## 2021-04-18 NOTE — TOC Benefit Eligibility Note (Signed)
Patient Product/process development scientist completed.    The patient is currently admitted and upon discharge could be taking Entresto 24-26 mg.  The current 30 day co-pay is, $228.79 due to a $50.00 deductible.   The patient is currently admitted and upon discharge could be taking Farxiga 10 mg.  Requires Prior Authorization  The patient is currently admitted and upon discharge could be taking Jardiance 10 mg.  Requires Prior Authorization  The patient is insured through [employer] [facility] only one that takes this insurance)     Jesse Morris, CPhT Pharmacy Patient Advocate Specialist [facility] Patient Advocate Team Direct Number: [phone]  Fax: [phone]

## 2021-04-18 NOTE — Progress Notes (Incomplete)
Progress Note  Patient Name: Jesse Morris Date of Encounter: 04/18/2021  [facility] Cardiologist: None   Subjective   ***  Inpatient Medications    Scheduled Meds:  enoxaparin (LOVENOX) injection  60 mg Subcutaneous Q24H   ferrous sulfate  325 mg Oral Q breakfast   folic acid  1 mg Oral Daily   furosemide  20 mg Oral Daily   mometasone-formoterol  2 puff Inhalation BID   multivitamin with minerals  1 tablet Oral Daily   potassium chloride  40 mEq Oral Once   [START ON 04/19/2021] sacubitril-valsartan  1 tablet Oral BID   spironolactone  25 mg Oral Daily   thiamine  100 mg Oral Daily   Or   thiamine  100 mg Intravenous Daily   umeclidinium bromide  1 puff Inhalation Daily   vitamin B-12  1,000 mcg Oral Daily   Continuous Infusions:  PRN Meds: acetaminophen **OR** [DISCONTINUED] acetaminophen, albuterol, diphenhydrAMINE **AND** metoCLOPramide (REGLAN) injection **AND** ketorolac, labetalol, ondansetron **OR** ondansetron (ZOFRAN) IV, traMADol   Vital Signs    Vitals:   04/18/21 0601 04/18/21 0806 04/18/21 1332 04/18/21 1932  BP: (!) 141/97  110/71   Pulse: 73  90   Resp: 16  16   Temp: 98 F (36.7 C)  98.7 F (37.1 C)   TempSrc: Oral  Oral   SpO2: 93% 95% 100% 92%  Weight:      Height:        Intake/Output Summary (Last 24 hours) at 04/18/2021 1950 Last data filed at 04/18/2021 1000 Gross per 24 hour  Intake 360 ml  Output --  Net 360 ml    Last 3 Weights 04/18/2021 04/17/2021 04/15/2021  Weight (lbs) 325 lb 2.9 oz 321 lb 6.9 oz 315 lb 4.8 oz  Weight (kg) 147.5 kg 145.8 kg 143.019 kg      Telemetry    NSR - Personally Reviewed  ECG    No new tracing - Personally Reviewed  Physical Exam   GEN: Laying in bed, comfortable Neck: No JVD Cardiac: RRR, no murmurs, rubs, or gallops.  Respiratory: Clear to auscultation bilaterally. GI: Obese, soft, ND, NTTP MS: No edema; No deformity. Neuro:  Nonfocal  Psych: Normal affect   Labs    High Sensitivity  Troponin:   Recent Labs  Lab 04/13/21 2234 04/14/21 0155  TROPONINIHS 66* 79*      Chemistry Recent Labs  Lab 04/13/21 1857 04/13/21 1920 04/16/21 0417 04/17/21 0342 04/17/21 1044 04/17/21 1045 04/18/21 0418  NA 139   < > 138 138 140 139 138  K 3.5   < > 3.4* 3.3* 3.6 3.6 3.5  CL 102   < > 98 99  --   --  105  CO2 29   < > 32 31  --   --  29  GLUCOSE 118*   < > 99 92  --   --  113*  BUN 14   < > 14 15  --   --  12  CREATININE 0.98   < > 1.04 1.05  --   --  0.94  CALCIUM 8.8*   < > 8.9 8.6*  --   --  8.5*  MG  --    < > 2.1 1.9  --   --  2.1  PROT 7.3  --   --   --   --   --   --   ALBUMIN 3.8  --   --   --   --   --   --  AST 29  --   --   --   --   --   --   ALT 26  --   --   --   --   --   --   ALKPHOS 43  --   --   --   --   --   --   BILITOT 0.1*  --   --   --   --   --   --   GFRNONAA >60   < > >60 >60  --   --  >60  ANIONGAP 8   < > 8 8  --   --  4*   < > = values in this interval not displayed.     Lipids  Recent Labs  Lab 04/15/21 0947  CHOL 228*  TRIG 121  HDL 60  LDLCALC 144*  CHOLHDL 3.8     Hematology Recent Labs  Lab 04/16/21 0417 04/17/21 0342 04/17/21 1044 04/17/21 1045 04/18/21 0418  WBC 5.5 7.0  --   --  6.7  RBC 4.18* 3.78*  --   --  3.73*  HGB 12.0* 10.8* 11.9* 11.9* 10.8*  HCT 38.4* 35.2* 35.0* 35.0* 34.7*  MCV 91.9 93.1  --   --  93.0  MCH 28.7 28.6  --   --  29.0  MCHC 31.3 30.7  --   --  31.1  RDW 15.5 15.3  --   --  15.4  PLT 267 236  --   --  227    Thyroid No results for input(s): TSH, FREET4 in the last 168 hours.  BNP Recent Labs  Lab 04/13/21 1858  BNP 31.6     DDimer  Recent Labs  Lab 04/13/21 1857  DDIMER 0.48      Radiology    CARDIAC CATHETERIZATION  Result Date: 04/17/2021   Mid LAD to Dist LAD lesion is 15% stenosed. 1. Normal PCWP and RA pressure, mildly elevated LVEDP. 2. High cardiac output. 3. Borderline pulmonary hypertension. 4. Minimal CAD Nonischemic cardiomyopathy.  Needs workup for  sleep apnea.    Cardiac Studies   RHC/LHC 04/18/21:   Mid LAD to Dist LAD lesion is 15% stenosed.   1. Normal PCWP and RA pressure, mildly elevated LVEDP.  2. High cardiac output.  3. Borderline pulmonary hypertension.  4. Minimal CAD   Nonischemic cardiomyopathy.  Needs workup for sleep apnea.   TTE 04/14/21: IMPRESSIONS     1. Global hypokinesis worse in inferior base . Left ventricular ejection  fraction, by estimation, is 45 to 50%. The left ventricle has mildly  decreased function. The left ventricle demonstrates global hypokinesis.  The left ventricular internal cavity  size was mildly dilated. There is moderate left ventricular hypertrophy.  Left ventricular diastolic parameters were normal.   2. Right ventricular systolic function is normal. The right ventricular  size is normal.   3. Left atrial size was moderately dilated.   4. The mitral valve is normal in structure. No evidence of mitral valve  regurgitation. No evidence of mitral stenosis.   5. The aortic valve is tricuspid. Aortic valve regurgitation is not  visualized. No aortic stenosis is present.   6. Aortic dilatation noted. There is mild dilatation of the ascending  aorta, measuring 41 mm.   7. The inferior vena cava is normal in size with greater than 50%  respiratory variability, suggesting right atrial pressure of 3 mmHg.      Patient Profile  41 y.o. male obesity and OSA who presented with worsening SOB and DOE found to have EF 45-50% on TTE with global hypokinesis for which Cardiology was consulted. RHC/LHC on 04/17/21 without evidence of obstructive CAD. Filling pressures normal.  Assessment & Plan    #Acute Systolic Heart Failure: #Nonischemic CM: Patient presented with worsening dyspnea found to have EF 45-50% with global hypokinesis. LHC 2/6 without obstructive disease. RHC with normal PAP (m4mmhg), normal PCWP , high output state with CO 9.08. Will need medical management, aggressive  blood pressure control, and management of underlying OSA. -Continue lasix 20mg  daily -Transition to entresto 24/26mg  BID tomorrow -Start spironolactone 25mg  daily -Will add low dose BB tomorrow AM pending blood pressures -Having bad headaches with bidil; will stop and optimize other HF medications as above -Needs out-patient sleep study and treatment of underlying OSA -Will need prior auth for SGLT2i; can add as able as outpatient -Will discuss option of GLP-1 agonist as out-patient to aide in weight loss  #HTN: Remains mildly elevated. -Stop bidil due to HA  -Start entresto 24-26mg  BID tomorrow -Extra dose losartan 50mg  today for elevated BP now that stopping bidil -Start spironolactone 25mg  daily -Will add low dose BB tomorrow as able  #OSA: Needs home CPAP. Currently awaiting possible approval from [employer]. If unable to obtain, will need repeat sleep study.  #Morbid Obesity: BMI 44.  -Will look into GLP-1 agonist as out-patient -Diet and lifestyle modifications     For questions or updates, please contact [facility] Please consult [email or web address] for contact info under        Signed, , MD  04/18/2021, 7:50 PM

## 2021-04-18 NOTE — Progress Notes (Signed)
Progress Note   Patient: Jesse Morris CNO:[id number] DOB: October 13, 1980 DOA: 04/13/2021     5 DOS: the patient was seen and examined on 04/18/2021   Brief hospital course: Mr. Kimes is a 41 y.o. male with PMH obesity and OSA who was recently admitted to [facility] in Jan with SOB due to Influenza A.  Not needing O2 at time of discharge. Since that admission he has had persistent SOB which worsened in last couple of days with severe dyspnea especially on exertion and has not felt better.  He continues to have severe daytime sleepiness he did have a positive sleep study and was diagnosed with sleep apnea by pulmonology approximately 1 year ago but never received CPAP machine. He was seen in the ED, 87% on room air, underwent further work-up with CTA no acute finding, BNP normal.  He underwent echo for further work-up on admission which showed EF 45 to 50% with global hypokinesis and moderate LVH. He was evaluated by cardiology and underwent right/left heart cath on 04/17/2021.  He had very minimal CAD (15% stenosis in mid LAD to distal LAD).  Mildly elevated LVEDP, normal PCWP, high cardiac output, borderline pulmonary hypertension.  He was recommended to continue on treatment for his underlying sleep apnea.  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 underlying pulmonary HTN contributing due to uncontrolled OSA - has been weaned to RA  Acute systolic (congestive) heart failure (HCC) - low normal EF 45-50%; mild pulm HTN on R/L heart cath - cardiology following - possibly did not tolerate bidil started on 2/6 as he had severe HA today (will treat HA, but cardiology also discontinuing med now too) - has been on losartan but appears plan is  for Entresto (need to follow up with patient if this is affordable as per pharmacy eligibility note) - Farxiga and Jardiance also need prior auth  OSA (obstructive sleep apnea)- (present on admission) - Previously diagnosed with OSA.  Apparently has had difficulty obtaining home CPAP -He does endorse that he sleeps better when using CPAP in the hospital -Sleep study was a little over a year ago he states, which was arranged outpatient by his PCP previously; he does endorse that he would be compliant if able to obtain the machine - trying to obtain NIV settings from RT in efforts to send to [employer] - CM assisting to try and get CPAP for discharge   Pulmonary hypertension (HCC) - s/p right/left heart cath on 04/17/21 - Mildly elevated LVEDP, normal PCWP, high cardiac output, borderline pulmonary hypertension - trying to arrange outpt CPAP machine  Elevated troponin Troponin borderline elevated.  Could be demand ischemia in the setting of hypoxia.  HTN (hypertension)- (present on admission) - regimen being adjusted per cardiology still - see CHF  Normocytic anemia - baseline appears to be approximately 11-12 g/dL - currently at baseline - trend for now - B12 low, supplementation started   Hypokalemia - Replete as needed  Mixed hyperlipidemia- (present on admission) -LDL 144 - ASCVD max 4-5%. Will defer need for statin to cardiology. Minimal nonobs CAD on cath  Obesity, Class III, BMI 40-49.9 (morbid obesity) (HCC) - Complicates overall prognosis and care - Body mass index is 43.44 kg/m. - Weight Loss and Dietary  Counseling given      Subjective: Patient seen this morning having severe headache.  He was unable to even open his eyes for evaluation.  Denied any nuchal rigidity, fevers, back pain. Appears this may be due to BiDil which was started yesterday.  This was discontinued per cardiology today and I will try and also treat headache with other medications at this  time.  Physical Exam: Vitals:   04/18/21 0420 04/18/21 0601 04/18/21 0806 04/18/21 1332  BP:  (!) 141/97  110/71  Pulse:  73  90  Resp:  16  16  Temp:  98 F (36.7 C)  98.7 F (37.1 C)  TempSrc:  Oral  Oral  SpO2:  93% 95% 100%  Weight: (!) 147.5 kg     Height:      Physical Exam Constitutional:      General: He is not in acute distress.    Appearance: Normal appearance. He is obese.  HENT:     Head: Normocephalic and atraumatic.     Mouth/Throat:     Mouth: Mucous membranes are moist.  Eyes:     Extraocular Movements: Extraocular movements intact.  Cardiovascular:     Rate and Rhythm: Normal rate and regular rhythm.     Heart sounds: Normal heart sounds.  Pulmonary:     Effort: Pulmonary effort is normal. No respiratory distress.     Breath sounds: Normal breath sounds. No wheezing.  Abdominal:     General: Bowel sounds are normal. There is no distension.     Palpations: Abdomen is soft.     Tenderness: There is no abdominal tenderness.  Musculoskeletal:        General: Normal range of motion.     Cervical back: Normal range of motion and neck supple.  Skin:    General: Skin is warm and dry.  Neurological:     General: No focal deficit present.     Mental Status: He is alert.  Psychiatric:        Mood and Affect: Mood normal.        Behavior: Behavior normal.   Data Reviewed:  I have Reviewed nursing notes, Vitals, and Lab results since pt's last encounter. Pertinent lab results K 3.5, Hgb 10.8 I have ordered test including BMP, CBC, Mg I have reviewed the last note from all staff from past 24 hours,  I have discussed pt's care plan and test results with nursing staff, CM.   Family Communication: mother  Disposition: Status is: Inpatient Remains inpatient appropriate because: Treatment as outlined in A&P     Planned Discharge Destination: Home hopefully 2/8 or 2/9     Author: Lewie Chamber, MD 04/18/2021 2:37 PM  For on call review  [email or web address].

## 2021-04-18 NOTE — Progress Notes (Signed)
Progress Note  Patient Name: Jesse Morris Date of Encounter: 04/18/2021  [facility] Cardiologist: None   Subjective   Complains of a bad HA this afternoon. Otherwise no chest pain.   Inpatient Medications    Scheduled Meds:  enoxaparin (LOVENOX) injection  60 mg Subcutaneous Q24H   ferrous sulfate  325 mg Oral Q breakfast   folic acid  1 mg Oral Daily   furosemide  20 mg Oral Daily   isosorbide-hydrALAZINE  1 tablet Oral TID   losartan  50 mg Oral Daily   mometasone-formoterol  2 puff Inhalation BID   multivitamin with minerals  1 tablet Oral Daily   potassium chloride  40 mEq Oral Once   thiamine  100 mg Oral Daily   Or   thiamine  100 mg Intravenous Daily   umeclidinium bromide  1 puff Inhalation Daily   vitamin B-12  1,000 mcg Oral Daily   Continuous Infusions:  PRN Meds: acetaminophen **OR** acetaminophen, albuterol, labetalol, ondansetron **OR** ondansetron (ZOFRAN) IV   Vital Signs    Vitals:   04/17/21 2215 04/18/21 0420 04/18/21 0601 04/18/21 0806  BP: 116/83  (!) 141/97   Pulse: 85  73   Resp: 18  16   Temp: 98.9 F (37.2 C)  98 F (36.7 C)   TempSrc: Oral  Oral   SpO2: 95%  93% 95%  Weight:  (!) 147.5 kg    Height:        Intake/Output Summary (Last 24 hours) at 04/18/2021 1041 Last data filed at 04/17/2021 1600 Gross per 24 hour  Intake 360 ml  Output --  Net 360 ml   Last 3 Weights 04/18/2021 04/17/2021 04/15/2021  Weight (lbs) 325 lb 2.9 oz 321 lb 6.9 oz 315 lb 4.8 oz  Weight (kg) 147.5 kg 145.8 kg 143.019 kg      Telemetry    NSR - Personally Reviewed  ECG    No new tracing - Personally Reviewed  Physical Exam   GEN: Laying in bed, comfortable Neck: No JVD Cardiac: RRR, no murmurs, rubs, or gallops.  Respiratory: Clear to auscultation bilaterally. GI: Obese, soft, ND, NTTP MS: No edema; No deformity. Neuro:  Nonfocal  Psych: Normal affect   Labs    High Sensitivity Troponin:   Recent Labs  Lab 04/13/21 2234 04/14/21 0155   TROPONINIHS 66* 79*     Chemistry Recent Labs  Lab 04/13/21 1857 04/13/21 1920 04/16/21 0417 04/17/21 0342 04/17/21 1044 04/17/21 1045 04/18/21 0418  NA 139   < > 138 138 140 139 138  K 3.5   < > 3.4* 3.3* 3.6 3.6 3.5  CL 102   < > 98 99  --   --  105  CO2 29   < > 32 31  --   --  29  GLUCOSE 118*   < > 99 92  --   --  113*  BUN 14   < > 14 15  --   --  12  CREATININE 0.98   < > 1.04 1.05  --   --  0.94  CALCIUM 8.8*   < > 8.9 8.6*  --   --  8.5*  MG  --    < > 2.1 1.9  --   --  2.1  PROT 7.3  --   --   --   --   --   --   ALBUMIN 3.8  --   --   --   --   --   --  AST 29  --   --   --   --   --   --   ALT 26  --   --   --   --   --   --   ALKPHOS 43  --   --   --   --   --   --   BILITOT 0.1*  --   --   --   --   --   --   GFRNONAA >60   < > >60 >60  --   --  >60  ANIONGAP 8   < > 8 8  --   --  4*   < > = values in this interval not displayed.    Lipids  Recent Labs  Lab 04/15/21 0947  CHOL 228*  TRIG 121  HDL 60  LDLCALC 144*  CHOLHDL 3.8    Hematology Recent Labs  Lab 04/16/21 0417 04/17/21 0342 04/17/21 1044 04/17/21 1045 04/18/21 0418  WBC 5.5 7.0  --   --  6.7  RBC 4.18* 3.78*  --   --  3.73*  HGB 12.0* 10.8* 11.9* 11.9* 10.8*  HCT 38.4* 35.2* 35.0* 35.0* 34.7*  MCV 91.9 93.1  --   --  93.0  MCH 28.7 28.6  --   --  29.0  MCHC 31.3 30.7  --   --  31.1  RDW 15.5 15.3  --   --  15.4  PLT 267 236  --   --  227   Thyroid No results for input(s): TSH, FREET4 in the last 168 hours.  BNP Recent Labs  Lab 04/13/21 1858  BNP 31.6    DDimer  Recent Labs  Lab 04/13/21 1857  DDIMER 0.48     Radiology    CARDIAC CATHETERIZATION  Result Date: 04/17/2021   Mid LAD to Dist LAD lesion is 15% stenosed. 1. Normal PCWP and RA pressure, mildly elevated LVEDP. 2. High cardiac output. 3. Borderline pulmonary hypertension. 4. Minimal CAD Nonischemic cardiomyopathy.  Needs workup for sleep apnea.    Cardiac Studies   RHC/LHC 04/18/21:   Mid LAD to Dist  LAD lesion is 15% stenosed.   1. Normal PCWP and RA pressure, mildly elevated LVEDP.  2. High cardiac output.  3. Borderline pulmonary hypertension.  4. Minimal CAD   Nonischemic cardiomyopathy.  Needs workup for sleep apnea.   TTE 04/14/21: IMPRESSIONS     1. Global hypokinesis worse in inferior base . Left ventricular ejection  fraction, by estimation, is 45 to 50%. The left ventricle has mildly  decreased function. The left ventricle demonstrates global hypokinesis.  The left ventricular internal cavity  size was mildly dilated. There is moderate left ventricular hypertrophy.  Left ventricular diastolic parameters were normal.   2. Right ventricular systolic function is normal. The right ventricular  size is normal.   3. Left atrial size was moderately dilated.   4. The mitral valve is normal in structure. No evidence of mitral valve  regurgitation. No evidence of mitral stenosis.   5. The aortic valve is tricuspid. Aortic valve regurgitation is not  visualized. No aortic stenosis is present.   6. Aortic dilatation noted. There is mild dilatation of the ascending  aorta, measuring 41 mm.   7. The inferior vena cava is normal in size with greater than 50%  respiratory variability, suggesting right atrial pressure of 3 mmHg.      Patient Profile     41 y.o. male obesity  and OSA who presented with worsening SOB and DOE found to have EF 45-50% on TTE with global hypokinesis for which Cardiology was consulted. RHC/LHC on 04/17/21 without evidence of obstructive CAD. Filling pressures normal.  Assessment & Plan    #Acute Systolic Heart Failure: #Nonischemic CM: Patient presented with worsening dyspnea found to have EF 45-50% with global hypokinesis. LHC 2/6 without obstructive disease. RHC with normal PAP (m79mmhg), normal PCWP 25mmHg, high output state with CO 9.08. Will need medical management, aggressive blood pressure control, and management of underlying OSA. -Continue lasix  20mg  daily -Transition to entresto 24/26mg  BID tomorrow -Start spironolactone 25mg  daily -Will add low dose BB tomorrow AM pending blood pressures -Having bad headaches with bidil; will stop and optimize other HF medications as above -Needs out-patient sleep study and treatment of underlying OSA -Will need prior auth for SGLT2i; can add as able as outpatient -Will discuss option of GLP-1 agonist as out-patient to aide in weight loss  #HTN: Remains mildly elevated. -Stop bidil due to HA  -Start entresto 24-26mg  BID tomorrow -Extra dose losartan 50mg  today for elevated BP now that stopping bidil -Start spironolactone 25mg  daily -Will add low dose BB tomorrow as able  #OSA: Needs home CPAP. Currently awaiting possible approval from [employer]. If unable to obtain, will need repeat sleep study.  #Morbid Obesity: BMI 44.  -Will look into GLP-1 agonist as out-patient -Diet and lifestyle modifications     For questions or updates, please contact [place] Please consult [email or web address] for contact info under        Signed, Freada Bergeron, MD  04/18/2021, 10:41 AM

## 2021-04-18 NOTE — Progress Notes (Signed)
PT  CPAP settings is 11

## 2021-04-19 LAB — CBC WITH DIFFERENTIAL/PLATELET
Abs Immature Granulocytes: 0.01 10*3/uL (ref 0.00–0.07)
Basophils Absolute: 0.1 10*3/uL (ref 0.0–0.1)
Basophils Relative: 1 %
Eosinophils Absolute: 0.2 10*3/uL (ref 0.0–0.5)
Eosinophils Relative: 4 %
HCT: 38.2 % — ABNORMAL LOW (ref 39.0–52.0)
Hemoglobin: 12.1 g/dL — ABNORMAL LOW (ref 13.0–17.0)
Immature Granulocytes: 0 %
Lymphocytes Relative: 30 %
Lymphs Abs: 2 10*3/uL (ref 0.7–4.0)
MCH: 29.3 pg (ref 26.0–34.0)
MCHC: 31.7 g/dL (ref 30.0–36.0)
MCV: 92.5 fL (ref 80.0–100.0)
Monocytes Absolute: 0.6 10*3/uL (ref 0.1–1.0)
Monocytes Relative: 9 %
Neutro Abs: 3.7 10*3/uL (ref 1.7–7.7)
Neutrophils Relative %: 56 %
Platelets: 254 10*3/uL (ref 150–400)
RBC: 4.13 MIL/uL — ABNORMAL LOW (ref 4.22–5.81)
RDW: 15.3 % (ref 11.5–15.5)
WBC: 6.6 10*3/uL (ref 4.0–10.5)
nRBC: 0 % (ref 0.0–0.2)

## 2021-04-19 LAB — MAGNESIUM: Magnesium: 2.3 mg/dL (ref 1.7–2.4)

## 2021-04-19 LAB — BASIC METABOLIC PANEL
Anion gap: 7 (ref 5–15)
BUN: 12 mg/dL (ref 6–20)
CO2: 27 mmol/L (ref 22–32)
Calcium: 8.8 mg/dL — ABNORMAL LOW (ref 8.9–10.3)
Chloride: 103 mmol/L (ref 98–111)
Creatinine, Ser: 0.92 mg/dL (ref 0.61–1.24)
GFR, Estimated: >60 mL/min (ref >60)
Glucose, Bld: 94 mg/dL (ref 70–99)
Potassium: 3.7 mmol/L (ref 3.5–5.1)
Sodium: 137 mmol/L (ref 135–145)

## 2021-04-19 MED ORDER — FUROSEMIDE 20 MG PO TABS: 20.0000 mg | ORAL_TABLET | Freq: Every day | ORAL | 0 refills | Status: DC

## 2021-04-19 MED ORDER — SPIRONOLACTONE 25 MG PO TABS: 25.0000 mg | ORAL_TABLET | Freq: Every day | ORAL | 0 refills | Status: DC

## 2021-04-19 MED ORDER — LOSARTAN POTASSIUM 100 MG PO TABS: 100.0000 mg | ORAL_TABLET | Freq: Every day | ORAL | 0 refills | Status: DC

## 2021-04-19 MED ORDER — CYANOCOBALAMIN 1000 MCG PO TABS: 1000.0000 ug | ORAL_TABLET | Freq: Every day | ORAL | Status: DC

## 2021-04-19 MED ORDER — LOSARTAN POTASSIUM 50 MG PO TABS
100.0000 mg | ORAL_TABLET | Freq: Every day | ORAL | Status: DC
  Administered 2021-04-19: 100 mg via ORAL
  Filled 2021-04-19: qty 2

## 2021-04-19 NOTE — Discharge Summary (Signed)
Physician Discharge Summary  Jesse Morris WYS:[id number] DOB: 11-Feb-1981 DOA: 04/13/2021  PCP: Norm Salt, PA  Admit date: 04/13/2021 Discharge date: 04/19/2021  Time spent: 35 minutes   Discharge Diagnoses:  Active Problems:   OSA (obstructive sleep apnea)   HTN (hypertension)   Obesity, Class III, BMI 40-49.9 (morbid obesity) (HCC)   Mixed hyperlipidemia   Hypokalemia   Elevated troponin   Normocytic anemia   Hypoxia   Pulmonary hypertension (HCC)   Acute systolic (congestive) heart failure [facility])   Discharge Condition: Stable   Filed Weights   04/15/21 1745 04/17/21 0400 04/18/21 0420  Weight: (!) 143 kg (!) 145.8 kg (!) 147.5 kg    History of present illness:  Jesse Morris is a 41 y.o. male with PMH obesity and OSA who was recently admitted to [facility] in Jan with SOB due to Influenza A.  Not needing O2 at time of discharge. Since that admission he has had persistent SOB which worsened in last couple of days with severe dyspnea especially on exertion and has not felt better.  He continues to have severe daytime sleepiness he did have a positive sleep study and was diagnosed with sleep apnea by pulmonology approximately 1 year ago but never received CPAP machine. He was seen in the ED, 87% on room air, underwent further work-up with CTA no acute finding, BNP normal.  He underwent echo for further work-up on admission which showed EF 45 to 50% with global hypokinesis and moderate LVH. He was evaluated by cardiology and underwent right/left heart cath on 04/17/2021.  He had very minimal CAD (15% stenosis in mid LAD to distal LAD).  Mildly elevated LVEDP, normal PCWP, high cardiac output, borderline pulmonary hypertension.  He was recommended to continue on treatment for his underlying sleep apnea.  CPAP machine has been arranged for him to take home.  Medications at discharge were limited secondary to cost issues.  Patient is to follow-up with primary care physician in 1 to 2 weeks  and cardiology in 2 to 4 weeks.  Insurance coverage of medications was a limiting factor at discharge.  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 vs underlying pulmonary HTN contributing due to uncontrolled OSA - has been weaned to RA   Acute systolic (congestive) heart failure (HCC) - low normal EF 45-50%; mild pulm HTN on R/L heart cath - cardiology following - possibly did not tolerate bidil started on 2/6 as he had severe HA today (will treat HA, but cardiology also discontinuing med now too) - has been on losartan but appears plan is for Entresto (need to follow up with patient if this is affordable as per pharmacy eligibility note) however patient cannot afford Sherryll Burger - Farxiga and Jardiance also need prior auth to be followed up as an outpatient   OSA (obstructive sleep apnea)- (present on admission) - Previously diagnosed with OSA.  Apparently has had difficulty obtaining home CPAP -He does endorse that he sleeps better when using CPAP in the hospital -Sleep study was a little over a year ago he states, which was arranged outpatient by his PCP previously; he does endorse that he would be compliant if able to obtain the machine -CPAP has been arranged for home usage  Pulmonary hypertension (HCC) -  s/p right/left heart cath on 04/17/21 - Mildly elevated LVEDP, normal PCWP, high cardiac output, borderline pulmonary hypertension   Elevated troponin Troponin borderline elevated.  Could be demand ischemia in the setting of hypoxia.   HTN (hypertension)- (present on admission) - regimen being adjusted per cardiology we will continue as an outpatient - see CHF   Normocytic anemia - baseline appears to be approximately  11-12 g/dL - currently at baseline - trend for now - B12 low, supplementation started    Hypokalemia - Replete as needed   Mixed hyperlipidemia- (present on admission) -LDL 144 - ASCVD max 4-5%. Will defer need for statin to cardiology. Minimal nonobs CAD on cath   Obesity, Class III, BMI 40-49.9 (morbid obesity) (HCC) - Complicates overall prognosis and care - Body mass index is 43.44 kg/m. - Weight Loss and Dietary Counseling given  Discharge Exam: Vitals:   04/19/21 0520 04/19/21 0827  BP: 132/83   Pulse: 85   Resp: 20   Temp: 98.4 F (36.9 C)   SpO2: 95% 96%    General: Alert and oriented x4 no apparent distress cooperative and family Cardiovascular: Regular rate and rhythm without murmurs rubs or gallops Respiratory: Clear to auscultation bilaterally no wheezes rhonchi rales  Discharge Instructions   Discharge Instructions     Diet - low sodium heart healthy   Complete by: As directed    Discharge instructions   Complete by: As directed    Follow-up with primary care physician in 1 to 2 weeks  Follow-up with cardiology in 2 to 4 weeks   Increase activity slowly   Complete by: As directed       Allergies as of 04/19/2021   Not on File      Medication List     STOP taking these medications    predniSONE 20 MG tablet Commonly known as: DELTASONE       TAKE these medications    albuterol 108 (90 Base) MCG/ACT inhaler Commonly known as: VENTOLIN HFA Inhale 1 puff into the lungs every 6 (six) hours as needed for wheezing or shortness of breath. This was his mothers inhaler. Pt only used this once.   cyanocobalamin 1000 MCG tablet Take 1 tablet (1,000 mcg total) by mouth daily. Start taking on: April 20, 2021   furosemide 20 MG tablet Commonly known as: LASIX Take 1 tablet (20 mg total) by mouth daily. Start taking on: April 20, 2021   losartan 100 MG tablet Commonly known as: COZAAR Take 1 tablet (100 mg total) by mouth daily. What  changed:  medication strength how much to take   spironolactone 25 MG tablet Commonly known as: ALDACTONE Take 1 tablet (25 mg total) by mouth daily. Start taking on: April 20, 2021               Durable Medical Equipment  (From admission, onward)           Start     Ordered   04/18/21 1403  For home use only DME continuous positive airway pressure (CPAP)  Once       Question Answer Comment  Length of Need Lifetime   Patient has OSA or probable OSA Yes   Is the patient currently using CPAP in the home No   If yes (to question two) Determine DME provider and inform them of any new orders/settings   Settings 11-15   CPAP supplies needed Mask, headgear, cushions, filters, heated tubing and water chamber  04/18/21 1404           Not on File    The results of significant diagnostics from this hospitalization (including imaging, microbiology, ancillary and laboratory) are listed below for reference.    Significant Diagnostic Studies: CT Angio Chest PE W and/or Wo Contrast  Result Date: 04/13/2021 CLINICAL DATA:  Pulmonary embolism (PE) suspected, neg D-dimer EXAM: CT ANGIOGRAPHY CHEST WITH CONTRAST TECHNIQUE: Multidetector CT imaging of the chest was performed using the standard protocol during bolus administration of intravenous contrast. Multiplanar CT image reconstructions and MIPs were obtained to evaluate the vascular anatomy. RADIATION DOSE REDUCTION: This exam was performed according to the departmental dose-optimization program which includes automated exposure control, adjustment of the mA and/or kV according to patient size and/or use of iterative reconstruction technique. CONTRAST:  OMNIPAQUE IOHEXOL 350 MG/ML SOLN COMPARISON:  None. FINDINGS: Cardiovascular: Mild motion artifact at the lung bases slightly limits the quality of the examination. The examination, however, is still diagnostic. No intraluminal filling defect is identified to suggest acute  pulmonary embolism. Central pulmonary arteries are of normal caliber. No significant coronary artery calcification. Global cardiac size within normal limits. No pericardial effusion. The thoracic aorta is unremarkable. Mediastinum/Nodes: No enlarged mediastinal, hilar, or axillary lymph nodes. Thyroid gland, trachea, and esophagus demonstrate no significant findings. Lungs/Pleura: Lungs are clear. No pleural effusion or pneumothorax. Upper Abdomen: No acute abnormality. Musculoskeletal: Osseous structures are age-appropriate. No acute bone abnormality. Review of the MIP images confirms the above findings. IMPRESSION: No pulmonary embolism.  No acute intrathoracic pathology identified. Electronically Signed   By: Helyn Numbers M.D.   On: 04/13/2021 22:32   CARDIAC CATHETERIZATION  Result Date: 04/17/2021   Mid LAD to Dist LAD lesion is 15% stenosed. 1. Normal PCWP and RA pressure, mildly elevated LVEDP. 2. High cardiac output. 3. Borderline pulmonary hypertension. 4. Minimal CAD Nonischemic cardiomyopathy.  Needs workup for sleep apnea.   DG Chest Portable 1 View  Result Date: 04/13/2021 CLINICAL DATA:  Short of breath, cough, hypoxia EXAM: PORTABLE CHEST 1 VIEW COMPARISON:  03/15/2021 FINDINGS: Single frontal view of the chest demonstrates an unremarkable cardiac silhouette. No airspace disease, effusion, or pneumothorax. No acute bony abnormalities. IMPRESSION: 1. No acute intrathoracic process. Electronically Signed   By: Sharlet Salina M.D.   On: 04/13/2021 19:53   ECHOCARDIOGRAM COMPLETE  Result Date: 04/14/2021    ECHOCARDIOGRAM REPORT   Patient Name:   Jesse Morris Date of Exam: 04/14/2021 Medical Rec #:  [id number]    Height:       72.0 in Accession #:    [id number]   Weight:       320.0 lb Date of Birth:  [birth year]    BSA:          2.602 m Patient Age:    40 years     BP:           140/95 mmHg Patient Gender: M            HR:           80 bpm. Exam Location:  Inpatient Procedure: 2D Echo Indications:     Dyspnea  History:        Patient has no prior history of Echocardiogram examinations.                 Risk Factors:Sleep Apnea.  Sonographer:    Eduard Roux Referring Phys: [phone] JARED M GARDNER IMPRESSIONS  1. Global hypokinesis worse in inferior base . Left  ventricular ejection fraction, by estimation, is 45 to 50%. The left ventricle has mildly decreased function. The left ventricle demonstrates global hypokinesis. The left ventricular internal cavity size was mildly dilated. There is moderate left ventricular hypertrophy. Left ventricular diastolic parameters were normal.  2. Right ventricular systolic function is normal. The right ventricular size is normal.  3. Left atrial size was moderately dilated.  4. The mitral valve is normal in structure. No evidence of mitral valve regurgitation. No evidence of mitral stenosis.  5. The aortic valve is tricuspid. Aortic valve regurgitation is not visualized. No aortic stenosis is present.  6. Aortic dilatation noted. There is mild dilatation of the ascending aorta, measuring 41 mm.  7. The inferior vena cava is normal in size with greater than 50% respiratory variability, suggesting right atrial pressure of 3 mmHg. FINDINGS  Left Ventricle: Global hypokinesis worse in inferior base. Left ventricular ejection fraction, by estimation, is 45 to 50%. The left ventricle has mildly decreased function. The left ventricle demonstrates global hypokinesis. The left ventricular internal cavity size was mildly dilated. There is moderate left ventricular hypertrophy. Left ventricular diastolic parameters were normal. Right Ventricle: The right ventricular size is normal. No increase in right ventricular wall thickness. Right ventricular systolic function is normal. Left Atrium: Left atrial size was moderately dilated. Right Atrium: Right atrial size was normal in size. Pericardium: There is no evidence of pericardial effusion. Mitral Valve: The mitral valve is normal in  structure. No evidence of mitral valve regurgitation. No evidence of mitral valve stenosis. Tricuspid Valve: The tricuspid valve is normal in structure. Tricuspid valve regurgitation is not demonstrated. No evidence of tricuspid stenosis. Aortic Valve: The aortic valve is tricuspid. Aortic valve regurgitation is not visualized. No aortic stenosis is present. Aortic valve peak gradient measures 5.5 mmHg. Pulmonic Valve: The pulmonic valve was normal in structure. Pulmonic valve regurgitation is not visualized. No evidence of pulmonic stenosis. Aorta: Aortic dilatation noted. There is mild dilatation of the ascending aorta, measuring 41 mm. Venous: The inferior vena cava is normal in size with greater than 50% respiratory variability, suggesting right atrial pressure of 3 mmHg. IAS/Shunts: No atrial level shunt detected by color flow Doppler.  LEFT VENTRICLE PLAX 2D LVIDd:         5.30 cm      Diastology LVIDs:         4.80 cm      LV e' lateral:   9.68 cm/s LV PW:         1.40 cm      LV E/e' lateral: 6.7 LV IVS:        1.50 cm LVOT diam:     2.60 cm LV SV:         139 LV SV Index:   53 LVOT Area:     5.31 cm  LV Volumes (MOD) LV vol d, MOD A4C: 250.5 ml LV vol s, MOD A4C: 179.0 ml LV SV MOD A4C:     250.5 ml RIGHT VENTRICLE             IVC RV S prime:     10.40 cm/s  IVC diam: 2.40 cm LEFT ATRIUM             Index        RIGHT ATRIUM           Index LA diam:        4.70 cm 1.81 cm/m   RA Area:     11.90  cm LA Vol (A2C):   83.9 ml 32.25 ml/m  RA Volume:   25.80 ml  9.92 ml/m LA Vol (A4C):   92.4 ml 35.52 ml/m LA Biplane Vol: 88.4 ml 33.98 ml/m  AORTIC VALVE                 PULMONIC VALVE AV Area (Vmax): 6.13 cm     PV Vmax:       0.82 m/s AV Vmax:        117.00 cm/s  PV Peak grad:  2.7 mmHg AV Peak Grad:   5.5 mmHg LVOT Vmax:      135.00 cm/s LVOT Vmean:     85.000 cm/s LVOT VTI:       0.262 m  AORTA Ao Root diam: 4.10 cm Ao Asc diam:  4.10 cm MITRAL VALVE MV Area (PHT): 3.27 cm    SHUNTS MV Decel Time: 232  msec    Systemic VTI:  0.26 m MV E velocity: 64.60 cm/s  Systemic Diam: 2.60 cm MV A velocity: 81.10 cm/s MV E/A ratio:  0.80 Charlton Haws MD Electronically signed by Charlton Haws MD Signature Date/Time: 04/14/2021/2:58:28 PM    Final    ECHOCARDIOGRAM LIMITED BUBBLE STUDY  Result Date: 04/16/2021    ECHOCARDIOGRAM LIMITED REPORT   Patient Name:   Jesse Morris Date of Exam: 04/16/2021 Medical Rec #:  [id number]    Height:       72.0 in Accession #:    [id number]   Weight:       315.3 lb Date of Birth:  1980-07-02    BSA:          2.585 m Patient Age:    40 years     BP:           156/103 mmHg Patient Gender: M            HR:           77 bpm. Exam Location:  Inpatient Procedure: 2D Echo and Limited Echo Indications:    Hypoxia  History:        Patient has prior history of Echocardiogram examinations, most                 recent 04/14/2020. Risk Factors:Hypertension and Sleep Apnea.  Sonographer:    Eduard Roux Referring Phys: [id number]ade  Sonographer Comments: Image acquisition challenging due to patient body habitus. IMPRESSIONS  1. Negative bubble study supine and upright No evidence of platyonea orthodeoxia Mild LVE moderate LVH EF 45-50%. FINDINGS  Additional Comments: Negative bubble study supine and upright No evidence of platyonea orthodeoxia Mild LVE moderate LVH EF 45-50%. Charlton Haws MD Electronically signed by Charlton Haws MD Signature Date/Time: 04/16/2021/10:50:54 AM    Final     Microbiology: Recent Results (from the past 240 hour(s))  Blood culture (routine x 2)     Status: None   Collection Time: 04/13/21  7:02 PM   Specimen: Left Antecubital; Blood  Result Value Ref Range Status   Specimen Description   Final    LEFT ANTECUBITAL BLOOD Performed at [facility], [address] [place], [place] [address]    Special Requests   Final    BOTTLES DRAWN AEROBIC AND ANAEROBIC Blood Culture adequate volume Performed at [facility], [address] [place], [place] [address]    Culture   Final    NO GROWTH 5 DAYS Performed at [facility], [address], [place], [place] [address]    Report Status 04/18/2021 FINAL  Final  Blood culture (routine x 2)     Status: None   Collection Time: 04/13/21  7:02 PM   Specimen: BLOOD  Result Value Ref Range Status   Specimen Description   Final    BLOOD BLOOD LEFT FOREARM Performed at [facility], [address] [place], [place] [address]    Special Requests   Final    BOTTLES DRAWN AEROBIC AND ANAEROBIC Blood Culture results may not be optimal due to an excessive volume of blood received in culture bottles Performed at [facility], [address] [place], [place] [address]    Culture   Final    NO GROWTH 5 DAYS Performed at [facility], [address], [place], [place] [address]    Report Status 04/18/2021 FINAL  Final  Resp Panel by RT-PCR (Flu A&B, Covid)     Status: None   Collection Time: 04/13/21  7:05 PM   Specimen: Nasopharyngeal(NP) swabs in vial transport medium  Result Value Ref Range Status   SARS Coronavirus 2 by RT PCR NEGATIVE NEGATIVE Final    Comment: (NOTE) SARS-CoV-2 target nucleic acids are NOT DETECTED.  The SARS-CoV-2 RNA is generally detectable in upper respiratory specimens during the acute phase of infection. The lowest concentration of SARS-CoV-2 viral copies this assay can detect is 138 copies/mL. A negative result does not preclude SARS-Cov-2 infection and should not be used as the sole basis for treatment or other patient management decisions. A negative result may occur with  improper specimen collection/handling, submission of specimen other than nasopharyngeal swab, presence of viral mutation(s) within the areas targeted by this assay, and inadequate number of viral copies(<138 copies/mL). A negative result must be combined with clinical observations, patient history, and  epidemiological information. The expected result is Negative.  Fact Sheet for Patients:  BloggerCourse.com  Fact Sheet for Healthcare Providers:  SeriousBroker.it  This test is no t yet approved or cleared by the [place] FDA and  has been authorized for detection and/or diagnosis of SARS-CoV-2 by FDA under an Emergency Use Authorization (EUA). This EUA will remain  in effect (meaning this test can be used) for the duration of the COVID-19 declaration under Section 564(b)(1) of the Act, 21 U.S.C.section 360bbb-3(b)(1), unless the authorization is terminated  or revoked sooner.       Influenza A by PCR NEGATIVE NEGATIVE Final   Influenza B by PCR NEGATIVE NEGATIVE Final    Comment: (NOTE) The Xpert Xpress SARS-CoV-2/FLU/RSV plus assay is intended as an aid in the diagnosis of influenza from Nasopharyngeal swab specimens and should not be used as a sole basis for treatment. Nasal washings and aspirates are unacceptable for Xpert Xpress SARS-CoV-2/FLU/RSV testing.  Fact Sheet for Patients: BloggerCourse.com  Fact Sheet for Healthcare Providers: SeriousBroker.it  This test is not yet approved or cleared by the [place] FDA and has been authorized for detection and/or diagnosis of SARS-CoV-2 by FDA under an Emergency Use Authorization (EUA). This EUA will remain in effect (meaning this test can be used) for the duration of the COVID-19 declaration under Section 564(b)(1) of the Act, 21 U.S.C. section 360bbb-3(b)(1), unless the authorization is terminated or revoked.  Performed at [facility], [address] [place], [place] [address]      Labs: Basic Metabolic Panel: Recent Labs  Lab 04/15/21 [phone] 04/16/21 [phone] 04/17/21 0342 04/17/21 1044 04/17/21 1045 04/18/21 0418  04/19/21 0400  NA 136 138 138 140 139 138 137  K 3.1* 3.4* 3.3* 3.6 3.6  3.5 3.7  CL 98 98 99  --   --  105 103  CO2 30 32 31  --   --  29 27  GLUCOSE 98 99 92  --   --  113* 94  BUN 11 14 15   --   --  12 12  CREATININE 0.84 1.04 1.05  --   --  0.94 0.92  CALCIUM 8.5* 8.9 8.6*  --   --  8.5* 8.8*  MG 1.8 2.1 1.9  --   --  2.1 2.3   Liver Function Tests: Recent Labs  Lab 04/13/21 1857  AST 29  ALT 26  ALKPHOS 43  BILITOT 0.1*  PROT 7.3  ALBUMIN 3.8   No results for input(s): LIPASE, AMYLASE in the last 168 hours. No results for input(s): AMMONIA in the last 168 hours. CBC: Recent Labs  Lab 04/15/21 0956 04/16/21 0417 04/17/21 0342 04/17/21 1044 04/17/21 1045 04/18/21 0418 04/19/21 0400  WBC 5.1 5.5 7.0  --   --  6.7 6.6  NEUTROABS 2.6 2.8 4.2  --   --  3.8 3.7  HGB 11.3* 12.0* 10.8* 11.9* 11.9* 10.8* 12.1*  HCT 36.5* 38.4* 35.2* 35.0* 35.0* 34.7* 38.2*  MCV 91.9 91.9 93.1  --   --  93.0 92.5  PLT 241 267 236  --   --  227 254   Cardiac Enzymes: No results for input(s): CKTOTAL, CKMB, CKMBINDEX, TROPONINI in the last 168 hours. BNP: BNP (last 3 results) Recent Labs    04/13/21 1858  BNP 31.6    ProBNP (last 3 results) No results for input(s): PROBNP in the last 8760 hours.  CBG: Recent Labs  Lab 04/16/21 1715  GLUCAP 84       Signed:  Brodie Scovell A MD.  [facility] 04/19/2021, 1:03 PM

## 2021-04-19 NOTE — TOC Progression Note (Signed)
Transition of Care [facility]) - Progression Note    Patient Details  Name: Jesse Morris MRN: [id number] Date of Birth: 1980/05/07  Transition of [facility]) CM/SW Contact  Leeroy Cha, RN Phone Number: 04/19/2021, 9:56 AM  Clinical Narrative:    Patient has cpap machine at his bedside. States he cannot afford meds,but does have pharmacy benefits through [employer].  Suugested that he use [facility] for meds.  Or go to [facility] and established there for his care and meds.   Expected Discharge Plan: Home/Self Care Barriers to Discharge: Continued Medical Work up  Expected Discharge Plan and Services Expected Discharge Plan: Home/Self Care   Discharge Planning Services: CM Consult Post Acute Care Choice: Durable Medical Equipment Living arrangements for the past 2 months: Apartment                 DME Arranged: Continuous positive airway pressure (CPAP) DME Agency: [employer] Date DME Agency Contacted: 04/17/21 Time DME Agency Contacted: 26 Representative spoke with at DME Agency: [email or web address]             Social Determinants of Health ([place]) Interventions    Readmission Risk Interventions No flowsheet data found.

## 2021-04-19 NOTE — Progress Notes (Signed)
Pt dc stable via wheelchair by RN. RN went over AVS instructions with pt, pt verbalized understanding with no further questions.

## 2021-06-12 ENCOUNTER — Ambulatory Visit: Payer: Self-pay | Admitting: Cardiology

## 2021-10-20 ENCOUNTER — Encounter (HOSPITAL_COMMUNITY): Payer: Self-pay

## 2021-10-20 ENCOUNTER — Emergency Department (HOSPITAL_COMMUNITY): Payer: Medicaid Other

## 2021-10-20 ENCOUNTER — Other Ambulatory Visit: Payer: Self-pay

## 2021-10-20 ENCOUNTER — Observation Stay (HOSPITAL_COMMUNITY)
Admission: EM | Admit: 2021-10-20 | Discharge: 2021-10-21 | Disposition: A | Discharge status: Home / Self Care | Payer: Medicaid Other | Attending: Internal Medicine | Admitting: Internal Medicine

## 2021-10-20 DIAGNOSIS — I160 Hypertensive urgency: Secondary | ICD-10-CM | POA: Diagnosis present

## 2021-10-20 DIAGNOSIS — Z8616 Personal history of COVID-19: Secondary | ICD-10-CM | POA: Insufficient documentation

## 2021-10-20 DIAGNOSIS — I110 Hypertensive heart disease with heart failure: Secondary | ICD-10-CM | POA: Diagnosis not present

## 2021-10-20 DIAGNOSIS — I10 Essential (primary) hypertension: Secondary | ICD-10-CM

## 2021-10-20 DIAGNOSIS — E876 Hypokalemia: Secondary | ICD-10-CM | POA: Diagnosis not present

## 2021-10-20 DIAGNOSIS — F17210 Nicotine dependence, cigarettes, uncomplicated: Secondary | ICD-10-CM | POA: Diagnosis not present

## 2021-10-20 DIAGNOSIS — I2510 Atherosclerotic heart disease of native coronary artery without angina pectoris: Secondary | ICD-10-CM | POA: Diagnosis present

## 2021-10-20 DIAGNOSIS — E782 Mixed hyperlipidemia: Secondary | ICD-10-CM | POA: Diagnosis present

## 2021-10-20 DIAGNOSIS — Z79899 Other long term (current) drug therapy: Secondary | ICD-10-CM | POA: Insufficient documentation

## 2021-10-20 DIAGNOSIS — I420 Dilated cardiomyopathy: Secondary | ICD-10-CM

## 2021-10-20 DIAGNOSIS — I5020 Unspecified systolic (congestive) heart failure: Secondary | ICD-10-CM | POA: Insufficient documentation

## 2021-10-20 DIAGNOSIS — F1090 Alcohol use, unspecified, uncomplicated: Secondary | ICD-10-CM | POA: Diagnosis present

## 2021-10-20 DIAGNOSIS — E66813 Obesity, class 3: Secondary | ICD-10-CM | POA: Diagnosis present

## 2021-10-20 DIAGNOSIS — R0602 Shortness of breath: Secondary | ICD-10-CM | POA: Diagnosis present

## 2021-10-20 DIAGNOSIS — R079 Chest pain, unspecified: Secondary | ICD-10-CM

## 2021-10-20 DIAGNOSIS — R778 Other specified abnormalities of plasma proteins: Secondary | ICD-10-CM | POA: Insufficient documentation

## 2021-10-20 DIAGNOSIS — R0609 Other forms of dyspnea: Secondary | ICD-10-CM

## 2021-10-20 DIAGNOSIS — I161 Hypertensive emergency: Principal | ICD-10-CM | POA: Diagnosis present

## 2021-10-20 DIAGNOSIS — G4733 Obstructive sleep apnea (adult) (pediatric): Secondary | ICD-10-CM

## 2021-10-20 DIAGNOSIS — F1290 Cannabis use, unspecified, uncomplicated: Secondary | ICD-10-CM | POA: Diagnosis present

## 2021-10-20 DIAGNOSIS — R7989 Other specified abnormal findings of blood chemistry: Secondary | ICD-10-CM | POA: Diagnosis present

## 2021-10-20 DIAGNOSIS — Z789 Other specified health status: Secondary | ICD-10-CM | POA: Diagnosis present

## 2021-10-20 HISTORY — DX: Heart failure, unspecified: I50.9

## 2021-10-20 LAB — RAPID URINE DRUG SCREEN, HOSP PERFORMED
Amphetamines: NOT DETECTED
Barbiturates: NOT DETECTED
Benzodiazepines: NOT DETECTED
Cocaine: NOT DETECTED
Opiates: NOT DETECTED
Tetrahydrocannabinol: POSITIVE — AB

## 2021-10-20 LAB — BASIC METABOLIC PANEL
Anion gap: 11 (ref 5–15)
BUN: 14 mg/dL (ref 6–20)
CO2: 25 mmol/L (ref 22–32)
Calcium: 8.9 mg/dL (ref 8.9–10.3)
Chloride: 102 mmol/L (ref 98–111)
Creatinine, Ser: 0.97 mg/dL (ref 0.61–1.24)
GFR, Estimated: >60 mL/min (ref >60)
Glucose, Bld: 145 mg/dL — ABNORMAL HIGH (ref 70–99)
Potassium: 2.9 mmol/L — ABNORMAL LOW (ref 3.5–5.1)
Sodium: 138 mmol/L (ref 135–145)

## 2021-10-20 LAB — MAGNESIUM: Magnesium: 1.8 mg/dL (ref 1.7–2.4)

## 2021-10-20 LAB — BRAIN NATRIURETIC PEPTIDE: B Natriuretic Peptide: 69.1 pg/mL (ref 0.0–100.0)

## 2021-10-20 LAB — CBC
HCT: 40 % (ref 39.0–52.0)
Hemoglobin: 12.7 g/dL — ABNORMAL LOW (ref 13.0–17.0)
MCH: 27.5 pg (ref 26.0–34.0)
MCHC: 31.8 g/dL (ref 30.0–36.0)
MCV: 86.6 fL (ref 80.0–100.0)
Platelets: 273 10*3/uL (ref 150–400)
RBC: 4.62 MIL/uL (ref 4.22–5.81)
RDW: 18.9 % — ABNORMAL HIGH (ref 11.5–15.5)
WBC: 5.2 10*3/uL (ref 4.0–10.5)
nRBC: 0 % (ref 0.0–0.2)

## 2021-10-20 LAB — TROPONIN I (HIGH SENSITIVITY)
Troponin I (High Sensitivity): 98 ng/L — ABNORMAL HIGH (ref <18)
Troponin I (High Sensitivity): 103 ng/L (ref <18)

## 2021-10-20 MED ORDER — LABETALOL HCL 5 MG/ML IV SOLN
20.0000 mg | Freq: Once | INTRAVENOUS | Status: AC
  Administered 2021-10-20: 20 mg via INTRAVENOUS
  Filled 2021-10-20: qty 4

## 2021-10-20 MED ORDER — FUROSEMIDE 40 MG PO TABS
20.0000 mg | ORAL_TABLET | Freq: Once | ORAL | Status: AC
  Administered 2021-10-20: 20 mg via ORAL
  Filled 2021-10-20: qty 1

## 2021-10-20 MED ORDER — POTASSIUM CHLORIDE 20 MEQ PO PACK
40.0000 meq | PACK | Freq: Once | ORAL | Status: AC
  Administered 2021-10-20: 40 meq via ORAL
  Filled 2021-10-20: qty 2

## 2021-10-20 MED ORDER — SPIRONOLACTONE 25 MG PO TABS
25.0000 mg | ORAL_TABLET | Freq: Once | ORAL | Status: AC
  Administered 2021-10-20: 25 mg via ORAL
  Filled 2021-10-20: qty 1

## 2021-10-20 MED ORDER — LOSARTAN POTASSIUM 25 MG PO TABS
100.0000 mg | ORAL_TABLET | Freq: Once | ORAL | Status: AC
  Administered 2021-10-20: 100 mg via ORAL
  Filled 2021-10-20: qty 4

## 2021-10-20 NOTE — ED Provider Notes (Signed)
[facility]-EMERGENCY DEPT Provider Note   CSN: [id number] Arrival date & time: 10/20/21  1654     History  Chief Complaint  Patient presents with   Chest Pain   Shortness of Breath    Jesse Morris is a 41 y.o. male with a history of OSA and CHF who presents with several weeks of worsening dyspnea in addition to chest pain that started this morning at 8 AM.  Chest pain described as sharp, worse with exertion.  Radiates in a "shooting" fashion to the left arm.  Shortness of breath is worse laying down but is gotten to the point where its debilitating when he is up and even at rest now.  He has albuterol that he uses at home to good effect.  States that his CPAP mask "fell apart" few days ago so he has not been able to use it.  He acknowledges that he is not adherent to his medication regimen.  Review of systems negative for fevers, abdominal pain, nausea, vomiting, diarrhea, leg swelling, cough.  Medical history: CHF - Losartan occasionally OSA - CPAP although machine recently broke  Social history: - Lives in [place] with wife and 4 children - Recently lost job and insurance - Drinks 4-5 drinks per day - Does not smoke tobacco - Uses marijuana  Feb echo and RHC/LHC 1. Global hypokinesis worse in inferior base . Left ventricular ejection  fraction, by estimation, is 45 to 50%. The left ventricle has mildly  decreased function. The left ventricle demonstrates global hypokinesis.  The left ventricular internal cavity  size was mildly dilated. There is moderate left ventricular hypertrophy.  Left ventricular diastolic parameters were normal.    Mid LAD to Dist LAD lesion is 15% stenosed.   1. Normal PCWP and RA pressure, mildly elevated LVEDP.  2. High cardiac output.  3. Borderline pulmonary hypertension.  4. Minimal CAD   Nonischemic cardiomyopathy.  Needs workup for sleep apnea.   Chest Pain Associated symptoms: shortness of breath   Shortness  of Breath Associated symptoms: chest pain        Home Medications Prior to Admission medications   Medication Sig Start Date End Date Taking? Authorizing Provider  albuterol (PROVENTIL HFA;VENTOLIN HFA) 108 (90 Base) MCG/ACT inhaler Inhale 1 puff into the lungs every 6 (six) hours as needed for wheezing or shortness of breath. This was his mothers inhaler. Pt only used this once.   Yes [provider]  losartan (COZAAR) 100 MG tablet Take 1 tablet (100 mg total) by mouth daily. 04/19/21  Yes Haydee Monica, MD  furosemide (LASIX) 20 MG tablet Take 1 tablet (20 mg total) by mouth daily. Patient not taking: Reported on 10/20/2021 04/20/21   Haydee Monica, MD  spironolactone (ALDACTONE) 25 MG tablet Take 1 tablet (25 mg total) by mouth daily. Patient not taking: Reported on 10/20/2021 04/20/21   Haydee Monica, MD  vitamin B-12 1000 MCG tablet Take 1 tablet (1,000 mcg total) by mouth daily. Patient not taking: Reported on 10/20/2021 04/20/21   Haydee Monica, MD      Allergies    Patient has no known allergies.    Review of Systems   Review of Systems  Respiratory:  Positive for shortness of breath.   Cardiovascular:  Positive for chest pain.  All other systems reviewed and are negative.   Physical Exam Updated Vital Signs BP (!) 178/130   Pulse 72   Temp 98.8 F (37.1 C)  Resp 13   Ht 6' (1.829 m)   Wt 136.1 kg   SpO2 91%   BMI 40.69 kg/m  Physical Exam Vitals and nursing note reviewed.  Constitutional:      General: He is not in acute distress.    Appearance: He is well-developed.  HENT:     Head: Normocephalic and atraumatic.  Eyes:     Conjunctiva/sclera: Conjunctivae normal.  Neck:     Vascular: No JVD.  Cardiovascular:     Rate and Rhythm: Normal rate and regular rhythm.     Heart sounds: No murmur heard.    Comments: Radial pulses 2+ bilaterally Pulmonary:     Effort: Pulmonary effort is normal. No respiratory distress.     Breath sounds: Normal  breath sounds.  Abdominal:     Palpations: Abdomen is soft.     Tenderness: There is no abdominal tenderness.  Musculoskeletal:        General: No swelling.     Cervical back: Neck supple.     Right lower leg: No edema.     Left lower leg: No edema.  Skin:    General: Skin is warm and dry.     Capillary Refill: Capillary refill takes less than 2 seconds.  Neurological:     Mental Status: He is alert.  Psychiatric:        Mood and Affect: Mood normal.     ED Results / Procedures / Treatments   Labs (all labs ordered are listed, but only abnormal results are displayed) Labs Reviewed  BASIC METABOLIC PANEL - Abnormal; Notable for the following components:      Result Value   Potassium 2.9 (*)    Glucose, Bld 145 (*)    All other components within normal limits  CBC - Abnormal; Notable for the following components:   Hemoglobin 12.7 (*)    RDW 18.9 (*)    All other components within normal limits  RAPID URINE DRUG SCREEN, HOSP PERFORMED - Abnormal; Notable for the following components:   Tetrahydrocannabinol POSITIVE (*)    All other components within normal limits  TROPONIN I (HIGH SENSITIVITY) - Abnormal; Notable for the following components:   Troponin I (High Sensitivity) 98 (*)    All other components within normal limits  TROPONIN I (HIGH SENSITIVITY) - Abnormal; Notable for the following components:   Troponin I (High Sensitivity) 103 (*)    All other components within normal limits  BRAIN NATRIURETIC PEPTIDE  MAGNESIUM    EKG EKG Interpretation  Date/Time:  Friday October 20 2021 18:05:47 EDT Ventricular Rate:  79 PR Interval:  175 QRS Duration: 102 QT Interval:  426 QTC Calculation: 489 R Axis:   38 Text Interpretation: Sinus rhythm Left atrial enlargement Nonspecific T wave abnormality Borderline prolonged QT interval Confirmed by Cathren Laine ([id number]) on 10/20/2021 6:23:33 PM  Radiology DG Chest Port 1 View  Result Date: 10/20/2021 CLINICAL DATA:   Chest pain and shortness of breath EXAM: PORTABLE CHEST 1 VIEW COMPARISON:  Radiographs 04/13/2021 FINDINGS: No focal consolidation, pleural effusion, or pneumothorax. Normal cardiomediastinal silhouette. No acute osseous abnormality. IMPRESSION: No active disease. Electronically Signed   By: Minerva Fester M.D.   On: 10/20/2021 17:50    Procedures Procedures  NSR on continuous cardiac monitoring  Medications Ordered in ED Medications  potassium chloride (KLOR-CON) packet 40 mEq (40 mEq Oral Given 10/20/21 1830)  losartan (COZAAR) tablet 100 mg (100 mg Oral Given 10/20/21 1900)  furosemide (LASIX) tablet 20 mg (20  mg Oral Given 10/20/21 1900)  spironolactone (ALDACTONE) tablet 25 mg (25 mg Oral Given 10/20/21 2003)  labetalol (NORMODYNE) injection 20 mg (20 mg Intravenous Given 10/20/21 2104)    ED Course/ Medical Decision Making/ A&P                           Medical Decision Making Amount and/or Complexity of Data Reviewed Labs: ordered. Radiology: ordered.  Risk Prescription drug management.   Jos Mckechnie is a 41 year old male with a history of CHF and OSA presents with several weeks of worsening dyspnea and new onset chest pain starting this morning.  Not adherent to medicine at home. CPAP broken.  Euvolemic on exam.  Labs notable for troponin of 98 and potassium of 2.9. BNP within normal limits. CXR without pulmonary edema, pleural effusions. EKG shows no STEMI. Differential includes acute HF exacerbation, severe symptomatic hypertension. Less likely ACS given lack of significant stenosis on catheterization in February.   Co morbidities that complicate the patient evaluation  OSA Hypertension  Social Determinants of Health:  Nonadherent to medication regimen  Additional history obtained:  Additional history and/or information obtained from chart review External records from outside source obtained and reviewed including charts from prior encounters.  Lab Tests:  I  Ordered (or co-signed), and personally interpreted labs.  The pertinent results include:   Troponin 98 Potassium 2.9 BNP 69.1  Imaging Studies ordered:  I ordered (or co-signed) imaging studies including CXR  I independently visualized and interpreted imaging which showed no pulmonary edema or pleural effusions I agree with the radiologist interpretation  Cardiac Monitoring:  The patient was maintained on a cardiac monitor.  The cardiac monitored showed an rhythm of NSR. The patient was also maintained on pulse oximetry. The readings were typically within normal limits.  Medicines ordered and prescription drug management:  I ordered medication including spironolactone 25 mg, furosemide 20 mg, losartan 100 mg. Reevaluation of the patient after these medicines showed that the patient stayed the same  Consultations Obtained:  I requested consultation with cardiology,  and discussed lab and imaging findings as well as pertinent plan - they recommend: blood pressure management   Reevaluation:  After the interventions noted above, I reevaluated the patient and found that they have :stayed the same.    Dispostion:  After consideration of the diagnostic results and the patients response to treatment, I feel that the patent would benefit from admission for evaluation and workup of chest pain and dyspnea in the setting of CHF, OSA, and hypertension.          Final Clinical Impression(s) / ED Diagnoses Final diagnoses:  Chest pain, unspecified type  Dyspnea on exertion  Hypertension, unspecified type    Rx / DC Orders ED Discharge Orders     None         Nani Gasser, MD 10/20/21 2218    Lajean Saver, MD 10/20/21 2308

## 2021-10-20 NOTE — ED Provider Triage Note (Signed)
Emergency Medicine Provider Triage Evaluation Note  Jesse Morris , a 41 y.o. male  was evaluated in triage.  Pt complains of shortness of breath.  Patient states he has baseline shortness of breath after a diagnosis of CHF earlier this year.  He reports worsening sleep apnea after his CPAP machine broke at home.  He notes now apneic episodes while awake when he is resting.  He reported 1 episode of sharp chest pain is left-sided in nature with radiation to his left arm earlier today when he tried with some rest.  For the past 4 days, he is felt increasingly short of breath at rest as well as with movement.  Denies fever, chills, night sweats, abdominal pain, nausea, vomiting.  Review of Systems  Positive: See above Negative:   Physical Exam  BP (!) 157/117 (BP Location: Left Arm)   Pulse 92   Temp 98.8 F (37.1 C) (Oral)   Resp 18   Ht 6' (1.829 m)   Wt 136.1 kg   SpO2 99%   BMI 40.69 kg/m  Gen:   Awake, no distress   Resp:  Normal effort  MSK:   Moves extremities without difficulty  Other:    Medical Decision Making  Medically screening exam initiated at 5:31 PM.  Appropriate orders placed.  Jesse Morris was informed that the remainder of the evaluation will be completed by another provider, this initial triage assessment does not replace that evaluation, and the importance of remaining in the ED until their evaluation is complete.     Jesse Morris, [place] 10/20/21 1735

## 2021-10-20 NOTE — ED Notes (Signed)
Pt ambulatory to bathroom w/o assistance.

## 2021-10-20 NOTE — ED Triage Notes (Signed)
Patient reports that he has a history of heart failure. Patient states he feels like he is gasping for air and trying to breathe underwater. Sats-100% in triage. Patient c/o left chest pain that radiates into the left arm. Patient states that he has frequent chest pain, but worse today.

## 2021-10-21 ENCOUNTER — Observation Stay (HOSPITAL_COMMUNITY): Payer: Medicaid Other

## 2021-10-21 ENCOUNTER — Other Ambulatory Visit (HOSPITAL_COMMUNITY): Payer: Self-pay

## 2021-10-21 DIAGNOSIS — Z789 Other specified health status: Secondary | ICD-10-CM

## 2021-10-21 DIAGNOSIS — I161 Hypertensive emergency: Secondary | ICD-10-CM

## 2021-10-21 DIAGNOSIS — R0602 Shortness of breath: Secondary | ICD-10-CM

## 2021-10-21 DIAGNOSIS — I2510 Atherosclerotic heart disease of native coronary artery without angina pectoris: Secondary | ICD-10-CM | POA: Diagnosis not present

## 2021-10-21 DIAGNOSIS — R778 Other specified abnormalities of plasma proteins: Secondary | ICD-10-CM | POA: Diagnosis not present

## 2021-10-21 DIAGNOSIS — I420 Dilated cardiomyopathy: Secondary | ICD-10-CM

## 2021-10-21 DIAGNOSIS — E782 Mixed hyperlipidemia: Secondary | ICD-10-CM

## 2021-10-21 DIAGNOSIS — E876 Hypokalemia: Secondary | ICD-10-CM

## 2021-10-21 DIAGNOSIS — G4733 Obstructive sleep apnea (adult) (pediatric): Secondary | ICD-10-CM

## 2021-10-21 DIAGNOSIS — F1290 Cannabis use, unspecified, uncomplicated: Secondary | ICD-10-CM | POA: Diagnosis present

## 2021-10-21 DIAGNOSIS — Z9989 Dependence on other enabling machines and devices: Secondary | ICD-10-CM

## 2021-10-21 LAB — CBC WITH DIFFERENTIAL/PLATELET
Abs Immature Granulocytes: 0.02 10*3/uL (ref 0.00–0.07)
Basophils Absolute: 0.1 10*3/uL (ref 0.0–0.1)
Basophils Relative: 1 %
Eosinophils Absolute: 0.3 10*3/uL (ref 0.0–0.5)
Eosinophils Relative: 4 %
HCT: 39.8 % (ref 39.0–52.0)
Hemoglobin: 12.4 g/dL — ABNORMAL LOW (ref 13.0–17.0)
Immature Granulocytes: 0 %
Lymphocytes Relative: 31 %
Lymphs Abs: 1.9 10*3/uL (ref 0.7–4.0)
MCH: 27.3 pg (ref 26.0–34.0)
MCHC: 31.2 g/dL (ref 30.0–36.0)
MCV: 87.5 fL (ref 80.0–100.0)
Monocytes Absolute: 0.5 10*3/uL (ref 0.1–1.0)
Monocytes Relative: 9 %
Neutro Abs: 3.4 10*3/uL (ref 1.7–7.7)
Neutrophils Relative %: 55 %
Platelets: 267 10*3/uL (ref 150–400)
RBC: 4.55 MIL/uL (ref 4.22–5.81)
RDW: 18.9 % — ABNORMAL HIGH (ref 11.5–15.5)
WBC: 6.2 10*3/uL (ref 4.0–10.5)
nRBC: 0 % (ref 0.0–0.2)

## 2021-10-21 LAB — BLOOD GAS, ARTERIAL
Acid-Base Excess: 9.4 mmol/L — ABNORMAL HIGH (ref 0.0–2.0)
Bicarbonate: 34.3 mmol/L — ABNORMAL HIGH (ref 20.0–28.0)
Drawn by: 308601
FIO2: 21 %
O2 Saturation: 94.4 %
Patient temperature: 37
pCO2 arterial: 46 mmHg (ref 32–48)
pH, Arterial: 7.48 — ABNORMAL HIGH (ref 7.35–7.45)
pO2, Arterial: 67 mmHg — ABNORMAL LOW (ref 83–108)

## 2021-10-21 LAB — TROPONIN I (HIGH SENSITIVITY): Troponin I (High Sensitivity): 103 ng/L (ref <18)

## 2021-10-21 LAB — COMPREHENSIVE METABOLIC PANEL
ALT: 35 U/L (ref 0–44)
AST: 30 U/L (ref 15–41)
Albumin: 4 g/dL (ref 3.5–5.0)
Alkaline Phosphatase: 31 U/L — ABNORMAL LOW (ref 38–126)
Anion gap: 8 (ref 5–15)
BUN: 13 mg/dL (ref 6–20)
CO2: 29 mmol/L (ref 22–32)
Calcium: 8.8 mg/dL — ABNORMAL LOW (ref 8.9–10.3)
Chloride: 103 mmol/L (ref 98–111)
Creatinine, Ser: 1.11 mg/dL (ref 0.61–1.24)
GFR, Estimated: >60 mL/min (ref >60)
Glucose, Bld: 104 mg/dL — ABNORMAL HIGH (ref 70–99)
Potassium: 3.2 mmol/L — ABNORMAL LOW (ref 3.5–5.1)
Sodium: 140 mmol/L (ref 135–145)
Total Bilirubin: 0.6 mg/dL (ref 0.3–1.2)
Total Protein: 7.2 g/dL (ref 6.5–8.1)

## 2021-10-21 LAB — ECHOCARDIOGRAM COMPLETE
Area-P 1/2: 2.69 cm2
Calc EF: 48.9 %
Height: 72 in
S' Lateral: 4.2 cm
Single Plane A2C EF: 42 %
Single Plane A4C EF: 52.9 %
Weight: 4800 oz

## 2021-10-21 LAB — MAGNESIUM: Magnesium: 1.8 mg/dL (ref 1.7–2.4)

## 2021-10-21 LAB — D-DIMER, QUANTITATIVE: D-Dimer, Quant: <0.27 ug/mL-FEU (ref 0.00–0.50)

## 2021-10-21 MED ORDER — ADULT MULTIVITAMIN W/MINERALS CH
1.0000 | ORAL_TABLET | Freq: Every day | ORAL | Status: DC
  Administered 2021-10-21: 1 via ORAL
  Filled 2021-10-21: qty 1

## 2021-10-21 MED ORDER — ACETAMINOPHEN 325 MG PO TABS: 650.0000 mg | ORAL_TABLET | Freq: Four times a day (QID) | ORAL | Status: DC | PRN

## 2021-10-21 MED ORDER — HYDRALAZINE HCL 20 MG/ML IJ SOLN
10.0000 mg | Freq: Four times a day (QID) | INTRAMUSCULAR | Status: DC | PRN
  Administered 2021-10-21: 10 mg via INTRAVENOUS
  Filled 2021-10-21: qty 1

## 2021-10-21 MED ORDER — LOSARTAN POTASSIUM 50 MG PO TABS
100.0000 mg | ORAL_TABLET | Freq: Every day | ORAL | Status: DC
  Administered 2021-10-21: 100 mg via ORAL
  Filled 2021-10-21: qty 2

## 2021-10-21 MED ORDER — POTASSIUM CHLORIDE CRYS ER 20 MEQ PO TBCR
40.0000 meq | EXTENDED_RELEASE_TABLET | Freq: Once | ORAL | Status: AC
Start: 2021-10-21 — End: 2021-10-21
  Administered 2021-10-21: 40 meq via ORAL
  Filled 2021-10-21: qty 2

## 2021-10-21 MED ORDER — ALBUTEROL SULFATE (2.5 MG/3ML) 0.083% IN NEBU: 2.5000 mg | INHALATION_SOLUTION | RESPIRATORY_TRACT | Status: DC | PRN

## 2021-10-21 MED ORDER — LOSARTAN POTASSIUM 100 MG PO TABS: 100.0000 mg | ORAL_TABLET | Freq: Every day | ORAL | 1 refills | Status: DC

## 2021-10-21 MED ORDER — METOPROLOL SUCCINATE ER 50 MG PO TB24
50.0000 mg | ORAL_TABLET | Freq: Every day | ORAL | 1 refills | Status: DC
  Filled 2021-10-21: qty 30, 30d supply, fill #0

## 2021-10-21 MED ORDER — THIAMINE HCL 100 MG PO TABS
100.0000 mg | ORAL_TABLET | Freq: Every day | ORAL | Status: DC
  Administered 2021-10-21: 100 mg via ORAL
  Filled 2021-10-21: qty 1

## 2021-10-21 MED ORDER — ATORVASTATIN CALCIUM 40 MG PO TABS: 40.0000 mg | ORAL_TABLET | Freq: Every day | ORAL | 1 refills | Status: DC

## 2021-10-21 MED ORDER — PERFLUTREN LIPID MICROSPHERE
1.0000 mL | INTRAVENOUS | Status: AC | PRN
  Administered 2021-10-21: 4 mL via INTRAVENOUS

## 2021-10-21 MED ORDER — LORAZEPAM 1 MG PO TABS: 1.0000 mg | ORAL_TABLET | ORAL | Status: DC | PRN

## 2021-10-21 MED ORDER — ENOXAPARIN SODIUM 40 MG/0.4ML IJ SOSY
40.0000 mg | PREFILLED_SYRINGE | INTRAMUSCULAR | Status: DC
  Administered 2021-10-21: 40 mg via SUBCUTANEOUS
  Filled 2021-10-21: qty 0.4

## 2021-10-21 MED ORDER — METOPROLOL SUCCINATE ER 50 MG PO TB24: 50.0000 mg | ORAL_TABLET | Freq: Every day | ORAL | 1 refills | Status: DC

## 2021-10-21 MED ORDER — LORAZEPAM 2 MG/ML IJ SOLN
1.0000 mg | INTRAMUSCULAR | Status: DC | PRN
  Administered 2021-10-21: 1 mg via INTRAVENOUS
  Filled 2021-10-21: qty 1

## 2021-10-21 MED ORDER — ATORVASTATIN CALCIUM 40 MG PO TABS
40.0000 mg | ORAL_TABLET | Freq: Every day | ORAL | 1 refills | Status: DC
  Filled 2021-10-21: qty 30, 30d supply, fill #0

## 2021-10-21 MED ORDER — POLYETHYLENE GLYCOL 3350 17 G PO PACK: 17.0000 g | PACK | Freq: Every day | ORAL | Status: DC | PRN

## 2021-10-21 MED ORDER — THIAMINE HCL 100 MG/ML IJ SOLN
100.0000 mg | Freq: Every day | INTRAMUSCULAR | Status: DC
Start: 2021-10-21 — End: 2021-10-21

## 2021-10-21 MED ORDER — FOLIC ACID 1 MG PO TABS
1.0000 mg | ORAL_TABLET | Freq: Every day | ORAL | Status: DC
  Administered 2021-10-21: 1 mg via ORAL
  Filled 2021-10-21: qty 1

## 2021-10-21 MED ORDER — NITROGLYCERIN 0.4 MG SL SUBL: 0.4000 mg | SUBLINGUAL_TABLET | SUBLINGUAL | Status: DC | PRN

## 2021-10-21 MED ORDER — FUROSEMIDE 10 MG/ML IJ SOLN
20.0000 mg | Freq: Once | INTRAMUSCULAR | Status: AC
  Administered 2021-10-21: 20 mg via INTRAVENOUS
  Filled 2021-10-21: qty 2

## 2021-10-21 MED ORDER — LOSARTAN POTASSIUM 100 MG PO TABS
100.0000 mg | ORAL_TABLET | Freq: Every day | ORAL | 1 refills | Status: DC
  Filled 2021-10-21: qty 30, 30d supply, fill #0

## 2021-10-21 MED ORDER — ONDANSETRON HCL 4 MG/2ML IJ SOLN: 4.0000 mg | Freq: Four times a day (QID) | INTRAMUSCULAR | Status: DC | PRN

## 2021-10-21 MED ORDER — ONDANSETRON HCL 4 MG PO TABS: 4.0000 mg | ORAL_TABLET | Freq: Four times a day (QID) | ORAL | Status: DC | PRN

## 2021-10-21 MED ORDER — ACETAMINOPHEN 650 MG RE SUPP: 650.0000 mg | Freq: Four times a day (QID) | RECTAL | Status: DC | PRN

## 2021-10-21 MED ORDER — ATORVASTATIN CALCIUM 40 MG PO TABS
40.0000 mg | ORAL_TABLET | Freq: Every day | ORAL | Status: DC
  Administered 2021-10-21: 40 mg via ORAL
  Filled 2021-10-21: qty 1

## 2021-10-21 MED ORDER — ASPIRIN 81 MG PO TBEC
81.0000 mg | DELAYED_RELEASE_TABLET | Freq: Every day | ORAL | Status: DC
  Administered 2021-10-21: 81 mg via ORAL
  Filled 2021-10-21: qty 1

## 2021-10-21 NOTE — Assessment & Plan Note (Signed)
·   Replacing with potassium chloride °· Evaluating for concurrent hypomagnesemia  °· Monitoring potassium levels with serial chemistries. ° °

## 2021-10-21 NOTE — H&P (Addendum)
History and Physical    Patient: Jesse Morris MRN: [id number] DOA: 10/20/2021  Date of Service: the patient was seen and examined on 10/21/2021  Patient coming from: Home  Chief Complaint:  Chief Complaint  Patient presents with   Chest Pain   Shortness of Breath    HPI:   41 year old male with past medical history obstructive sleep apnea on CPAP, hypertension, hyperlipidemia, obesity, mild pulmonary hypertension (mildly elelvated LVEDP via cath 04/2021), coronary artery disease (Cath 04/2021 wtih very mild 15% lesions of the mid LAD and dist LAD), and dilated cardiomyopathy (Echo 04/2021 EF 45-50% with global hypokinesis, mildly dilated LV) who presents to [facility] emergency department complaining of shortness of breath.  Patient explains that for the last few weeks he has been experiencing increasing shortness of breath.  Shortness of breath is severe in intensity, worse with exertion and improved with rest.  Shortness of breath associated with paroxysmal nocturnal dyspnea, pillow orthopnea and intermittent chest discomfort.  When asked to describe his chest discomfort he describes it as midsternal in location, sharp in quality and radiating down the left arm.  Patient states that the discomfort seems to worsen with exertion.  Patient reports that since his hospitalization and 04/2021 diagnosed with mild coronary artery disease and mild dilated cardiomyopathy he has not been taking any of his medications nor has he followed up with his primary care provider or his cardiologist.  Patient symptoms have continued to persist to the point where he has presented to [facility] emergency department for evaluation.  Upon evaluation in the emergency department patient was actively short of breath and found to have markedly elevated blood pressures as high as 198/147.  Patient was placed on oral losartan and given intravenous labetalol.  EDP discussed case with cardiology who did  not believe that the patient was suffering from ACS considering relatively normal coronaries in February.  Due to concerns for hypertensive urgency/emergency the hospitalist group is now been called to assess the patient for admission to the hospital.   Review of Systems: Review of Systems  Respiratory:  Positive for cough and shortness of breath.   Cardiovascular:  Positive for chest pain, orthopnea and PND.  All other systems reviewed and are negative.    Past Medical History:  Diagnosis Date   CHF (congestive heart failure) (HCC)    Hypertension    Obesity    Sleep apnea     Past Surgical History:  Procedure Laterality Date   HAND TENDON SURGERY     RIGHT/LEFT HEART CATH AND CORONARY ANGIOGRAPHY N/A 04/17/2021   Procedure: RIGHT/LEFT HEART CATH AND CORONARY ANGIOGRAPHY;  Surgeon: Laurey Morale, MD;  Location: [facility] INVASIVE CV LAB;  Service: Cardiovascular;  Laterality: N/A;    Social History:  reports that he has been smoking cigarettes. He has never used smokeless tobacco. He reports current alcohol use. He reports current drug use. Drug: Marijuana.  No Known Allergies  History reviewed. No pertinent family history.  Prior to Admission medications   Medication Sig Start Date End Date Taking? Authorizing Provider  albuterol (PROVENTIL HFA;VENTOLIN HFA) 108 (90 Base) MCG/ACT inhaler Inhale 1 puff into the lungs every 6 (six) hours as needed for wheezing or shortness of breath. This was his mothers inhaler. Pt only used this once.   Yes [provider]  losartan (COZAAR) 100 MG tablet Take 1 tablet (100 mg total) by mouth daily. 04/19/21  Yes Haydee Monica, MD  furosemide (LASIX) 20 MG tablet  Take 1 tablet (20 mg total) by mouth daily. Patient not taking: Reported on 10/20/2021 04/20/21   Haydee Monica, MD  spironolactone (ALDACTONE) 25 MG tablet Take 1 tablet (25 mg total) by mouth daily. Patient not taking: Reported on 10/20/2021 04/20/21   Haydee Monica, MD  vitamin  B-12 1000 MCG tablet Take 1 tablet (1,000 mcg total) by mouth daily. Patient not taking: Reported on 10/20/2021 04/20/21   Haydee Monica, MD    Physical Exam:  Vitals:   10/20/21 2333 10/21/21 0007 10/21/21 0104 10/21/21 0107  BP:  (!) 174/118  (!) 175/117  Pulse:  82 80 80  Resp:  18 (!) 22 (!) 24  Temp: 98.6 F (37 C) 99.1 F (37.3 C)    TempSrc: Oral Oral    SpO2:  98%    Weight:      Height:        Constitutional: Awake alert and oriented x3, patient is in mild respiratory distress.  Patient is obese.   Skin: no rashes, no lesions, good skin turgor noted. Eyes: Pupils are equally reactive to light.  No evidence of scleral icterus or conjunctival pallor.  ENMT: Moist mucous membranes noted.  Posterior pharynx clear of any exudate or lesions.   Neck: normal, supple, no masses, no thyromegaly.  No evidence of jugular venous distension.   Respiratory: clear to auscultation bilaterally, no wheezing, no crackles. Normal respiratory effort. No accessory muscle use.  Cardiovascular: Tachycardia rate with regular rhythm no murmurs / rubs / gallops. No extremity edema. 2+ pedal pulses. No carotid bruits.  Chest:   Nontender without crepitus or deformity.   Back:   Nontender without crepitus or deformity. Abdomen: Abdomen is protuberant but soft and nontender.  No evidence of intra-abdominal masses.  Positive bowel sounds noted in all quadrants.   Musculoskeletal: No joint deformity upper and lower extremities. Good ROM, no contractures. Normal muscle tone.  Neurologic: CN 2-12 grossly intact. Sensation intact.  Patient moving all 4 extremities spontaneously.  Patient is following all commands.  Patient is responsive to verbal stimuli.   Psychiatric: Patient exhibits anxious mood with appropriate affect.  Patient seems to possess insight as to their current situation.    Data Reviewed:  I have personally reviewed and interpreted labs, imaging.  Significant findings are:  Chemistry  revealing sodium 138, potassium 2.9, chloride 102, bicarbonate 25, BUN 14, creatinine 0.97 CBC revealing white blood cell count of 5.2, hemoglobin 12.7, hematocrit 40, platelet count 273 First troponin 103, second troponin 98 Magnesium 1.8 Urine toxicology screen positive for THC CXR:  Chest X-ray was personally reviewed.  No evidence of focal infiltrates.  No evidence of pleural effusion.  No evidence of pneumothorax.    EKG: Personally reviewed.  Rhythm is normal sinus rhythm with heart rate of 79 bpm.  No dynamic ST segment changes appreciated.   Assessment and Plan: * Hypertensive emergency Patient presenting with shortness of breath and chest discomfort with elevated troponin in the setting of markedly elevated blood pressures and medication noncompliance EDP discussed associated chest pain and slightly elevated troponins with cardiology.  They did not believe that plaque rupture/ACS was likely considering minimal CAD on cardiac catheterization 04/2021. Concern for hypertensive urgency and possible emergency considering elevated troponin Placing patient back on schedule daily losartan As needed intravenous hydralazine for markedly elevated blood pressure Close monitoring in the progressive unit Serial cardiac enzymes Echocardiogram in the morning Monitoring patient on telemetry Day team to consider formal cardiology consultation in  the morning  Elevated troponin level not due myocardial infarction Slightly elevated serial troponins  Possibly secondary to markedly elevated blood pressure/hypertensive emergency Patient had chest discomfort earlier today but this seems to be spontaneously resolving plaque rupture is unlikely No evidence of dynamic ST segment change on EKG Monitoring patient on telemetry We will consider cardiology consultation in the morning if chest pain persists   Shortness of breath Patient complaining of several day history of shortness of breath, pillow  orthopnea and paroxysmal nocturnal dyspnea That being said, BNP is unremarkable and there is no clinical evidence of volume overload on physical exam I believe that shortness of breath is also secondary to his markedly elevated blood pressures Attempting to manage hypertensive urgency/emergency as noted above Obtaining ABG, D-dimer We will give a trial dose of intravenous Lasix as well Echocardiogram in the morning to determine if there is any deterioration in cardiac function since early in the year  Hypokalemia Replacing with potassium chloride Evaluating for concurrent hypomagnesemia  Monitoring potassium levels with serial chemistries.   Coronary artery disease involving native coronary artery of native heart Very mild coronary artery disease identified on cardiac catheterization 04/2021 While patient did have chest discomfort earlier in the day patient is currently chest pain-free upon arrival to the medical floor Monitoring patient on telemetry Continue home regimen of resuming daily baby aspirin therapy  Obtaining lipid panel and will place patient on statin therapy if LDL is greater than 70  As needed nitroglycerin for bouts of chest discomfort     OSA on CPAP Continuing home regimen of CPAP nightly   Mixed hyperlipidemia Patient reports noncompliance to medical therapy We will resume statin therapy with atorvastatin 40 mg daily Lipid panel in the morning  Alcohol use Patient reports upwards of 4 beers several times weekly We will place patient on CIWA protocol, as needed benzodiazepines to be administered if there is any evidence of withdrawal  Marijuana use Counseling on cessation       Code Status:  Full code  code status decision has been confirmed with: patient Family Communication: deferred   Consults: Day team to consider consulting cardiology in AM.  Severity of Illness:  The appropriate patient status for this patient is OBSERVATION. Observation  status is judged to be reasonable and necessary in order to provide the required intensity of service to ensure the patient's safety. The patient's presenting symptoms, physical exam findings, and initial radiographic and laboratory data in the context of their medical condition is felt to place them at decreased risk for further clinical deterioration. Furthermore, it is anticipated that the patient will be medically stable for discharge from the hospital within 2 midnights of admission.   Author:  Marinda Elk MD  10/21/2021 1:38 AM

## 2021-10-21 NOTE — Progress Notes (Signed)
  Echocardiogram 2D Echocardiogram has been performed.  Augustine Radar 10/21/2021, 8:40 AM

## 2021-10-21 NOTE — Assessment & Plan Note (Signed)
   Slightly elevated serial troponins   Possibly secondary to markedly elevated blood pressure/hypertensive emergency  Patient had chest discomfort earlier today but this seems to be spontaneously resolving  plaque rupture is unlikely  No evidence of dynamic ST segment change on EKG  Monitoring patient on telemetry  We will consider cardiology consultation in the morning if chest pain persists

## 2021-10-21 NOTE — Assessment & Plan Note (Addendum)
   Patient presenting with shortness of breath and chest discomfort with elevated troponin in the setting of markedly elevated blood pressures and medication noncompliance  EDP discussed associated chest pain and slightly elevated troponins with cardiology.  They did not believe that plaque rupture/ACS was likely considering minimal CAD on cardiac catheterization 04/2021.  Concern for hypertensive urgency and possible emergency considering elevated troponin  Placing patient back on schedule daily losartan  As needed intravenous hydralazine for markedly elevated blood pressure  Close monitoring in the progressive unit  Serial cardiac enzymes  Echocardiogram in the morning  Monitoring patient on telemetry  Day team to consider formal cardiology consultation in the morning

## 2021-10-21 NOTE — Assessment & Plan Note (Signed)
.   Very mild coronary artery disease identified on cardiac catheterization 04/2021 . While patient did have chest discomfort earlier in the day patient is currently chest pain-free upon arrival to the medical floor . Monitoring patient on telemetry . Continue home regimen of resuming daily baby aspirin therapy  . Obtaining lipid panel and will place patient on statin therapy if LDL is greater than 70  . As needed nitroglycerin for bouts of chest discomfort

## 2021-10-21 NOTE — Assessment & Plan Note (Signed)
   Patient reports noncompliance to medical therapy  We will resume statin therapy with atorvastatin 40 mg daily  Lipid panel in the morning

## 2021-10-21 NOTE — TOC Transition Note (Signed)
Transition of Care [facility]) - CM/SW Discharge Note   Patient Details  Name: Jesse Morris MRN: [id number] Date of Birth: 08-03-1980  Transition of Care [facility]) CM/SW Contact:  Larrie Kass, LCSW Phone Number: 10/21/2021, 2:59 PM   Clinical Narrative:     CSW  spoke with pt to discuss d/c planning, pt stated he lost his insurance when switching jobs, pt stated he is needing assistance with obtaining his medications. CSW present pt with an [facility] letter and explained the program.  TOC sign off.    Patient Goals and CMS Choice Patient states their goals for this hospitalization and ongoing recovery are:: return home      Discharge Placement                       Discharge Plan and Services   Discharge Planning Services: [facility] Program                                 Social Determinants of Health (SDOH) Interventions     Readmission Risk Interventions     No data to display

## 2021-10-21 NOTE — Discharge Summary (Signed)
Physician Discharge Summary  Jesse Morris FHL:[id number] DOB: 03-06-1981 DOA: 10/20/2021  PCP: Norm Salt, PA  Admit date: 10/20/2021 Discharge date: 10/21/2021  Admitted From: Home Disposition:  Home  Recommendations for Outpatient Follow-up:  Follow up with PCP in 1 week Follow up with Dr. Sharyn Lull Cardiology in 1 week   Discharge Condition: Stable CODE STATUS: Full code Diet recommendation: Heart healthy diet  Brief/Interim Summary: From H&P by Dr. Leafy Half: "41 year old male with past medical history obstructive sleep apnea on CPAP, hypertension, hyperlipidemia, obesity, mild pulmonary hypertension (mildly elelvated LVEDP via cath 04/2021), coronary artery disease (Cath 04/2021 wtih very mild 15% lesions of the mid LAD and dist LAD), and dilated cardiomyopathy (Echo 04/2021 EF 45-50% with global hypokinesis, mildly dilated LV) who presents to [facility] emergency department complaining of shortness of breath.   Patient explains that for the last few weeks he has been experiencing increasing shortness of breath.  Shortness of breath is severe in intensity, worse with exertion and improved with rest.  Shortness of breath associated with paroxysmal nocturnal dyspnea, pillow orthopnea and intermittent chest discomfort.   When asked to describe his chest discomfort he describes it as midsternal in location, sharp in quality and radiating down the left arm.  Patient states that the discomfort seems to worsen with exertion.   Patient reports that since his hospitalization and 04/2021 diagnosed with mild coronary artery disease and mild dilated cardiomyopathy he has not been taking any of his medications nor has he followed up with his primary care provider or his cardiologist.  Patient symptoms have continued to persist to the point where he has presented to [facility] emergency department for evaluation.   Upon evaluation in the emergency department patient was actively  short of breath and found to have markedly elevated blood pressures as high as 198/147.  Patient was placed on oral losartan and given intravenous labetalol.  EDP discussed case with cardiology who did not believe that the patient was suffering from ACS considering relatively normal coronaries in February.  Due to concerns for hypertensive urgency/emergency the hospitalist group is now been called to assess the patient for admission to the hospital."  Cardiology was consulted.  Patient had repeat echocardiogram completed.  Medications were adjusted for his hypertensive emergency.  Blood pressure improved.  Troponin levels remained flat, without sign of ACS.  Patient was encouraged to have close follow-up outpatient, adhere to medication prescriptions, wear CPAP at nighttime.  Encourage patient to reach out to medical supply company for strap replacement for his CPAP mask.  Discharge Diagnoses:   Principal Problem:   Hypertensive emergency Active Problems:   Elevated troponin level not due myocardial infarction   Shortness of breath   Hypokalemia   Coronary artery disease involving native coronary artery of native heart   OSA on CPAP   Mixed hyperlipidemia   Alcohol use   Marijuana use   Obesity, Class III, BMI 40-49.9 (morbid obesity) (HCC)   Cardiomyopathy, dilated, nonischemic [facility])   Discharge Instructions  Discharge Instructions     (HEART FAILURE PATIENTS) Call MD:  Anytime you have any of the following symptoms: 1) 3 pound weight gain in 24 hours or 5 pounds in 1 week 2) shortness of breath, with or without a dry hacking cough 3) swelling in the hands, feet or stomach 4) if you have to sleep on extra pillows at night in order to breathe.   Complete by: As directed    Call MD for:  difficulty  breathing, headache or visual disturbances   Complete by: As directed    Call MD for:  extreme fatigue   Complete by: As directed    Call MD for:  persistant dizziness or light-headedness    Complete by: As directed    Call MD for:  persistant nausea and vomiting   Complete by: As directed    Call MD for:  severe uncontrolled pain   Complete by: As directed    Call MD for:  temperature >100.4   Complete by: As directed    Diet - low sodium heart healthy   Complete by: As directed    Discharge instructions   Complete by: As directed    You were cared for by a hospitalist during your hospital stay. If you have any questions about your discharge medications or the care you received while you were in the hospital after you are discharged, you can call the unit and ask to speak with the hospitalist on call if the hospitalist that took care of you is not available. Once you are discharged, your primary care physician will handle any further medical issues. Please note that NO REFILLS for any discharge medications will be authorized once you are discharged, as it is imperative that you return to your primary care physician (or establish a relationship with a primary care physician if you do not have one) for your aftercare needs so that they can reassess your need for medications and monitor your lab values.   Increase activity slowly   Complete by: As directed       Allergies as of 10/21/2021   No Known Allergies      Medication List     STOP taking these medications    furosemide 20 MG tablet Commonly known as: LASIX   spironolactone 25 MG tablet Commonly known as: ALDACTONE       TAKE these medications    albuterol 108 (90 Base) MCG/ACT inhaler Commonly known as: VENTOLIN HFA Inhale 1 puff into the lungs every 6 (six) hours as needed for wheezing or shortness of breath. This was his mothers inhaler. Pt only used this once.   atorvastatin 40 MG tablet Commonly known as: LIPITOR Take 1 tablet (40 mg total) by mouth daily. Start taking on: October 22, 2021   losartan 100 MG tablet Commonly known as: COZAAR Take 1 tablet (100 mg total) by mouth daily.    metoprolol succinate 50 MG 24 hr tablet Commonly known as: Toprol XL Take 1 tablet (50 mg total) by mouth daily. Take with or immediately following a meal.        Follow-up Information     Norm Salt, PA Follow up.   Specialty: Physician Assistant Contact information: [address] [place] [address] [phone]         Rinaldo Cloud, MD. Schedule an appointment as soon as possible for a visit in 1 week(s).   Specialty: Cardiology Contact information: [address] [place] [address] [phone]                No Known Allergies  Consultations: Cardiology    Procedures/Studies: ECHOCARDIOGRAM COMPLETE  Result Date: 10/21/2021    ECHOCARDIOGRAM REPORT   Patient Name:   Jesse Morris Date of Exam: 10/21/2021 Medical Rec #:  [id number]    Height:       72.0 in Accession #:    [id number]   Weight:       300.0 lb  Date of Birth:  Dec 04, 1980    BSA:          2.531 m Patient Age:    40 years     BP:           152/97 mmHg Patient Gender: M            HR:           79 bpm. Exam Location:  Inpatient Procedure: 2D Echo, Cardiac Doppler, Color Doppler and Intracardiac            Opacification Agent Indications:    Dilated cardiomyopathy I42.0  History:        Patient has prior history of Echocardiogram examinations, most                 recent 04/16/2021. CHF; Risk Factors:Hypertension and Sleep Apnea.  Sonographer:    Eulah Pont RDCS Referring Phys: [id number] Marinda Elk  Sonographer Comments: Patient is obese. Image acquisition challenging due to respiratory motion. IMPRESSIONS  1. Left ventricular ejection fraction, by estimation, is 45 to 50%. The left ventricle has mildly decreased function. The left ventricle demonstrates global hypokinesis. There is mild concentric left ventricular hypertrophy. Left ventricular diastolic parameters are consistent with Grade I diastolic dysfunction (impaired relaxation).  2. Right ventricular systolic  function is normal. The right ventricular size is normal.  3. The mitral valve is normal in structure. No evidence of mitral valve regurgitation.  4. The aortic valve is normal in structure. Aortic valve regurgitation is not visualized.  5. Aortic dilatation noted. There is borderline dilatation of the ascending aorta.  6. The inferior vena cava is normal in size with <50% respiratory variability, suggesting right atrial pressure of 8 mmHg. FINDINGS  Left Ventricle: Left ventricular ejection fraction, by estimation, is 45 to 50%. The left ventricle has mildly decreased function. The left ventricle demonstrates global hypokinesis. Definity contrast agent was given IV to delineate the left ventricular  endocardial borders. The left ventricular internal cavity size was normal in size. There is mild concentric left ventricular hypertrophy. Left ventricular diastolic parameters are consistent with Grade I diastolic dysfunction (impaired relaxation). Right Ventricle: The right ventricular size is normal. No increase in right ventricular wall thickness. Right ventricular systolic function is normal. Left Atrium: Left atrial size was normal in size. Right Atrium: Right atrial size was normal in size. Pericardium: There is no evidence of pericardial effusion. Mitral Valve: The mitral valve is normal in structure. No evidence of mitral valve regurgitation. Tricuspid Valve: The tricuspid valve is normal in structure. Tricuspid valve regurgitation is trivial. Aortic Valve: The aortic valve is normal in structure. Aortic valve regurgitation is not visualized. Pulmonic Valve: The pulmonic valve was normal in structure. Pulmonic valve regurgitation is trivial. Aorta: Aortic dilatation noted. There is borderline dilatation of the ascending aorta. Venous: The inferior vena cava is normal in size with less than 50% respiratory variability, suggesting right atrial pressure of 8 mmHg. IAS/Shunts: No atrial level shunt detected by color  flow Doppler.  LEFT VENTRICLE PLAX 2D LVIDd:         5.20 cm      Diastology LVIDs:         4.20 cm      LV e' medial:    4.28 cm/s LV PW:         1.20 cm      LV E/e' medial:  12.5 LV IVS:        1.20 cm  LV e' lateral:   4.95 cm/s LVOT diam:     2.30 cm      LV E/e' lateral: 10.8 LV SV:         100 LV SV Index:   40 LVOT Area:     4.15 cm  LV Volumes (MOD) LV vol d, MOD A2C: 156.0 ml LV vol d, MOD A4C: 191.0 ml LV vol s, MOD A2C: 90.5 ml LV vol s, MOD A4C: 90.0 ml LV SV MOD A2C:     65.5 ml LV SV MOD A4C:     191.0 ml LV SV MOD BP:      86.4 ml RIGHT VENTRICLE RV S prime:     11.20 cm/s TAPSE (M-mode): 1.8 cm LEFT ATRIUM             Index        RIGHT ATRIUM           Index LA diam:        3.80 cm 1.50 cm/m   RA Area:     16.90 cm LA Vol (A2C):   82.2 ml 32.47 ml/m  RA Volume:   38.60 ml  15.25 ml/m LA Vol (A4C):   81.7 ml 32.28 ml/m LA Biplane Vol: 84.0 ml 33.19 ml/m  AORTIC VALVE LVOT Vmax:   121.00 cm/s LVOT Vmean:  78.000 cm/s LVOT VTI:    0.241 m  AORTA Ao Root diam: 4.10 cm Ao Asc diam:  4.10 cm MITRAL VALVE MV Area (PHT): 2.69 cm    SHUNTS MV Decel Time: 282 msec    Systemic VTI:  0.24 m MV E velocity: 53.60 cm/s  Systemic Diam: 2.30 cm MV A velocity: 74.60 cm/s MV E/A ratio:  0.72 Rinaldo Cloud MD Electronically signed by Rinaldo Cloud MD Signature Date/Time: 10/21/2021/9:41:26 AM    Final    DG Chest Port 1 View  Result Date: 10/20/2021 CLINICAL DATA:  Chest pain and shortness of breath EXAM: PORTABLE CHEST 1 VIEW COMPARISON:  Radiographs 04/13/2021 FINDINGS: No focal consolidation, pleural effusion, or pneumothorax. Normal cardiomediastinal silhouette. No acute osseous abnormality. IMPRESSION: No active disease. Electronically Signed   By: Minerva Fester M.D.   On: 10/20/2021 17:50       Discharge Exam: Vitals:   10/21/21 0537 10/21/21 1000  BP: (!) 152/97 (!) 161/86  Pulse: 66 86  Resp:  18  Temp: 98 F (36.7 C) 99 F (37.2 C)  SpO2: 97% 97%    General: Pt is alert,  awake, not in acute distress Cardiovascular: RRR, S1/S2 +, no edema Respiratory: CTA bilaterally, no wheezing, no rhonchi, no respiratory distress, no conversational dyspnea  Abdominal: Soft, NT, ND, bowel sounds + Extremities: no edema, no cyanosis Psych: Normal mood and affect, stable judgement and insight     The results of significant diagnostics from this hospitalization (including imaging, microbiology, ancillary and laboratory) are listed below for reference.     Microbiology: No results found for this or any previous visit (from the past 240 hour(s)).   Labs: BNP (last 3 results) Recent Labs    04/13/21 1858 10/20/21 1739  BNP 31.6 69.1   Basic Metabolic Panel: Recent Labs  Lab 10/20/21 1739 10/20/21 1824 10/21/21 0057  NA 138  --  140  K 2.9*  --  3.2*  CL 102  --  103  CO2 25  --  29  GLUCOSE 145*  --  104*  BUN 14  --  13  CREATININE 0.97  --  1.11  CALCIUM 8.9  --  8.8*  MG  --  1.8 1.8   Liver Function Tests: Recent Labs  Lab 10/21/21 0057  AST 30  ALT 35  ALKPHOS 31*  BILITOT 0.6  PROT 7.2  ALBUMIN 4.0   No results for input(s): "LIPASE", "AMYLASE" in the last 168 hours. No results for input(s): "AMMONIA" in the last 168 hours. CBC: Recent Labs  Lab 10/20/21 1739 10/21/21 0057  WBC 5.2 6.2  NEUTROABS  --  3.4  HGB 12.7* 12.4*  HCT 40.0 39.8  MCV 86.6 87.5  PLT 273 267   Cardiac Enzymes: No results for input(s): "CKTOTAL", "CKMB", "CKMBINDEX", "TROPONINI" in the last 168 hours. BNP: Invalid input(s): "POCBNP" CBG: No results for input(s): "GLUCAP" in the last 168 hours. D-Dimer Recent Labs    10/21/21 0057  DDIMER <0.27   Hgb A1c No results for input(s): "HGBA1C" in the last 72 hours. Lipid Profile No results for input(s): "CHOL", "HDL", "LDLCALC", "TRIG", "CHOLHDL", "LDLDIRECT" in the last 72 hours. Thyroid function studies No results for input(s): "TSH", "T4TOTAL", "T3FREE", "THYROIDAB" in the last 72 hours.  Invalid  input(s): "FREET3" Anemia work up No results for input(s): "VITAMINB12", "FOLATE", "FERRITIN", "TIBC", "IRON", "RETICCTPCT" in the last 72 hours. Urinalysis No results found for: "COLORURINE", "APPEARANCEUR", "LABSPEC", "PHURINE", "GLUCOSEU", "HGBUR", "BILIRUBINUR", "KETONESUR", "PROTEINUR", "UROBILINOGEN", "NITRITE", "LEUKOCYTESUR" Sepsis Labs Recent Labs  Lab 10/20/21 1739 10/21/21 0057  WBC 5.2 6.2   Microbiology No results found for this or any previous visit (from the past 240 hour(s)).   Patient was seen and examined on the day of discharge and was found to be in stable condition. Time coordinating discharge: 40 minutes including assessment and coordination of care, as well as examination of the patient.   SIGNED:  Noralee Stain, DO [facility] 10/21/2021, 1:22 PM

## 2021-10-21 NOTE — Assessment & Plan Note (Signed)
   Patient reports upwards of 4 beers several times weekly  We will place patient on CIWA protocol, as needed benzodiazepines to be administered if there is any evidence of withdrawal

## 2021-10-21 NOTE — Assessment & Plan Note (Signed)
.   Continuing home regimen of CPAP nightly  

## 2021-10-21 NOTE — Assessment & Plan Note (Signed)
Counseling on cessation 

## 2021-10-21 NOTE — Plan of Care (Signed)
  Problem: Activity: Goal: Risk for activity intolerance will decrease Outcome: Adequate for Discharge   Problem: Nutrition: Goal: Adequate nutrition will be maintained Outcome: Adequate for Discharge   Problem: Elimination: Goal: Will not experience complications related to bowel motility Outcome: Adequate for Discharge   Problem: Pain Managment: Goal: General experience of comfort will improve Outcome: Adequate for Discharge   

## 2021-10-21 NOTE — Assessment & Plan Note (Addendum)
   Patient complaining of several day history of shortness of breath, pillow orthopnea and paroxysmal nocturnal dyspnea  That being said, BNP is unremarkable and there is no clinical evidence of volume overload on physical exam  I believe that shortness of breath is also secondary to his markedly elevated blood pressures  Attempting to manage hypertensive urgency/emergency as noted above  Obtaining ABG, D-dimer  We will give a trial dose of intravenous Lasix as well  Echocardiogram in the morning to determine if there is any deterioration in cardiac function since early in the year

## 2021-10-21 NOTE — Consult Note (Signed)
Reason for Consult: Chest pain/hypertensive emergency/minimally elevated high-sensitivity troponin I Referring Physician: EDP  Jesse Morris is an 41 y.o. male.  HPI: Patient is a 41 year old Hispanic male with past medical history significant for nonischemic dilated cardiomyopathy, nonobstructive CAD, hypertension, morbid obesity, obstructive sleep apnea on CPAP, history of COVID-19 infection approximately 2 years ago, history of influenza approximately 6 months ago, came to the ED yesterday because of progressive worsening shortness of breath and chest pain sharp in nature off-and-on lasting few minutes for last few days.  Patient states he lost his job and insurance and stopped taking all his medications and was noted to have blood pressure of 198/147 and minimally elevated troponin high.  EDP called [facility] but stated that he is not in that practice anymore so called [place] for formal consultation.  Patient denies any exertional chest pain.  Presently denies any chest pain or shortness of breath blood pressure in 150s over 90s.  Patient denies any history of PND orthopnea or leg swelling states could not afford to get CPAP.  States she drinks alcohol few drinks occasionally.  Denies history of smoking.  Denies any cough fever or chills.  Past Medical History:  Diagnosis Date   CHF (congestive heart failure) ([place])    Hypertension    Obesity    Sleep apnea     Past Surgical History:  Procedure Laterality Date   HAND TENDON SURGERY     RIGHT/LEFT HEART CATH AND CORONARY ANGIOGRAPHY N/A 04/17/2021   Procedure: RIGHT/LEFT HEART CATH AND CORONARY ANGIOGRAPHY;  Surgeon: Larey Dresser, MD;  Location: [facility];  Service: Cardiovascular;  Laterality: N/A;    History reviewed. No pertinent family history.  Social History:  reports that he has been smoking cigarettes. He has never used smokeless tobacco. He reports current alcohol use. He reports current drug use. Drug: Marijuana.  Allergies:  No Known Allergies  Medications: I have reviewed the patient's current medications.  Results for orders placed or performed during the hospital encounter of 10/20/21 (from the past 48 hour(s))  Basic metabolic panel     Status: Abnormal   Collection Time: 10/20/21  5:39 PM  Result Value Ref Range   Sodium 138 135 - 145 mmol/L   Potassium 2.9 (L) 3.5 - 5.1 mmol/L   Chloride 102 98 - 111 mmol/L   CO2 25 22 - 32 mmol/L   Glucose, Bld 145 (H) 70 - 99 mg/dL    Comment: Glucose reference range applies only to samples taken after fasting for at least 8 hours.   BUN 14 6 - 20 mg/dL   Creatinine, Ser 0.97 0.61 - 1.24 mg/dL   Calcium 8.9 8.9 - 10.3 mg/dL   GFR, Estimated >60 >60 mL/min    Comment: (NOTE) Calculated using the CKD-EPI Creatinine Equation (2021)    Anion gap 11 5 - 15    Comment: Performed at [facility], [place] [address] [place], [place] [address]  CBC     Status: Abnormal   Collection Time: 10/20/21  5:39 PM  Result Value Ref Range   WBC 5.2 4.0 - 10.5 K/uL   RBC 4.62 4.22 - 5.81 MIL/uL   Hemoglobin 12.7 (L) 13.0 - 17.0 g/dL   HCT 40.0 39.0 - 52.0 %   MCV 86.6 80.0 - 100.0 fL   MCH 27.5 26.0 - 34.0 pg   MCHC 31.8 30.0 - 36.0 g/dL   RDW 18.9 (H) 11.5 - 15.5 %   Platelets 273 150 -  400 K/uL   nRBC 0.0 0.0 - 0.2 %    Comment: Performed at [facility], [place] [address] [place], [place] [address]  Troponin I (High Sensitivity)     Status: Abnormal   Collection Time: 10/20/21  5:39 PM  Result Value Ref Range   Troponin I (High Sensitivity) 98 (H) <18 ng/L    Comment: (NOTE) Elevated high sensitivity troponin I (hsTnI) values and significant  changes across serial measurements may suggest ACS but many other  chronic and acute conditions are known to elevate hsTnI results.  Refer to the "Links" section for chest pain algorithms and additional  guidance. Performed at [facility], [place] [address] [place],  [place] [address]   Brain natriuretic peptide     Status: None   Collection Time: 10/20/21  5:39 PM  Result Value Ref Range   B Natriuretic Peptide 69.1 0.0 - 100.0 pg/mL    Comment: Performed at [facility], [place] [address] [place], [place] [address]  Magnesium     Status: None   Collection Time: 10/20/21  6:24 PM  Result Value Ref Range   Magnesium 1.8 1.7 - 2.4 mg/dL    Comment: Performed at [facility], [place] [address] [place], [place] [address]  Rapid urine drug screen (hospital performed)     Status: Abnormal   Collection Time: 10/20/21  6:59 PM  Result Value Ref Range   Opiates NONE DETECTED NONE DETECTED   Cocaine NONE DETECTED NONE DETECTED   Benzodiazepines NONE DETECTED NONE DETECTED   Amphetamines NONE DETECTED NONE DETECTED   Tetrahydrocannabinol POSITIVE (A) NONE DETECTED   Barbiturates NONE DETECTED NONE DETECTED    Comment: (NOTE) DRUG SCREEN FOR MEDICAL PURPOSES ONLY.  IF CONFIRMATION IS NEEDED FOR ANY PURPOSE, NOTIFY LAB WITHIN 5 DAYS.  LOWEST DETECTABLE LIMITS FOR URINE DRUG SCREEN Drug Class                     Cutoff (ng/mL) Amphetamine and metabolites    1000 Barbiturate and metabolites    200 Benzodiazepine                 [id number] Tricyclics and metabolites     300 Opiates and metabolites        300 Cocaine and metabolites        300 THC                            50 Performed at [facility], [place] [address] [place], [place] [address]   Troponin I (High Sensitivity)     Status: Abnormal   Collection Time: 10/20/21  7:31 PM  Result Value Ref Range   Troponin I (High Sensitivity) 103 (HH) <18 ng/L    Comment: CRITICAL RESULT CALLED TO, READ BACK BY AND VERIFIED WITH KISER, C @ 2025 [phone] [phone] (NOTE) Elevated high sensitivity troponin I (hsTnI) values and significant  changes across serial measurements may suggest ACS but many other  chronic and acute conditions are known to elevate hsTnI results.   Refer to the "Links" section for chest pain algorithms and additional  guidance. Performed at [facility], [place] [address], [place], [place] [address]   Blood gas, arterial     Status: Abnormal   Collection Time: 10/21/21 12:50 AM  Result Value Ref Range   FIO2 21% %   Delivery systems ROOM AIR  pH, Arterial 7.48 (H) 7.35 - 7.45   pCO2 arterial 46 32 - 48 mmHg   pO2, Arterial 67 (L) 83 - 108 mmHg   Bicarbonate 34.3 (H) 20.0 - 28.0 mmol/L   Acid-Base Excess 9.4 (H) 0.0 - 2.0 mmol/L   O2 Saturation 94.4 %   Patient temperature 37.0    Collection site RIGHT RADIAL    Drawn by [id number]    Allens test (pass/fail) PASS PASS    Comment: Performed at [facility], [place] [address] [place], [place] [address]  Troponin I (High Sensitivity)     Status: Abnormal   Collection Time: 10/21/21 12:57 AM  Result Value Ref Range   Troponin I (High Sensitivity) 103 (HH) <18 ng/L    Comment: CRITICAL VALUE NOTED. VALUE IS CONSISTENT WITH PREVIOUSLY REPORTED/CALLED VALUE (NOTE) Elevated high sensitivity troponin I (hsTnI) values and significant  changes across serial measurements may suggest ACS but many other  chronic and acute conditions are known to elevate hsTnI results.  Refer to the "Links" section for chest pain algorithms and additional  guidance. Performed at [facility], [place] [address] [place], [place] [address]   Comprehensive metabolic panel     Status: Abnormal   Collection Time: 10/21/21 12:57 AM  Result Value Ref Range   Sodium 140 135 - 145 mmol/L   Potassium 3.2 (L) 3.5 - 5.1 mmol/L   Chloride 103 98 - 111 mmol/L   CO2 29 22 - 32 mmol/L   Glucose, Bld 104 (H) 70 - 99 mg/dL    Comment: Glucose reference range applies only to samples taken after fasting for at least 8 hours.   BUN 13 6 - 20 mg/dL   Creatinine, Ser 1.11 0.61 - 1.24 mg/dL   Calcium 8.8 (L) 8.9 - 10.3 mg/dL   Total Protein 7.2 6.5 - 8.1 g/dL   Albumin  4.0 3.5 - 5.0 g/dL   AST 30 15 - 41 U/L   ALT 35 0 - 44 U/L   Alkaline Phosphatase 31 (L) 38 - 126 U/L   Total Bilirubin 0.6 0.3 - 1.2 mg/dL   GFR, Estimated >60 >60 mL/min    Comment: (NOTE) Calculated using the CKD-EPI Creatinine Equation (2021)    Anion gap 8 5 - 15    Comment: Performed at [facility], [place] [address], [place], [place] [address]  Magnesium     Status: None   Collection Time: 10/21/21 12:57 AM  Result Value Ref Range   Magnesium 1.8 1.7 - 2.4 mg/dL    Comment: Performed at [facility], [place] [address], [place], [place] [address]  CBC with Differential/Platelet     Status: Abnormal   Collection Time: 10/21/21 12:57 AM  Result Value Ref Range   WBC 6.2 4.0 - 10.5 K/uL   RBC 4.55 4.22 - 5.81 MIL/uL   Hemoglobin 12.4 (L) 13.0 - 17.0 g/dL   HCT 39.8 39.0 - 52.0 %   MCV 87.5 80.0 - 100.0 fL   MCH 27.3 26.0 - 34.0 pg   MCHC 31.2 30.0 - 36.0 g/dL   RDW 18.9 (H) 11.5 - 15.5 %   Platelets 267 150 - 400 K/uL   nRBC 0.0 0.0 - 0.2 %   Neutrophils Relative % 55 %   Neutro Abs 3.4 1.7 - 7.7 K/uL   Lymphocytes Relative 31 %   Lymphs Abs 1.9 0.7 - 4.0 K/uL   Monocytes Relative 9 %   Monocytes Absolute 0.5  0.1 - 1.0 K/uL   Eosinophils Relative 4 %   Eosinophils Absolute 0.3 0.0 - 0.5 K/uL   Basophils Relative 1 %   Basophils Absolute 0.1 0.0 - 0.1 K/uL   Immature Granulocytes 0 %   Abs Immature Granulocytes 0.02 0.00 - 0.07 K/uL    Comment: Performed at [facility], [address], [place], [place] [address]  D-dimer, quantitative     Status: None   Collection Time: 10/21/21 12:57 AM  Result Value Ref Range   D-Dimer, Quant <0.27 0.00 - 0.50 ug/mL-FEU    Comment: (NOTE) At the manufacturer cut-off value of 0.5 g/mL FEU, this assay has a negative predictive value of 95-100%.This assay is intended for use in conjunction with a clinical pretest probability (PTP) assessment model to exclude pulmonary embolism  (PE) and deep venous thrombosis (DVT) in outpatients suspected of PE or DVT. Results should be correlated with clinical presentation. Performed at [facility], [address], [place], [place] [address]     DG Chest Port 1 View  Result Date: 10/20/2021 CLINICAL DATA:  Chest pain and shortness of breath EXAM: PORTABLE CHEST 1 VIEW COMPARISON:  Radiographs 04/13/2021 FINDINGS: No focal consolidation, pleural effusion, or pneumothorax. Normal cardiomediastinal silhouette. No acute osseous abnormality. IMPRESSION: No active disease. Electronically Signed   By: Minerva Fester M.D.   On: 10/20/2021 17:50    Review of Systems  Constitutional:  Negative for fever.  HENT:  Negative for sore throat.   Respiratory:  Positive for shortness of breath.   Cardiovascular:  Positive for chest pain. Negative for palpitations and leg swelling.  Gastrointestinal:  Negative for abdominal distention.  Genitourinary:  Negative for difficulty urinating.  Neurological:  Negative for dizziness and headaches.   Blood pressure (!) 152/97, pulse 66, temperature 98 F (36.7 C), temperature source Oral, resp. rate 16, height 6' (1.829 m), weight 136.1 kg, SpO2 97 %. Physical Exam Constitutional:      Appearance: He is well-developed.  HENT:     Head: Normocephalic and atraumatic.  Eyes:     Extraocular Movements: Extraocular movements intact.     Pupils: Pupils are equal, round, and reactive to light.  Cardiovascular:     Rate and Rhythm: Normal rate and regular rhythm.     Heart sounds: Murmur heard.     Gallop present. S4 sounds present.  Pulmonary:     Effort: Pulmonary effort is normal.     Breath sounds: Normal breath sounds.  Abdominal:     General: There is no abdominal bruit.     Palpations: There is no fluid wave or hepatomegaly.  Musculoskeletal:     Cervical back: Normal range of motion and neck supple.     Comments: No clubbing cyanosis or edema  Skin:    General: Skin  is warm and dry.  Neurological:     Mental Status: He is alert.     Assessment/Plan: Status post hypertensive emergency Noncardiac chest pain Minimally elevated high-sensitivity troponin I secondary to 1 Nonischemic dilated cardiomyopathy Hypertensive heart disease with systolic dysfunction Morbid obesity Obstructive sleep apnea History of tobacco and EtOH abuse History of COVID-19 infection in the past Hypokalemia Plan Start metoprolol succinate 50 mg daily Replace K Discussed with patient at length regarding compliance with medications follow-up and smoking and alcohol cessation We will switch losartan to Entresto and add Jardiance as outpatient once insurance issues are resolved and patient able to afford Please arrange for CPAP prior to discharge Okay  to discharge from cardiac point of failure Follow-up with me in 1 week   Rinaldo Cloud 10/21/2021, 9:03 AM

## 2021-10-23 ENCOUNTER — Other Ambulatory Visit (HOSPITAL_COMMUNITY): Payer: Self-pay

## 2022-03-22 IMAGING — CT CT ANGIO CHEST
2 of 6 series · 18 of 36 positions shown · IV contrast (agent unspecified)
Comparison: None.

CLINICAL DATA: Pulmonary embolism (PE) suspected, neg D-dimer

EXAM:
CT ANGIOGRAPHY CHEST WITH CONTRAST
TECHNIQUE: Multidetector CT imaging of the chest was performed using the
standard protocol during bolus administration of intravenous
contrast. Multiplanar CT image reconstructions and MIPs were
obtained to evaluate the vascular anatomy.

[Series 6: thins · axial · 0.78mm/px · z∈[-275,-3]mm · 17 of 306 slices shown]
[im 17/306  lung]
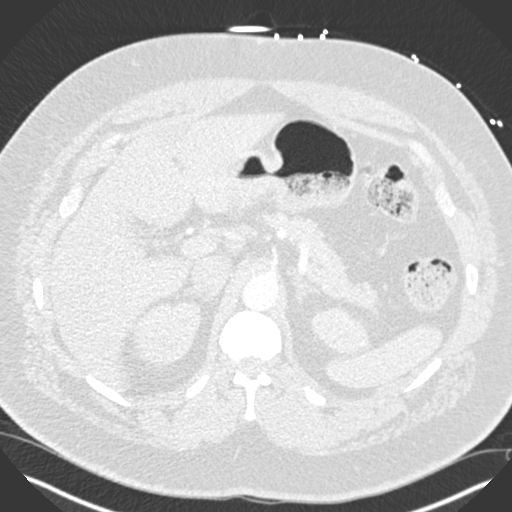
[im 34/306  mediastinal]
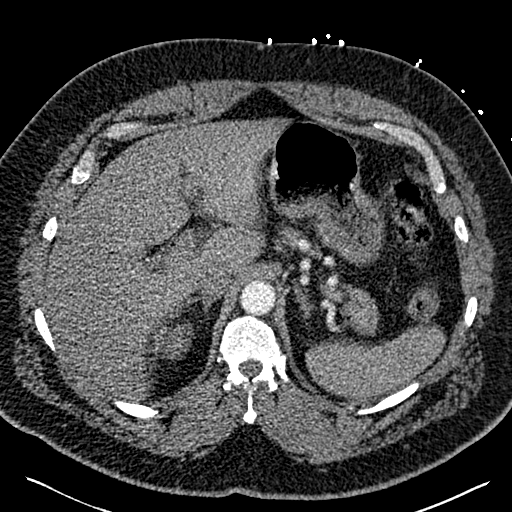
[im 51/306  lung]
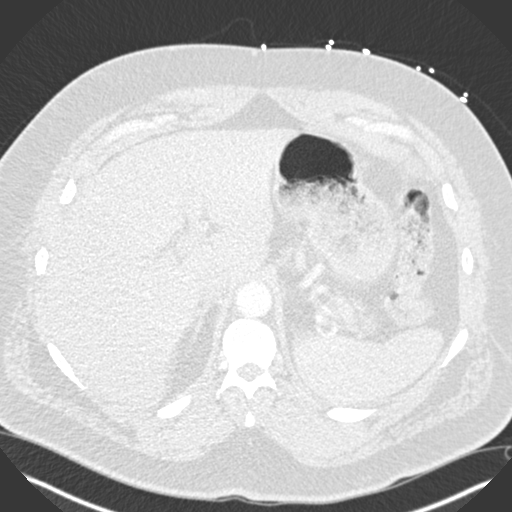
[im 68/306  mediastinal]
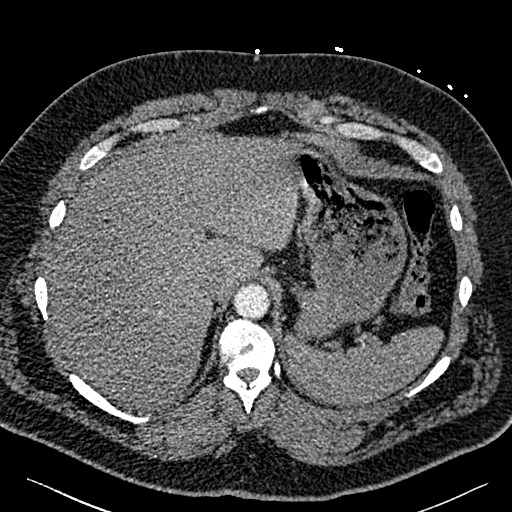
[im 85/306  lung]
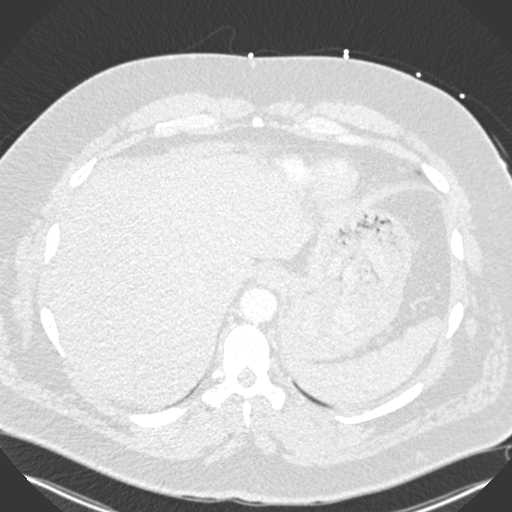
[im 102/306  mediastinal]
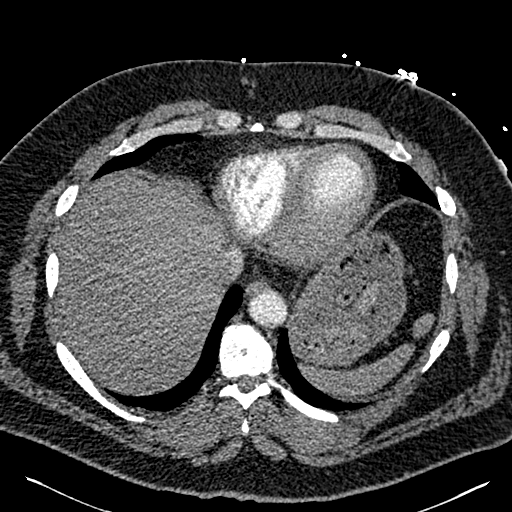
[im 119/306  lung]
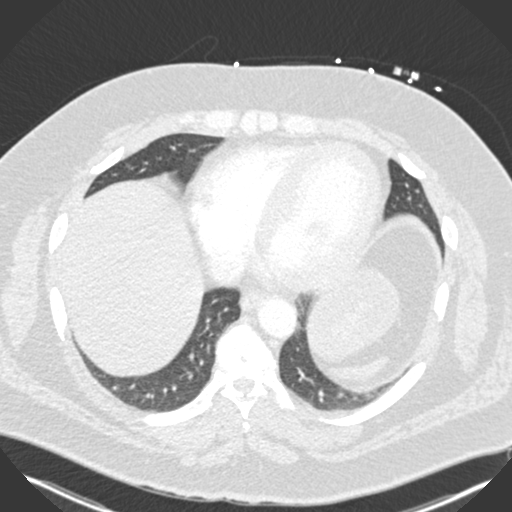
[im 136/306  mediastinal]
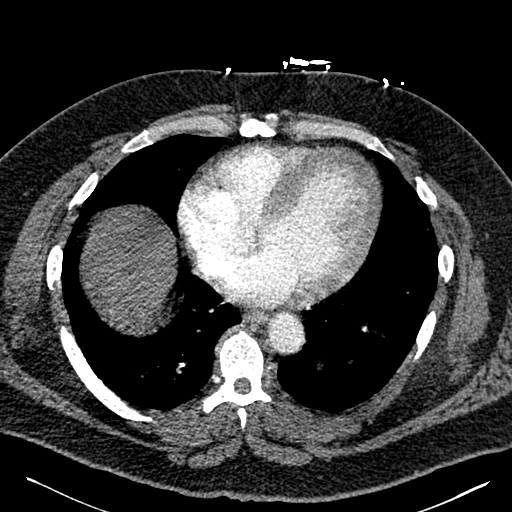
[im 153/306  lung]
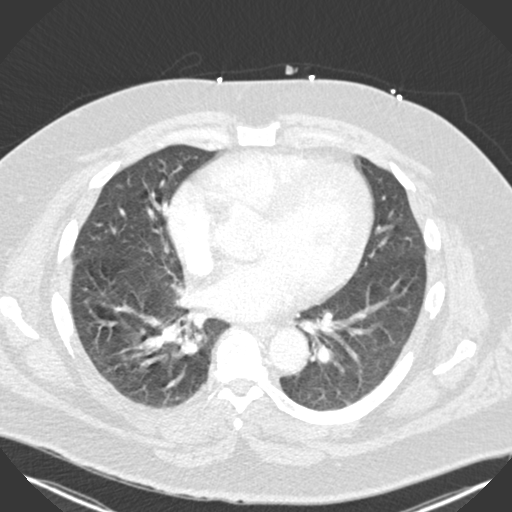
[im 170/306  mediastinal]
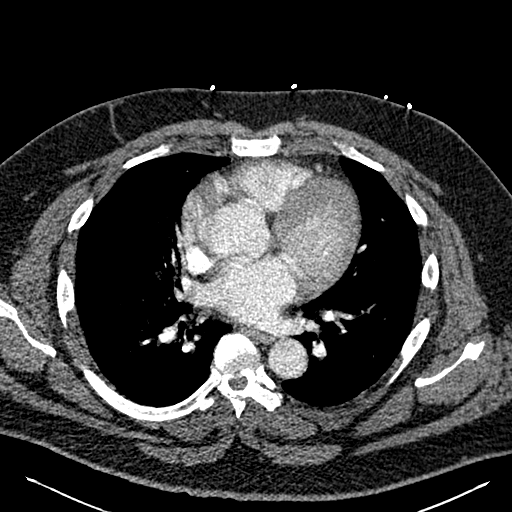
[im 187/306  lung]
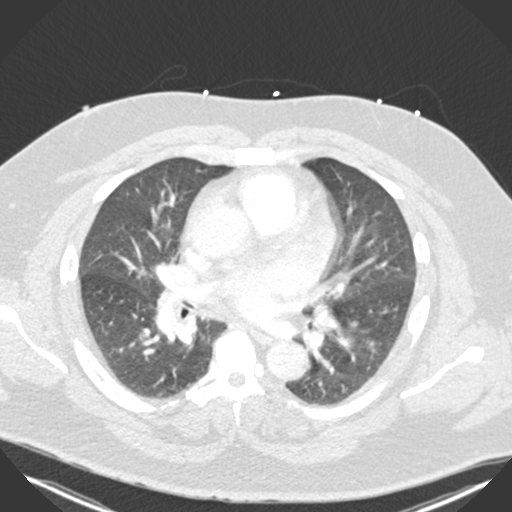
[im 204/306  mediastinal]
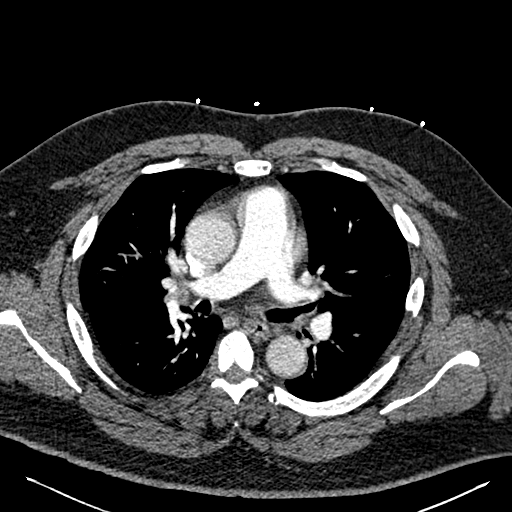
[im 221/306  lung]
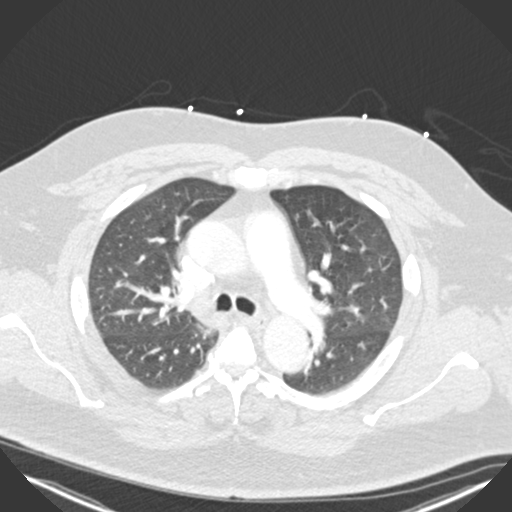
[im 238/306  mediastinal]
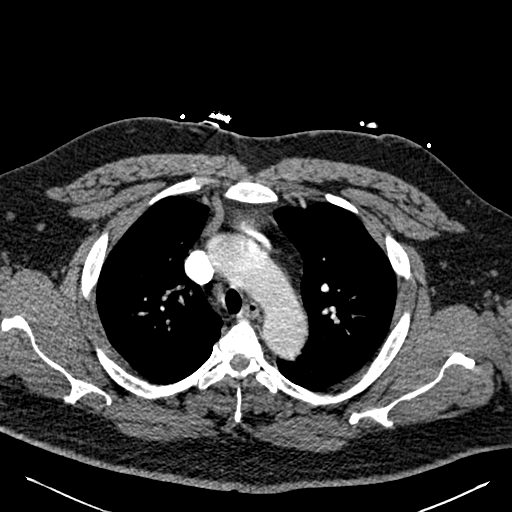
[im 255/306  lung]
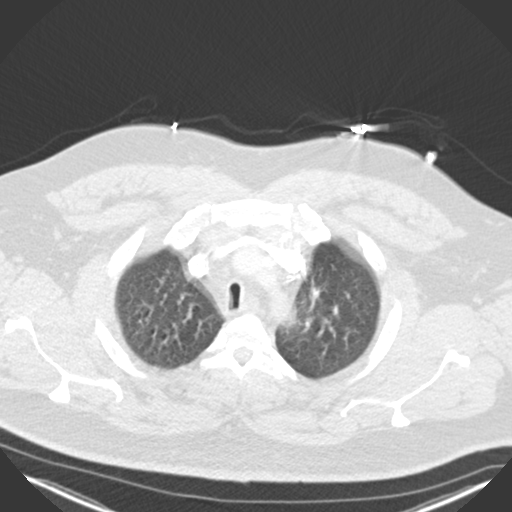
[im 272/306  mediastinal]
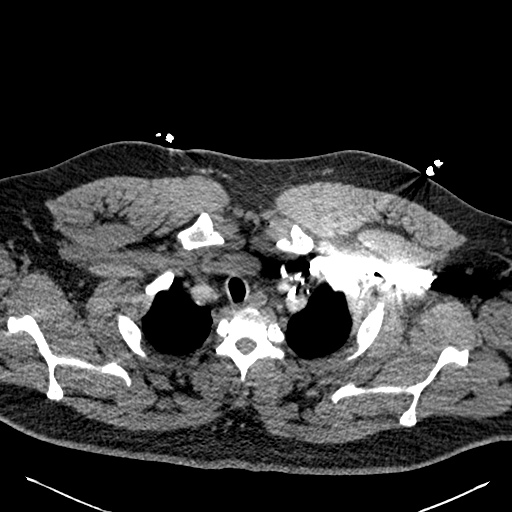
[im 289/306  lung]
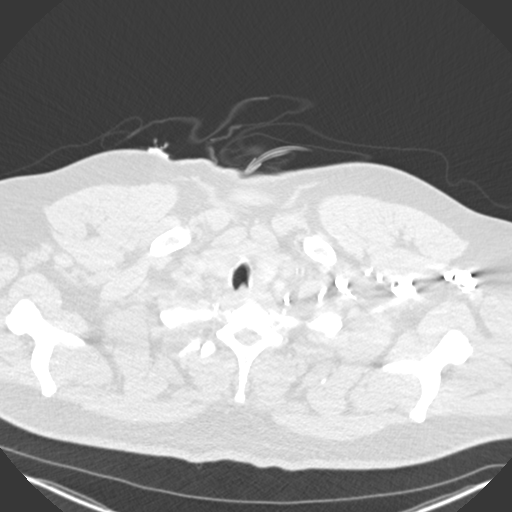

[Series 8: coronal mpr · coronal · 0.64mm/px · 1 of 168 slices shown]
[im 84/168  mediastinal]
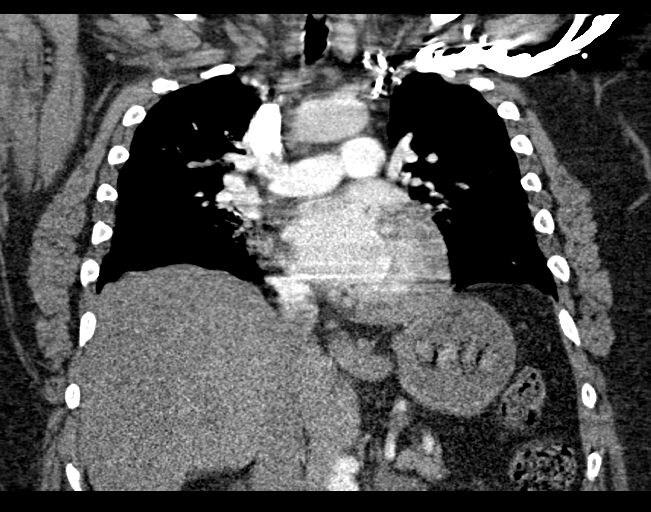

[18 of 36 positions shown; findings below may reference images not displayed]

RADIATION DOSE REDUCTION: This exam was performed according to the
departmental dose-optimization program which includes automated
exposure control, adjustment of the mA and/or kV according to
patient size and/or use of iterative reconstruction technique.

CONTRAST:  100mL OMNIPAQUE IOHEXOL 350 MG/ML SOLN
FINDINGS: Cardiovascular: Mild motion artifact at the lung bases slightly
limits the quality of the examination. The examination, however, is
still diagnostic. No intraluminal filling defect is identified to
suggest acute pulmonary embolism. Central pulmonary arteries are of
normal caliber. No significant coronary artery calcification. Global
cardiac size within normal limits. No pericardial effusion. The
thoracic aorta is unremarkable.

Mediastinum/Nodes: No enlarged mediastinal, hilar, or axillary lymph
nodes. Thyroid gland, trachea, and esophagus demonstrate no
significant findings.

Lungs/Pleura: Lungs are clear. No pleural effusion or pneumothorax.

Upper Abdomen: No acute abnormality.

Musculoskeletal: Osseous structures are age-appropriate. No acute
bone abnormality.

Review of the MIP images confirms the above findings.
IMPRESSION: No pulmonary embolism.  No acute intrathoracic pathology identified.

## 2022-08-02 DIAGNOSIS — I5022 Chronic systolic (congestive) heart failure: Secondary | ICD-10-CM | POA: Diagnosis not present

## 2022-08-02 DIAGNOSIS — I10 Essential (primary) hypertension: Secondary | ICD-10-CM | POA: Diagnosis not present

## 2022-08-02 DIAGNOSIS — E782 Mixed hyperlipidemia: Secondary | ICD-10-CM | POA: Diagnosis not present

## 2022-08-02 DIAGNOSIS — G4733 Obstructive sleep apnea (adult) (pediatric): Secondary | ICD-10-CM | POA: Diagnosis not present

## 2022-08-02 DIAGNOSIS — J4530 Mild persistent asthma, uncomplicated: Secondary | ICD-10-CM | POA: Diagnosis not present

## 2022-08-02 DIAGNOSIS — M25511 Pain in right shoulder: Secondary | ICD-10-CM | POA: Diagnosis not present

## 2022-08-02 DIAGNOSIS — I2510 Atherosclerotic heart disease of native coronary artery without angina pectoris: Secondary | ICD-10-CM | POA: Diagnosis not present

## 2022-08-02 DIAGNOSIS — Z131 Encounter for screening for diabetes mellitus: Secondary | ICD-10-CM | POA: Diagnosis not present

## 2022-08-09 DIAGNOSIS — J4530 Mild persistent asthma, uncomplicated: Secondary | ICD-10-CM | POA: Diagnosis not present

## 2022-08-09 DIAGNOSIS — M25511 Pain in right shoulder: Secondary | ICD-10-CM | POA: Diagnosis not present

## 2022-08-09 DIAGNOSIS — I2510 Atherosclerotic heart disease of native coronary artery without angina pectoris: Secondary | ICD-10-CM | POA: Diagnosis not present

## 2022-08-09 DIAGNOSIS — R0602 Shortness of breath: Secondary | ICD-10-CM | POA: Diagnosis not present

## 2022-08-09 DIAGNOSIS — E782 Mixed hyperlipidemia: Secondary | ICD-10-CM | POA: Diagnosis not present

## 2022-08-09 DIAGNOSIS — I10 Essential (primary) hypertension: Secondary | ICD-10-CM | POA: Diagnosis not present

## 2022-08-09 DIAGNOSIS — I5022 Chronic systolic (congestive) heart failure: Secondary | ICD-10-CM | POA: Diagnosis not present

## 2022-08-09 DIAGNOSIS — G4733 Obstructive sleep apnea (adult) (pediatric): Secondary | ICD-10-CM | POA: Diagnosis not present

## 2022-08-10 ENCOUNTER — Encounter: Payer: Self-pay | Admitting: Cardiology

## 2022-08-10 ENCOUNTER — Ambulatory Visit: Payer: Medicaid Other | Admitting: Cardiology

## 2022-08-10 VITALS — BP 134/92 | HR 79 | Resp 16 | Ht 72.0 in | Wt 310.2 lb

## 2022-08-10 DIAGNOSIS — I10 Essential (primary) hypertension: Secondary | ICD-10-CM

## 2022-08-10 DIAGNOSIS — I428 Other cardiomyopathies: Secondary | ICD-10-CM

## 2022-08-10 DIAGNOSIS — I5032 Chronic diastolic (congestive) heart failure: Secondary | ICD-10-CM | POA: Diagnosis not present

## 2022-08-10 DIAGNOSIS — M19011 Primary osteoarthritis, right shoulder: Secondary | ICD-10-CM

## 2022-08-10 DIAGNOSIS — Z6841 Body Mass Index (BMI) 40.0 and over, adult: Secondary | ICD-10-CM | POA: Diagnosis not present

## 2022-08-10 MED ORDER — TRAMADOL HCL 50 MG PO TABS: 50.0000 mg | ORAL_TABLET | ORAL | 0 refills | Status: DC

## 2022-08-10 MED ORDER — CARVEDILOL 12.5 MG PO TABS: 12.5000 mg | ORAL_TABLET | Freq: Two times a day (BID) | ORAL | 2 refills | Status: DC

## 2022-08-10 NOTE — Progress Notes (Unsigned)
Primary Physician/Referring:  Norm Salt, PA  Patient ID: Jesse Morris, male    DOB: [birth year], 42 y.o.   MRN: [id number]  Chief Complaint  Patient presents with   Congestive Heart Failure   Coronary Artery Disease   Hypertension   New Patient (Initial Visit)    Referred by [name], PA   HPI:    Jesse Morris  is a 42 y.o. Hispanic male patient with nonischemic cardiomyopathy most probably related to uncontrolled hypertension, morbid obesity presents to establish care, last hospitalization/ED visit in August 2023.  Except for chronic dyspnea on exertion without PND orthopnea or chest pain or palpitations, leg edema, no other complaints.  Jesse Morris has noticed right shoulder has been tender and is seeking help with orthopedics next week.  Past Medical History:  Diagnosis Date   CHF (congestive heart failure) (HCC)    Hypertension    Obesity    Sleep apnea    Past Surgical History:  Procedure Laterality Date   HAND TENDON SURGERY     RIGHT/LEFT HEART CATH AND CORONARY ANGIOGRAPHY N/A 04/17/2021   Procedure: RIGHT/LEFT HEART CATH AND CORONARY ANGIOGRAPHY;  Surgeon: Laurey Morale, MD;  Location: [facility] INVASIVE CV LAB;  Service: Cardiovascular;  Laterality: N/A;   History reviewed. No pertinent family history.  Social History   Tobacco Use   Smoking status: Former    Packs/day: 0.25    Years: 15.00    Additional pack years: 0.00    Total pack years: 3.75    Types: Cigarettes    Quit date: 2015    Years since quitting: 9.4   Smokeless tobacco: Never  Substance Use Topics   Alcohol use: Yes    Alcohol/week: 2.0 standard drinks of alcohol    Types: 2 Shots of liquor per week   Marital Status: Single  ROS  Review of Systems  Cardiovascular:  Positive for dyspnea on exertion. Negative for chest pain and leg swelling.  Musculoskeletal:  Positive for arthritis (right shoulder).   Objective      08/10/2022   11:23 AM 08/10/2022   11:16 AM 10/21/2021   10:00 AM   Vitals with BMI  Height  6\' 0"    Weight  310 lbs 3 oz   BMI  42.06   Systolic 134 146 409  Diastolic 92 107 86  Pulse 79 91 86   SpO2: 94 %   Physical Exam Constitutional:      Appearance: Jesse Morris is morbidly obese.  Neck:     Vascular: No carotid bruit or JVD.  Cardiovascular:     Rate and Rhythm: Normal rate and regular rhythm.     Pulses: Intact distal pulses.          Radial pulses are 0 on the right side and 2+ on the left side.     Heart sounds: Normal heart sounds. No murmur heard.    No gallop.  Pulmonary:     Effort: Pulmonary effort is normal.     Breath sounds: Normal breath sounds.  Abdominal:     General: Bowel sounds are normal.     Palpations: Abdomen is soft.  Musculoskeletal:     Right lower leg: No edema.     Left lower leg: No edema.     Laboratory examination:   Recent Labs    10/20/21 1739 10/21/21 0057  NA 138 140  K 2.9* 3.2*  CL 102 103  CO2 25 29  GLUCOSE 145* 104*  BUN 14 13  CREATININE 0.97 1.11  CALCIUM 8.9 8.8*  GFRNONAA >60 >60    Lab Results  Component Value Date   GLUCOSE 104 (H) 10/21/2021   NA 140 10/21/2021   K 3.2 (L) 10/21/2021   CL 103 10/21/2021   CO2 29 10/21/2021   BUN 13 10/21/2021   CREATININE 1.11 10/21/2021   GFRNONAA >60 10/21/2021   CALCIUM 8.8 (L) 10/21/2021   PROT 7.2 10/21/2021   ALBUMIN 4.0 10/21/2021   BILITOT 0.6 10/21/2021   ALKPHOS 31 (L) 10/21/2021   AST 30 10/21/2021   ALT 35 10/21/2021   ANIONGAP 8 10/21/2021      Lab Results  Component Value Date   ALT 35 10/21/2021   AST 30 10/21/2021   ALKPHOS 31 (L) 10/21/2021   BILITOT 0.6 10/21/2021       Latest Ref Rng & Units 10/21/2021   12:57 AM 04/13/2021    6:57 PM  Hepatic Function  Total Protein 6.5 - 8.1 g/dL 7.2  7.3   Albumin 3.5 - 5.0 g/dL 4.0  3.8   AST 15 - 41 U/L 30  29   ALT 0 - 44 U/L 35  26   Alk Phosphatase 38 - 126 U/L 31  43   Total Bilirubin 0.3 - 1.2 mg/dL 0.6  0.1     Lab Results  Component Value Date   CHOL  228 (H) 04/15/2021   HDL 60 04/15/2021   LDLCALC 144 (H) 04/15/2021   TRIG 121 04/15/2021   CHOLHDL 3.8 04/15/2021    HEMOGLOBIN A1C Lab Results  Component Value Date   HGBA1C 5.4 04/17/2021   MPG 108.28 04/17/2021   TSH No results for input(s): "TSH" in the last 8760 hours.  External labs:   NA  Radiology:    Cardiac Studies:   Echocardiogram 04/16/2021:  1. Left ventricular ejection fraction, by estimation, is 45 to 50%. The left ventricle has mildly decreased function. The left ventricle demonstrates global hypokinesis. There is mild concentric left ventricular hypertrophy. Left ventricular diastolic parameters are consistent with Grade I diastolic dysfunction (impaired relaxation). 2. Right ventricular systolic function is normal. The right ventricular size is normal. 3. The mitral valve is normal in structure. No evidence of mitral valve regurgitation. 4. The aortic valve is normal in structure. Aortic valve regurgitation is not visualized. 5. Aortic dilatation noted. There is borderline dilatation of the ascending aorta. 6. The inferior vena cava is normal in size with <50% respiratory variability, suggesting right atrial pressure of 8 mmHg.  No significant change from 04/14/2021.  Previously noted inferobasal hypokinesis not present.  Right and left heart catheterization 05/07/2021 [facility] Procedural Findings: Hemodynamics (mmHg) RA mean 3 RV 24/9 PA 32/19, mean 25 PCWP mean 11 LV 141/6 AO 133/96  Oxygen saturations: PA 67% AO 93%  Cardiac Output (Fick) 9.08  Cardiac Index (Fick) 3.48     EKG:   EKG 08/10/2022: Normal sinus rhythm at rate of 91 bpm, left atrial enlargement, normal axis.  Nonspecific high lateral T wave abnormality.    Medications and allergies  No Known Allergies   Medication list   Current Outpatient Medications:    atorvastatin (LIPITOR) 40 MG tablet, Take 1 tablet (40 mg total) by mouth daily., Disp: 30 tablet, Rfl: 1   carvedilol  (COREG) 12.5 MG tablet, Take 1 tablet (12.5 mg total) by mouth 2 (two) times daily., Disp: 60 tablet, Rfl: 2   losartan (COZAAR) 100 MG tablet, Take 1 tablet (100 mg total) by mouth daily., Disp:  30 tablet, Rfl: 1   traMADol (ULTRAM) 50 MG tablet, Take 1 tablet (50 mg total) by mouth as directed. 1-2 tab at night as needed, Disp: 30 tablet, Rfl: 0  Assessment     ICD-10-CM   1. Non-ischemic cardiomyopathy (HCC)  I42.8 carvedilol (COREG) 12.5 MG tablet    2. Chronic diastolic (congestive) heart failure (HCC)  I50.32     3. Primary hypertension  I10 EKG 12-Lead    carvedilol (COREG) 12.5 MG tablet    4. Class 3 severe obesity due to excess calories with serious comorbidity and body mass index (BMI) of 40.0 to 44.9 in adult (HCC)  E66.01    Z68.41     5. Arthritis of right shoulder region  M19.011 traMADol (ULTRAM) 50 MG tablet       Orders Placed This Encounter  Procedures   EKG 12-Lead    Meds ordered this encounter  Medications   carvedilol (COREG) 12.5 MG tablet    Sig: Take 1 tablet (12.5 mg total) by mouth 2 (two) times daily.    Dispense:  60 tablet    Refill:  2   traMADol (ULTRAM) 50 MG tablet    Sig: Take 1 tablet (50 mg total) by mouth as directed. 1-2 tab at night as needed    Dispense:  30 tablet    Refill:  0    Medications Discontinued During This Encounter  Medication Reason   metoprolol succinate (TOPROL XL) 50 MG 24 hr tablet    albuterol (PROVENTIL HFA;VENTOLIN HFA) 108 (90 Base) MCG/ACT inhaler Discontinued by provider   BREO ELLIPTA 50-25 MCG/INH AEPB      Recommendations:   Jesse Morris is a 42 y.o. Hispanic male patient with nonischemic cardiomyopathy most probably related to uncontrolled hypertension, morbid obesity presents to establish care, last hospitalization/ED visit in August 2023.  1. Non-ischemic cardiomyopathy (HCC) Extensive review of his chart, patient has nonischemic cardiomyopathy related to probably hypertension.  I will discontinue  metoprolol and switch him to carvedilol, advised him Jesse Morris could certainly discontinue albuterol and Breo Ellipta prescribed him for dyspnea which is probably related to CHF than any COPD or emphysema. - carvedilol (COREG) 12.5 MG tablet; Take 1 tablet (12.5 mg total) by mouth 2 (two) times daily.  Dispense: 60 tablet; Refill: 2  2. Chronic diastolic (congestive) heart failure (HCC) Patient presently on losartan 100 mg daily, continue the same.  No acute decompensated heart failure symptoms or signs.  3. Primary hypertension Long discussion regarding hypertension, diet, weight loss and exercise, metoprolol.  Total weight loss, will continue losartan.  Will monitor his symptoms of dyspnea and hypertension closely for the next several weeks, Jesse Morris is tentatively seen back in 6 to 8 weeks. - EKG 12-Lead - carvedilol (COREG) 12.5 MG tablet; Take 1 tablet (12.5 mg total) by mouth 2 (two) times daily.  Dispense: 60 tablet; Refill: 2  4. Class 3 severe obesity due to excess calories with serious comorbidity and body mass index (BMI) of 40.0 to 44.9 in adult (HCC) Weight loss discussed, low calorie diet discussed.  5. Arthritis of right shoulder region Patient complains of right arm pain since cardiac catheterization, although Jesse Morris has absent right radial pulse, cannot think Jesse Morris has had any neurovascular injury and right shoulder movement is mildly restricted due to arthritis like pain, tramadol prescribed, Jesse Morris has an appointment to follow-up with orthopedics in the next 1 week. - traMADol (ULTRAM) 50 MG tablet; Take 1 tablet (50 mg total) by mouth as directed.  1-2 tab at night as needed  Dispense: 30 tablet; Refill: 0     Yates Decamp, MD, [facility] 08/12/2022, 11:41 PM Office: [phone]

## 2022-08-16 DIAGNOSIS — M25511 Pain in right shoulder: Secondary | ICD-10-CM | POA: Diagnosis not present

## 2022-08-17 DIAGNOSIS — G4733 Obstructive sleep apnea (adult) (pediatric): Secondary | ICD-10-CM | POA: Diagnosis not present

## 2022-08-28 ENCOUNTER — Encounter: Payer: Self-pay | Admitting: Pulmonary Disease

## 2022-09-12 DIAGNOSIS — R519 Headache, unspecified: Secondary | ICD-10-CM | POA: Diagnosis not present

## 2022-09-16 DIAGNOSIS — G4733 Obstructive sleep apnea (adult) (pediatric): Secondary | ICD-10-CM | POA: Diagnosis not present

## 2022-10-09 ENCOUNTER — Ambulatory Visit: Payer: 59 | Admitting: Cardiology

## 2022-10-10 ENCOUNTER — Ambulatory Visit: Payer: 59 | Admitting: Cardiology

## 2022-10-17 DIAGNOSIS — G4733 Obstructive sleep apnea (adult) (pediatric): Secondary | ICD-10-CM | POA: Diagnosis not present

## 2022-11-17 DIAGNOSIS — G4733 Obstructive sleep apnea (adult) (pediatric): Secondary | ICD-10-CM | POA: Diagnosis not present

## 2022-12-17 DIAGNOSIS — G4733 Obstructive sleep apnea (adult) (pediatric): Secondary | ICD-10-CM | POA: Diagnosis not present

## 2022-12-19 ENCOUNTER — Other Ambulatory Visit: Payer: Self-pay

## 2022-12-19 ENCOUNTER — Encounter (HOSPITAL_COMMUNITY): Payer: Self-pay

## 2022-12-19 ENCOUNTER — Emergency Department (HOSPITAL_COMMUNITY)
Admission: EM | Admit: 2022-12-19 | Discharge: 2022-12-19 | Disposition: A | Discharge status: Home / Self Care | Payer: 59 | Attending: Emergency Medicine | Admitting: Emergency Medicine

## 2022-12-19 ENCOUNTER — Emergency Department (HOSPITAL_COMMUNITY): Payer: 59

## 2022-12-19 DIAGNOSIS — S93491A Sprain of other ligament of right ankle, initial encounter: Secondary | ICD-10-CM | POA: Diagnosis not present

## 2022-12-19 DIAGNOSIS — M7661 Achilles tendinitis, right leg: Secondary | ICD-10-CM | POA: Diagnosis not present

## 2022-12-19 DIAGNOSIS — S93431A Sprain of tibiofibular ligament of right ankle, initial encounter: Secondary | ICD-10-CM | POA: Diagnosis not present

## 2022-12-19 DIAGNOSIS — X501XXA Overexertion from prolonged static or awkward postures, initial encounter: Secondary | ICD-10-CM | POA: Insufficient documentation

## 2022-12-19 DIAGNOSIS — M25571 Pain in right ankle and joints of right foot: Secondary | ICD-10-CM | POA: Diagnosis not present

## 2022-12-19 DIAGNOSIS — S99911A Unspecified injury of right ankle, initial encounter: Secondary | ICD-10-CM | POA: Diagnosis not present

## 2022-12-19 NOTE — Discharge Instructions (Addendum)
Contact a health care provider if: You have bruising or swelling that get worse all of a sudden. Your pain does not get better with medicine. Get help right away if: Your foot or toes become numb or blue. You have severe pain that gets worse.

## 2022-12-19 NOTE — ED Provider Notes (Signed)
[place] EMERGENCY DEPARTMENT AT [facility] Provider Note   CSN: [id number] Arrival date & time: 12/19/22  1747     History  Chief Complaint  Patient presents with   Ankle Pain    Jesse Morris is a 42 y.o. male who presents emergency department chief complaint of right ankle injury.  Patient states that he fell yesterday and had an inversion injury to the right ankle.  He states that he was able to ambulate but when he woke up this morning he had a ton of swelling and pain in the right ankle especially on the lateral side.  Patient states he does not have a car and he walks so he has to keep walking wanted to make sure he did not have a broken ankle since it was quite painful today.  He denies numbness or tingling   Ankle Pain      Home Medications Prior to Admission medications   Medication Sig Start Date End Date Taking? Authorizing Provider  atorvastatin (LIPITOR) 40 MG tablet Take 1 tablet (40 mg total) by mouth daily. 10/22/21   Noralee Stain, DO  carvedilol (COREG) 12.5 MG tablet Take 1 tablet (12.5 mg total) by mouth 2 (two) times daily. 08/10/22 11/08/22  Yates Decamp, MD  losartan (COZAAR) 100 MG tablet Take 1 tablet (100 mg total) by mouth daily. 10/21/21   Noralee Stain, DO  traMADol (ULTRAM) 50 MG tablet Take 1 tablet (50 mg total) by mouth as directed. 1-2 tab at night as needed 08/10/22   Yates Decamp, MD      Allergies    Patient has no known allergies.    Review of Systems   Review of Systems  Physical Exam Updated Vital Signs BP 114/60   Pulse 90   Temp 98.7 F (37.1 C) (Oral)   Resp 18   Ht 6' (1.829 m)   Wt (!) 149.2 kg   SpO2 98%   BMI 44.62 kg/m  Physical Exam Vitals and nursing note reviewed.  Constitutional:      General: He is not in acute distress.    Appearance: He is well-developed. He is not diaphoretic.  HENT:     Head: Normocephalic and atraumatic.  Eyes:     General: No scleral icterus.    Conjunctiva/sclera:  Conjunctivae normal.  Cardiovascular:     Rate and Rhythm: Normal rate and regular rhythm.     Heart sounds: Normal heart sounds.  Pulmonary:     Effort: Pulmonary effort is normal. No respiratory distress.     Breath sounds: Normal breath sounds.  Abdominal:     Palpations: Abdomen is soft.     Tenderness: There is no abdominal tenderness.  Musculoskeletal:     Cervical back: Normal range of motion and neck supple.     Comments: Exam of the injured ankle reveals swelling and tenderness over the lateral malleolus. No tenderness over the medial aspect of the ankle. The fifth metatarsal is not tender. The ankle joint is intact without excessive opening on stressing. X-Ray shows fracture to be negative. The rest of the foot, ankle and leg exam is normal.   Skin:    General: Skin is warm and dry.  Neurological:     Mental Status: He is alert.  Psychiatric:        Behavior: Behavior normal.     ED Results / Procedures / Treatments   Labs (all labs ordered are listed, but only abnormal results are displayed) Labs Reviewed -  No data to display  EKG None  Radiology DG Ankle Complete Right  Result Date: 12/19/2022 CLINICAL DATA:  Right lateral ankle pain after twisting injury. Fall yesterday. EXAM: RIGHT ANKLE - COMPLETE 3+ VIEW COMPARISON:  Report from ankle MRI 03/14/2018, images unavailable. FINDINGS: There is no evidence of fracture, dislocation, or joint effusion. The ankle mortise is preserved. Chronic appearing spurring of the hindfoot. Minimal Achilles tendon enthesophyte. Soft tissue edema is most prominent laterally. Scattered soft tissue phleboliths. IMPRESSION: Soft tissue edema without acute fracture or subluxation. Electronically Signed   By: Narda Rutherford M.D.   On: 12/19/2022 21:14    Procedures Procedures    Medications Ordered in ED Medications - No data to display  ED Course/ Medical Decision Making/ A&P                                 Medical Decision  Making Amount and/or Complexity of Data Reviewed Radiology: ordered and independent interpretation performed.    Details: Visualized and interpreted a right ankle x-ray which shows no acute fractures or dislocations.   Patient here with right ankle injury.  He appears to have an ankle sprain.  Patient given cam walker and crutches.  Patient states he is fine with taking Tylenol.  Will have the patient follow-up with outpatient orthopedics for further evaluation.  Patient appears otherwise appropriate for discharge at this time        Final Clinical Impression(s) / ED Diagnoses Final diagnoses:  Sprain of anterior talofibular ligament of right ankle, initial encounter    Rx / DC Orders ED Discharge Orders     None         Arthor Captain, PA-C 12/19/22 2158    Ernie Avena, MD 12/20/22 [phone]

## 2022-12-19 NOTE — ED Triage Notes (Signed)
Pt twisted right ankle yesterday, unable to bear weight since injury. Sensation and pulses intact

## 2023-01-29 ENCOUNTER — Other Ambulatory Visit: Payer: Self-pay

## 2023-03-01 ENCOUNTER — Emergency Department (HOSPITAL_COMMUNITY): Payer: Medicaid Other

## 2023-03-01 ENCOUNTER — Encounter (HOSPITAL_COMMUNITY): Payer: Self-pay

## 2023-03-01 ENCOUNTER — Inpatient Hospital Stay (HOSPITAL_COMMUNITY)
Admission: EM | Admit: 2023-03-01 | Discharge: 2023-03-02 | DRG: 291 | Discharge status: Against Medical Advice | Payer: Medicaid Other | Attending: Internal Medicine | Admitting: Internal Medicine

## 2023-03-01 ENCOUNTER — Other Ambulatory Visit: Payer: Self-pay

## 2023-03-01 DIAGNOSIS — G4733 Obstructive sleep apnea (adult) (pediatric): Secondary | ICD-10-CM | POA: Diagnosis present

## 2023-03-01 DIAGNOSIS — Z6841 Body Mass Index (BMI) 40.0 and over, adult: Secondary | ICD-10-CM | POA: Diagnosis not present

## 2023-03-01 DIAGNOSIS — I428 Other cardiomyopathies: Secondary | ICD-10-CM | POA: Diagnosis present

## 2023-03-01 DIAGNOSIS — I509 Heart failure, unspecified: Secondary | ICD-10-CM | POA: Diagnosis not present

## 2023-03-01 DIAGNOSIS — I110 Hypertensive heart disease with heart failure: Principal | ICD-10-CM | POA: Diagnosis present

## 2023-03-01 DIAGNOSIS — Z91198 Patient's noncompliance with other medical treatment and regimen for other reason: Secondary | ICD-10-CM | POA: Diagnosis not present

## 2023-03-01 DIAGNOSIS — I5023 Acute on chronic systolic (congestive) heart failure: Secondary | ICD-10-CM | POA: Diagnosis present

## 2023-03-01 DIAGNOSIS — I2489 Other forms of acute ischemic heart disease: Secondary | ICD-10-CM | POA: Diagnosis present

## 2023-03-01 DIAGNOSIS — Z79899 Other long term (current) drug therapy: Secondary | ICD-10-CM | POA: Diagnosis not present

## 2023-03-01 DIAGNOSIS — E66813 Obesity, class 3: Secondary | ICD-10-CM | POA: Diagnosis present

## 2023-03-01 DIAGNOSIS — I160 Hypertensive urgency: Principal | ICD-10-CM | POA: Diagnosis present

## 2023-03-01 DIAGNOSIS — E876 Hypokalemia: Secondary | ICD-10-CM | POA: Diagnosis present

## 2023-03-01 DIAGNOSIS — Z87891 Personal history of nicotine dependence: Secondary | ICD-10-CM | POA: Diagnosis not present

## 2023-03-01 LAB — BASIC METABOLIC PANEL
Anion gap: 13 (ref 5–15)
BUN: 16 mg/dL (ref 6–20)
CO2: 26 mmol/L (ref 22–32)
Calcium: 9.1 mg/dL (ref 8.9–10.3)
Chloride: 98 mmol/L (ref 98–111)
Creatinine, Ser: 1.07 mg/dL (ref 0.61–1.24)
GFR, Estimated: >60 mL/min (ref >60)
Glucose, Bld: 101 mg/dL — ABNORMAL HIGH (ref 70–99)
Potassium: 3.4 mmol/L — ABNORMAL LOW (ref 3.5–5.1)
Sodium: 137 mmol/L (ref 135–145)

## 2023-03-01 LAB — TROPONIN I (HIGH SENSITIVITY)
Troponin I (High Sensitivity): 150 ng/L (ref <18)
Troponin I (High Sensitivity): 150 ng/L (ref <18)

## 2023-03-01 LAB — BRAIN NATRIURETIC PEPTIDE: B Natriuretic Peptide: 173 pg/mL — ABNORMAL HIGH (ref 0.0–100.0)

## 2023-03-01 LAB — CBC
HCT: 43.6 % (ref 39.0–52.0)
Hemoglobin: 13.8 g/dL (ref 13.0–17.0)
MCH: 29.2 pg (ref 26.0–34.0)
MCHC: 31.7 g/dL (ref 30.0–36.0)
MCV: 92.2 fL (ref 80.0–100.0)
Platelets: 300 10*3/uL (ref 150–400)
RBC: 4.73 MIL/uL (ref 4.22–5.81)
RDW: 14.6 % (ref 11.5–15.5)
WBC: 5.2 10*3/uL (ref 4.0–10.5)
nRBC: 0 % (ref 0.0–0.2)

## 2023-03-01 MED ORDER — ONDANSETRON HCL 4 MG/2ML IJ SOLN: 4.0000 mg | Freq: Four times a day (QID) | INTRAMUSCULAR | Status: DC | PRN

## 2023-03-01 MED ORDER — ACETAMINOPHEN 325 MG PO TABS: 650.0000 mg | ORAL_TABLET | Freq: Four times a day (QID) | ORAL | Status: DC | PRN

## 2023-03-01 MED ORDER — LOSARTAN POTASSIUM 25 MG PO TABS
100.0000 mg | ORAL_TABLET | Freq: Once | ORAL | Status: AC
  Administered 2023-03-01: 100 mg via ORAL
  Filled 2023-03-01: qty 4

## 2023-03-01 MED ORDER — ACETAMINOPHEN 650 MG RE SUPP: 650.0000 mg | Freq: Four times a day (QID) | RECTAL | Status: DC | PRN

## 2023-03-01 MED ORDER — LOSARTAN POTASSIUM 50 MG PO TABS
100.0000 mg | ORAL_TABLET | Freq: Every day | ORAL | Status: DC
  Administered 2023-03-02: 100 mg via ORAL
  Filled 2023-03-01: qty 2

## 2023-03-01 MED ORDER — POTASSIUM CHLORIDE 20 MEQ PO PACK
40.0000 meq | PACK | Freq: Once | ORAL | Status: AC
  Administered 2023-03-01: 40 meq via ORAL
  Filled 2023-03-01: qty 2

## 2023-03-01 MED ORDER — FUROSEMIDE 10 MG/ML IJ SOLN
40.0000 mg | Freq: Once | INTRAMUSCULAR | Status: AC
  Administered 2023-03-01: 40 mg via INTRAVENOUS
  Filled 2023-03-01: qty 4

## 2023-03-01 MED ORDER — HYDRALAZINE HCL 20 MG/ML IJ SOLN
10.0000 mg | Freq: Once | INTRAMUSCULAR | Status: AC
  Administered 2023-03-01: 10 mg via INTRAVENOUS
  Filled 2023-03-01: qty 1

## 2023-03-01 MED ORDER — ENOXAPARIN SODIUM 80 MG/0.8ML IJ SOSY
70.0000 mg | PREFILLED_SYRINGE | INTRAMUSCULAR | Status: DC
  Administered 2023-03-02: 70 mg via SUBCUTANEOUS
  Filled 2023-03-01: qty 0.8

## 2023-03-01 MED ORDER — FUROSEMIDE 10 MG/ML IJ SOLN: 20.0000 mg | Freq: Once | INTRAMUSCULAR | Status: DC

## 2023-03-01 MED ORDER — OXYCODONE HCL 5 MG PO TABS: 5.0000 mg | ORAL_TABLET | ORAL | Status: DC | PRN

## 2023-03-01 MED ORDER — CARVEDILOL 3.125 MG PO TABS: 3.1250 mg | ORAL_TABLET | Freq: Two times a day (BID) | ORAL | Status: DC

## 2023-03-01 MED ORDER — ONDANSETRON HCL 4 MG PO TABS: 4.0000 mg | ORAL_TABLET | Freq: Four times a day (QID) | ORAL | Status: DC | PRN

## 2023-03-01 NOTE — ED Provider Notes (Signed)
[place] EMERGENCY DEPARTMENT AT [facility] Provider Note   CSN: [id number] Arrival date & time: 03/01/23  1400     History  Chief Complaint  Patient presents with   Shortness of Breath        Chest Pain    Jesse Morris is a 42 y.o. male.  42 year old male presenting emergency department for shortness of breath.  Reports shortness of breath intermittently over the past several months.  Seemingly worsening over the past several days.  When awoken this morning from sleep with chest pain rating to his left arm.  Currently complaining of mild shortness of breath at rest.  No significant worsening dyspnea with edema walk roughly half the distance that he could a week and a half ago before getting winded.  He is unable to lie flat.  Denies lower extremity edema.   Shortness of Breath Associated symptoms: chest pain   Chest Pain Associated symptoms: shortness of breath        Home Medications Prior to Admission medications   Medication Sig Start Date End Date Taking? Authorizing Provider  losartan (COZAAR) 100 MG tablet Take 1 tablet (100 mg total) by mouth daily. 10/21/21  Yes Noralee Stain, DO  atorvastatin (LIPITOR) 40 MG tablet Take 1 tablet (40 mg total) by mouth daily. Patient not taking: Reported on 03/01/2023 10/22/21   Noralee Stain, DO  carvedilol (COREG) 12.5 MG tablet Take 1 tablet (12.5 mg total) by mouth 2 (two) times daily. Patient not taking: Reported on 03/01/2023 08/10/22 03/01/23  Yates Decamp, MD  traMADol (ULTRAM) 50 MG tablet Take 1 tablet (50 mg total) by mouth as directed. 1-2 tab at night as needed Patient not taking: Reported on 03/01/2023 08/10/22   Yates Decamp, MD      Allergies    Patient has no known allergies.    Review of Systems   Review of Systems  Respiratory:  Positive for shortness of breath.   Cardiovascular:  Positive for chest pain.    Physical Exam Updated Vital Signs BP (!) 166/109 (BP Location: Left Arm)   Pulse 95    Temp 98.1 F (36.7 C) (Oral)   Resp (!) 21   Ht 6' (1.829 m)   Wt (!) 147.4 kg   SpO2 96%   BMI 44.08 kg/m  Physical Exam Vitals and nursing note reviewed.  Constitutional:      General: He is not in acute distress.    Appearance: He is obese. He is not ill-appearing.  Cardiovascular:     Rate and Rhythm: Normal rate and regular rhythm.     Heart sounds: Normal heart sounds.  Pulmonary:     Effort: Pulmonary effort is normal.     Comments: Speaking in short sentences.  Maintaining oxygen saturation on room air in the upper 90s.  Crackles noted in the bases Abdominal:     Palpations: Abdomen is soft.  Musculoskeletal:     Cervical back: Normal range of motion.     Right lower leg: No edema.     Left lower leg: No edema.  Skin:    General: Skin is warm and dry.     Capillary Refill: Capillary refill takes less than 2 seconds.  Neurological:     Mental Status: He is alert and oriented to person, place, and time.  Psychiatric:        Mood and Affect: Mood normal.        Behavior: Behavior normal.     ED Results /  Procedures / Treatments   Labs (all labs ordered are listed, but only abnormal results are displayed) Labs Reviewed  BASIC METABOLIC PANEL - Abnormal; Notable for the following components:      Result Value   Potassium 3.4 (*)    Glucose, Bld 101 (*)    All other components within normal limits  BRAIN NATRIURETIC PEPTIDE - Abnormal; Notable for the following components:   B Natriuretic Peptide 173.0 (*)    All other components within normal limits  TROPONIN I (HIGH SENSITIVITY) - Abnormal; Notable for the following components:   Troponin I (High Sensitivity) 150 (*)    All other components within normal limits  TROPONIN I (HIGH SENSITIVITY) - Abnormal; Notable for the following components:   Troponin I (High Sensitivity) 150 (*)    All other components within normal limits  CBC  BASIC METABOLIC PANEL    EKG EKG Interpretation Date/Time:  Friday  March 01 2023 14:30:22 EST Ventricular Rate:  93 PR Interval:  135 QRS Duration:  100 QT Interval:  373 QTC Calculation: 464 R Axis:   42  Text Interpretation: Sinus rhythm Probable left atrial enlargement Borderline repolarization abnormality Confirmed by Estanislado Pandy [phone]) on 03/01/2023 4:23:44 PM  Radiology DG Chest 2 View Result Date: 03/01/2023 CLINICAL DATA:  Shortness of breath.  History of CHF. EXAM: CHEST - 2 VIEW COMPARISON:  10/20/2021 FINDINGS: Midline trachea. Mild cardiomegaly. Mediastinal contours otherwise within normal limits. No pleural effusion or pneumothorax. Clear lungs. No congestive failure. IMPRESSION: Cardiomegaly without congestive failure. Electronically Signed   By: Jeronimo Greaves M.D.   On: 03/01/2023 16:15    Procedures Procedures    Medications Ordered in ED Medications  losartan (COZAAR) tablet 100 mg (has no administration in time range)  enoxaparin (LOVENOX) injection 70 mg (has no administration in time range)  acetaminophen (TYLENOL) tablet 650 mg (has no administration in time range)    Or  acetaminophen (TYLENOL) suppository 650 mg (has no administration in time range)  oxyCODONE (Oxy IR/ROXICODONE) immediate release tablet 5 mg (has no administration in time range)  ondansetron (ZOFRAN) tablet 4 mg (has no administration in time range)    Or  ondansetron (ZOFRAN) injection 4 mg (has no administration in time range)  carvedilol (COREG) tablet 3.125 mg (has no administration in time range)  hydrALAZINE (APRESOLINE) injection 10 mg (10 mg Intravenous Given 03/01/23 1850)  hydrALAZINE (APRESOLINE) injection 10 mg (10 mg Intravenous Given 03/01/23 2253)  losartan (COZAAR) tablet 100 mg (100 mg Oral Given 03/01/23 2252)  furosemide (LASIX) injection 40 mg (40 mg Intravenous Given 03/01/23 2320)  potassium chloride (KLOR-CON) packet 40 mEq (40 mEq Oral Given 03/01/23 2320)    ED Course/ Medical Decision Making/ A&P Clinical Course as of  03/01/23 2345  Fri Mar 01, 2023  1636 DG Chest 2 View IMPRESSION: Cardiomegaly without congestive failure   [TY]  1639 Admitted 10/20/21 for [facility] thought to be 2/2 HTN urgency/emergency.   Per d/c note PMH of "42 year old male with past medical history obstructive sleep apnea on CPAP, hypertension, hyperlipidemia, obesity, mild pulmonary hypertension (mildly elelvated LVEDP via cath 04/2021), coronary artery disease (Cath 04/2021 wtih very mild 15% lesions of the mid LAD and dist LAD), and dilated cardiomyopathy (Echo 04/2021 EF 45-50% with global hypokinesis, mildly dilated LV)" [TY]  1640 Echo  [TY]  1640 Echo 10/21/21: 1. Left ventricular ejection fraction, by estimation, is 45 to 50%. The  left ventricle has mildly decreased function. The left ventricle  demonstrates global hypokinesis.  There is mild concentric left ventricular  hypertrophy. Left ventricular diastolic  parameters are consistent with Grade I diastolic dysfunction (impaired  relaxation).   2. Right ventricular systolic function is normal. The right ventricular  size is normal.   3. The mitral valve is normal in structure. No evidence of mitral valve  regurgitation.   4. The aortic valve is normal in structure. Aortic valve regurgitation is  not visualized.   5. Aortic dilatation noted. There is borderline dilatation of the  ascending aorta.   6. The inferior vena cava is normal in size with <50% respiratory  variability, suggesting right atrial pressure of 8 mmHg.    [TY]  1842 BP(!): 193/133 Will give hydralazine [TY]  2053 Spoke with Dr. Derrell Lolling with cardiology, does not think elevated troponin is secondary to ACS based on history and most recent cath.  Recommending admission for blood pressure control.  States is not unreasonable to try Lasix.  Will dose.  Simile responded to LOC.  Will also give home dose of losartan [TY]  2345 Case discussed with hospitalist who will admit patient.  [TY]    Clinical Course User  Index [TY] Coral Spikes, DO                                 Medical Decision Making 41 year old male with history of CHF hypertension obesity sleep apnea presenting emergency department for worsening shortness of breath.  He is afebrile nontachycardic initial SpO2 of 93%.  Maintaining oxygen saturation on my exam 96%, but speaking in short sentences.  He is hypertensive 170/97.  He does not appear to be in overt respiratory distress, but is speaking in short sentences and does have some adventitious lung sounds in the bases.  Chest x-ray without overt pulmonary edema, does appear to have some vascular congestion on my independent interpretation.  EKG similar to prior without ST segment changes to indicate ischemia.  Waiting on final lab results.  See ED course for further MDM and final disposition.  Amount and/or Complexity of Data Reviewed Independent Historian:     Details: Spouse notes the patient is only taking blood pressure medications. External Data Reviewed:     Details: See ED course Labs: ordered. Decision-making details documented in ED Course. Radiology: ordered. Decision-making details documented in ED Course.  Risk Prescription drug management. Decision regarding hospitalization. Diagnosis or treatment significantly limited by social determinants of health. Risk Details: Poor health literacy;         Final Clinical Impression(s) / ED Diagnoses Final diagnoses:  Hypertensive urgency    Rx / DC Orders ED Discharge Orders     None         Coral Spikes, DO 03/01/23 2345

## 2023-03-01 NOTE — ED Notes (Signed)
Still unable to see result of first troponin, lab called to notify

## 2023-03-01 NOTE — ED Notes (Signed)
ED TO INPATIENT HANDOFF REPORT  ED Nurse Name and Phone #: Rebecca Eaton RN  S Name/Age/Gender Jesse Morris 42 y.o. male Room/Bed: WA15/WA15  Code Status   Code Status: Full Code  Home/SNF/Other Home Patient oriented to: self, place, time, and situation Is this baseline? Yes   Triage Complete: Triage complete  Chief Complaint Hypertensive urgency [I16.0]  Triage Note Pt has hx of congestive heart failure. Pt states he has been feeling SOB when he is relaxing. Pt has been monitoring his oxygen saturation and has seen it drop to the low 80's. Pt states that he is unable to sleep because of the feeling of being unable to breath. Pt states that he does have a C-pap machine but has not been using it due to it being in storage.    Allergies No Known Allergies  Level of Care/Admitting Diagnosis ED Disposition     ED Disposition  Admit   Condition  --   Comment  Hospital Area: [facility] [[id number]]  Level of Care: Telemetry [5]  Admit to tele based on following criteria: Acute CHF  May admit patient to [facility] or [facility] if equivalent level of care is available:: No  Covid Evaluation: Asymptomatic - no recent exposure (last 10 days) testing not required  Diagnosis: Hypertensive urgency [161096]  Admitting Physician: Alan Mulder [[id number]]  Attending Physician: Alan Mulder [[id number]]  Certification:: I certify this patient will need inpatient services for at least 2 midnights          B Medical/Surgery History Past Medical History:  Diagnosis Date   CHF (congestive heart failure) (HCC)    Hypertension    Obesity    Sleep apnea    Past Surgical History:  Procedure Laterality Date   HAND TENDON SURGERY     RIGHT/LEFT HEART CATH AND CORONARY ANGIOGRAPHY N/A 04/17/2021   Procedure: RIGHT/LEFT HEART CATH AND CORONARY ANGIOGRAPHY;  Surgeon: Laurey Morale, MD;  Location: [facility] INVASIVE CV LAB;  Service: Cardiovascular;  Laterality: N/A;      A IV Location/Drains/Wounds Patient Lines/Drains/Airways Status     Active Line/Drains/Airways     Name Placement date Placement time Site Days   Peripheral IV 03/01/23 20 G Right Antecubital 03/01/23  1700  Antecubital  less than 1            Intake/Output Last 24 hours No intake or output data in the 24 hours ending 03/01/23 2217  Labs/Imaging Results for orders placed or performed during the hospital encounter of 03/01/23 (from the past 48 hours)  Basic metabolic panel     Status: Abnormal   Collection Time: 03/01/23  2:44 PM  Result Value Ref Range   Sodium 137 135 - 145 mmol/L   Potassium 3.4 (L) 3.5 - 5.1 mmol/L   Chloride 98 98 - 111 mmol/L   CO2 26 22 - 32 mmol/L   Glucose, Bld 101 (H) 70 - 99 mg/dL    Comment: Glucose reference range applies only to samples taken after fasting for at least 8 hours.   BUN 16 6 - 20 mg/dL   Creatinine, Ser 2.95 0.61 - 1.24 mg/dL   Calcium 9.1 8.9 - 62.1 mg/dL   GFR, Estimated >30 >86 mL/min    Comment: (NOTE) Calculated using the CKD-EPI Creatinine Equation (2021)    Anion gap 13 5 - 15    Comment: Performed at [facility], [address], [place], [place] [address]  CBC     Status:  None   Collection Time: 03/01/23  2:44 PM  Result Value Ref Range   WBC 5.2 4.0 - 10.5 K/uL   RBC 4.73 4.22 - 5.81 MIL/uL   Hemoglobin 13.8 13.0 - 17.0 g/dL   HCT 91.4 78.2 - 95.6 %   MCV 92.2 80.0 - 100.0 fL   MCH 29.2 26.0 - 34.0 pg   MCHC 31.7 30.0 - 36.0 g/dL   RDW 21.3 08.6 - 57.8 %   Platelets 300 150 - 400 K/uL   nRBC 0.0 0.0 - 0.2 %    Comment: Performed at [facility], [address], [place], [place] [address]  Troponin I (High Sensitivity)     Status: Abnormal   Collection Time: 03/01/23  2:44 PM  Result Value Ref Range   Troponin I (High Sensitivity) 150 (HH) <18 ng/L    Comment: CALLED TO [facility] 03/01/23 BY TUCKER,J  (NOTE) Elevated high sensitivity troponin I (hsTnI)  values and significant  changes across serial measurements may suggest ACS but many other  chronic and acute conditions are known to elevate hsTnI results.  Refer to the Links section for chest pain algorithms and additional  guidance. Performed at [facility], [address] [place], [place] [address]   Brain natriuretic peptide     Status: Abnormal   Collection Time: 03/01/23  2:44 PM  Result Value Ref Range   B Natriuretic Peptide 173.0 (H) 0.0 - 100.0 pg/mL    Comment: Performed at [facility], [address], [place], [place] [address]  Troponin I (High Sensitivity)     Status: Abnormal   Collection Time: 03/01/23  5:01 PM  Result Value Ref Range   Troponin I (High Sensitivity) 150 (HH) <18 ng/L    Comment: CRITICAL RESULT CALLED TO, READ BACK BY AND VERIFIED WITH Dale Morton RN AT 1823 03/01/23 TUCKER, J (NOTE) Elevated high sensitivity troponin I (hsTnI) values and significant  changes across serial measurements may suggest ACS but many other  chronic and acute conditions are known to elevate hsTnI results.  Refer to the "Links" section for chest pain algorithms and additional  guidance. Performed at [facility], [address] [place], [place] [address]    DG Chest 2 View Result Date: 03/01/2023 CLINICAL DATA:  Shortness of breath.  History of CHF. EXAM: CHEST - 2 VIEW COMPARISON:  10/20/2021 FINDINGS: Midline trachea. Mild cardiomegaly. Mediastinal contours otherwise within normal limits. No pleural effusion or pneumothorax. Clear lungs. No congestive failure. IMPRESSION: Cardiomegaly without congestive failure. Electronically Signed   By: Jeronimo Greaves M.D.   On: 03/01/2023 16:15    Pending Labs Unresulted Labs (From admission, onward)     Start     Ordered   03/02/23 0500  Basic metabolic panel  Tomorrow morning,   R        03/01/23 2208            Vitals/Pain Today's Vitals   03/01/23 1700  03/01/23 1730 03/01/23 1835 03/01/23 1900  BP: (!) 164/103 (!) 155/98 (!) 193/133 (!) 170/118  Pulse: 78 76 69 88  Resp: 19 18 16 18   Temp:   98.2 F (36.8 C)   TempSrc:   Oral   SpO2: 96% 92% 92% 96%  Weight:      Height:      PainSc:        Isolation Precautions No active isolations  Medications Medications  hydrALAZINE (APRESOLINE) injection 10 mg (has no  administration in time range)  losartan (COZAAR) tablet 100 mg (has no administration in time range)  losartan (COZAAR) tablet 100 mg (has no administration in time range)  enoxaparin (LOVENOX) injection 70 mg (has no administration in time range)  acetaminophen (TYLENOL) tablet 650 mg (has no administration in time range)    Or  acetaminophen (TYLENOL) suppository 650 mg (has no administration in time range)  oxyCODONE (Oxy IR/ROXICODONE) immediate release tablet 5 mg (has no administration in time range)  ondansetron (ZOFRAN) tablet 4 mg (has no administration in time range)    Or  ondansetron (ZOFRAN) injection 4 mg (has no administration in time range)  furosemide (LASIX) injection 40 mg (has no administration in time range)  potassium chloride (KLOR-CON) packet 40 mEq (has no administration in time range)  hydrALAZINE (APRESOLINE) injection 10 mg (10 mg Intravenous Given 03/01/23 1850)    Mobility walks     Focused Assessments     R Recommendations: See Admitting Provider Note  Report given to:   Additional Notes:

## 2023-03-01 NOTE — ED Provider Triage Note (Signed)
Emergency Medicine Provider Triage Evaluation Note  Jesse Morris , a 42 y.o. male  was evaluated in triage.  Pt complains of sob. Hx of CHF and report increased SOB with resting and with exertion ongoing for 2-3 months.  Used CPAP but haven't used it for the past month due to moving.  No fever, productive cough, increase weight gain.  Endorse reoccuring chest pain radiates to L arm.  Sts his O2 was in the 80s this AM  Review of Systems  Positive: As above Negative: As above  Physical Exam  BP (!) 170/97 (BP Location: Left Arm)   Pulse 85   Temp 98.5 F (36.9 C) (Oral)   Resp 18   Ht 6' (1.829 m)   Wt (!) 147.4 kg   SpO2 93%   BMI 44.08 kg/m  Gen:   Awake, no distress   Resp:  Normal effort  MSK:   Moves extremities without difficulty  Other:    Medical Decision Making  Medically screening exam initiated at 2:46 PM.  Appropriate orders placed.  Marqui Dehaven was informed that the remainder of the evaluation will be completed by another provider, this initial triage assessment does not replace that evaluation, and the importance of remaining in the ED until their evaluation is complete.     Fayrene Helper, PA-C 03/01/23 [id number]

## 2023-03-01 NOTE — ED Notes (Signed)
Critical troponin was called to triage RN. Troponin result not showing in chart. Lab called regarding this. Lab states they will release result. Dr. Maple Hudson updated

## 2023-03-01 NOTE — ED Triage Notes (Signed)
Pt has hx of congestive heart failure. Pt states he has been feeling SOB when he is relaxing. Pt has been monitoring his oxygen saturation and has seen it drop to the low 80's. Pt states that he is unable to sleep because of the feeling of being unable to breath. Pt states that he does have a C-pap machine but has not been using it due to it being in storage.

## 2023-03-01 NOTE — H&P (Signed)
History and Physical    Jesse Morris XBJ:[id number] DOB: Jun 11, 1980 DOA: 03/01/2023  PCP: Norm Salt, PA   Chief Complaint: sob  HPI: Jesse Morris is a 42 y.o. male with medical history significant of heart failure with reduced ejection fraction, hypertension, obesity who presented to the emergency department due to shortness of breath and chest discomfort.  Patient is been having ongoing worsening shortness of breath over the last several months that is progressively worsened over the last several days.  He woke up this morning felt short of breath with some pressure in his chest.  He is also been unable to walk distances or lay flat without developing dyspnea.  He presented to the ER where he was found to be hypertensive with systolics in 180s and hemodynamically stable.  Labs were obtained which demonstrated sodium 137, potassium 3.4, creatinine 1.07, troponin 150, 150, CBC unrevealing.  BNP 173.  Patient underwent chest x-ray which demonstrated cardiomegaly.  Of note patient's last echocardiogram was performed over a year ago which demonstrated ejection fraction 45 to 50%.  Patient was given IV Lasix admitted for further workup.  On evaluation patient was found to have orthopnea and paroxysmal nocturnal dyspnea.  Review of Systems: Review of Systems  Constitutional:  Negative for chills and fever.  HENT: Negative.    Eyes: Negative.   Respiratory:  Positive for shortness of breath. Negative for cough.   Cardiovascular:  Positive for orthopnea.  Gastrointestinal: Negative.  Negative for heartburn and nausea.  Genitourinary: Negative.   Musculoskeletal: Negative.   Skin: Negative.   Neurological: Negative.   Endo/Heme/Allergies: Negative.   Psychiatric/Behavioral: Negative.       As per HPI otherwise 10 point review of systems negative.   No Known Allergies  Past Medical History:  Diagnosis Date   CHF (congestive heart failure) (HCC)    Hypertension    Obesity    Sleep  apnea     Past Surgical History:  Procedure Laterality Date   HAND TENDON SURGERY     RIGHT/LEFT HEART CATH AND CORONARY ANGIOGRAPHY N/A 04/17/2021   Procedure: RIGHT/LEFT HEART CATH AND CORONARY ANGIOGRAPHY;  Surgeon: Laurey Morale, MD;  Location: [facility] INVASIVE CV LAB;  Service: Cardiovascular;  Laterality: N/A;     reports that he quit smoking about 9 years ago. His smoking use included cigarettes. He started smoking about 24 years ago. He has a 3.8 pack-year smoking history. He has never used smokeless tobacco. He reports current alcohol use of about 2.0 standard drinks of alcohol per week. He reports current drug use. Drug: Marijuana.  History reviewed. No pertinent family history.  Prior to Admission medications   Medication Sig Start Date End Date Taking? Authorizing Provider  losartan (COZAAR) 100 MG tablet Take 1 tablet (100 mg total) by mouth daily. 10/21/21  Yes Noralee Stain, DO  atorvastatin (LIPITOR) 40 MG tablet Take 1 tablet (40 mg total) by mouth daily. Patient not taking: Reported on 03/01/2023 10/22/21   Noralee Stain, DO  carvedilol (COREG) 12.5 MG tablet Take 1 tablet (12.5 mg total) by mouth 2 (two) times daily. Patient not taking: Reported on 03/01/2023 08/10/22 03/01/23  Yates Decamp, MD  traMADol (ULTRAM) 50 MG tablet Take 1 tablet (50 mg total) by mouth as directed. 1-2 tab at night as needed Patient not taking: Reported on 03/01/2023 08/10/22   Yates Decamp, MD    Physical Exam: Vitals:   03/01/23 1700 03/01/23 1730 03/01/23 1835 03/01/23 1900  BP: (!) 164/103 (!) 155/98 Marland Kitchen)  193/133 (!) 170/118  Pulse: 78 76 69 88  Resp: 19 18 16 18   Temp:   98.2 F (36.8 C)   TempSrc:   Oral   SpO2: 96% 92% 92% 96%  Weight:      Height:       Physical Exam Vitals reviewed.  Constitutional:      Appearance: He is normal weight.  Cardiovascular:     Rate and Rhythm: Normal rate and regular rhythm.  Pulmonary:     Effort: Pulmonary effort is normal.  Abdominal:      Palpations: Abdomen is soft.  Musculoskeletal:        General: Normal range of motion.     Cervical back: Normal range of motion.  Skin:    General: Skin is warm.     Capillary Refill: Capillary refill takes less than 2 seconds.  Neurological:     General: No focal deficit present.     Mental Status: He is alert.        Labs on Admission: I have personally reviewed the patients's labs and imaging studies.  Assessment/Plan Principal Problem:   Hypertensive urgency   #Acute on chronic heart failure reduced ejection fraction exacerbation, POA, active # Hypertensive urgency - Patient found to have elevated BNP with pulm venous congestion and volume overload - Orthopnea and paroxysmal nocturnal dyspnea - Last echocardiogram demonstrated reduced EF - Patient on losartan at home  Plan: Diuresis with IV Lasix 40 mg Resume home losartan Order TTE Lasix 40mg  Continue losartan, initiate coreg 3.125mg  BID  # Hypokalemia-replete with potassium  #Elevated trop- likely related to demand ischemia to setting of hypertension     Admission status: Inpatient Telemetry  Certification: The appropriate patient status for this patient is INPATIENT. Inpatient status is judged to be reasonable and necessary in order to provide the required intensity of service to ensure the patient's safety. The patient's presenting symptoms, physical exam findings, and initial radiographic and laboratory data in the context of their chronic comorbidities is felt to place them at high risk for further clinical deterioration. Furthermore, it is not anticipated that the patient will be medically stable for discharge from the hospital within 2 midnights of admission.   * I certify that at the point of admission it is my clinical judgment that the patient will require inpatient hospital care spanning beyond 2 midnights from the point of admission due to high intensity of service, high risk for further deterioration  and high frequency of surveillance required.Alan Mulder MD [facility] If 7PM-7AM, please contact night-coverage [email or web address]  03/01/2023, 10:09 PM

## 2023-03-02 ENCOUNTER — Inpatient Hospital Stay (HOSPITAL_COMMUNITY): Payer: Medicaid Other

## 2023-03-02 DIAGNOSIS — I160 Hypertensive urgency: Secondary | ICD-10-CM | POA: Diagnosis not present

## 2023-03-02 DIAGNOSIS — I509 Heart failure, unspecified: Secondary | ICD-10-CM | POA: Diagnosis not present

## 2023-03-02 LAB — ECHOCARDIOGRAM COMPLETE
AR max vel: 2.63 cm2
AV Area VTI: 3.39 cm2
AV Area mean vel: 2.32 cm2
AV Mean grad: 3 mm[Hg]
AV Peak grad: 5.1 mm[Hg]
Ao pk vel: 1.13 m/s
Area-P 1/2: 4.06 cm2
Height: 72 in
S' Lateral: 4 cm
Single Plane A4C EF: 49.5 %
Weight: 5200 [oz_av]

## 2023-03-02 LAB — BASIC METABOLIC PANEL
Anion gap: 9 (ref 5–15)
BUN: 14 mg/dL (ref 6–20)
CO2: 28 mmol/L (ref 22–32)
Calcium: 9.2 mg/dL (ref 8.9–10.3)
Chloride: 101 mmol/L (ref 98–111)
Creatinine, Ser: 0.96 mg/dL (ref 0.61–1.24)
GFR, Estimated: >60 mL/min (ref >60)
Glucose, Bld: 104 mg/dL — ABNORMAL HIGH (ref 70–99)
Potassium: 3.5 mmol/L (ref 3.5–5.1)
Sodium: 138 mmol/L (ref 135–145)

## 2023-03-02 MED ORDER — FUROSEMIDE 10 MG/ML IJ SOLN
40.0000 mg | Freq: Every day | INTRAMUSCULAR | Status: DC
  Administered 2023-03-02: 40 mg via INTRAVENOUS
  Filled 2023-03-02: qty 4

## 2023-03-02 MED ORDER — POTASSIUM CHLORIDE CRYS ER 20 MEQ PO TBCR
40.0000 meq | EXTENDED_RELEASE_TABLET | Freq: Once | ORAL | Status: AC
  Administered 2023-03-02: 40 meq via ORAL
  Filled 2023-03-02: qty 2

## 2023-03-02 MED ORDER — ZOLPIDEM TARTRATE 5 MG PO TABS
5.0000 mg | ORAL_TABLET | Freq: Once | ORAL | Status: AC
  Administered 2023-03-02: 5 mg via ORAL
  Filled 2023-03-02: qty 1

## 2023-03-02 MED ORDER — CARVEDILOL 6.25 MG PO TABS
6.2500 mg | ORAL_TABLET | Freq: Two times a day (BID) | ORAL | Status: DC
  Administered 2023-03-02 (×2): 6.25 mg via ORAL
  Filled 2023-03-02 (×2): qty 1

## 2023-03-02 MED ORDER — PERFLUTREN LIPID MICROSPHERE
1.0000 mL | INTRAVENOUS | Status: AC | PRN
Start: 2023-03-02 — End: 2023-03-02
  Administered 2023-03-02: 4 mL via INTRAVENOUS

## 2023-03-02 NOTE — Progress Notes (Signed)
PROGRESS NOTE    Jesse Morris  QIO:[id number] DOB: 25-Feb-1981 DOA: 03/01/2023 PCP: Norm Salt, PA   Brief Narrative: 42 year old with past medical history significant for heart failure with reduced ejection fraction, hypertension, obesity who presents to the emergency department with shortness of breath, chest discomfort.  He reports shortness of breath over the last several months progressive.  He has been unable to walk distances or lay flat without developing dyspnea.  Evaluation in the ED he was found to be hypertensive systolic blood pressure in the 180s.  He was found to have elevation of troponins.  Chest x-ray demonstrated cardiomegaly.  He had a prior echo year ago showed ejection fraction 45 to 50%.   Assessment & Plan:   Principal Problem:   Hypertensive urgency  1-Acute on Chronic systolic heart failure exacerbation: -Patient had a heart cath 2023 which showed minimal CAD, was diagnosed with nonischemic cardiomyopathy. -Follow 2D echo -Continue IV Lasix -Resume Coreg and losartan  Hypertensive urgency: -Resume Cozaar carvedilol  Hypokalemia: -Replenish  Elevation of troponin in the setting of demand ischemia in the setting of hypertension -Follow echo -Had normal cath 2023  Class 3 obesity:  Needs life style modifications.   OSA: Resume CPAP  Estimated body mass index is 44.08 kg/m as calculated from the following:   Height as of this encounter: 6' (1.829 m).   Weight as of this encounter: 147.4 kg.   DVT prophylaxis: Lovenox Code Status: Full code Family Communication: Discussed with patient Disposition Plan:  Status is: Inpatient Remains inpatient appropriate because: Agement of heart failure exacerbation    Consultants:   Procedures:  ECHO  Antimicrobials:    Subjective: He report breathing better.  He uses CPAP, but has not  been using his CPAP because it is at storage.    Objective: Vitals:   03/01/23 2253 03/01/23 2310  03/02/23 0143 03/02/23 0428  BP: (!) 158/108 (!) 166/109 (!) 154/104 (!) 166/101  Pulse:  95 97 100  Resp:  (!) 21  (!) 22  Temp:  98.1 F (36.7 C)  98.4 F (36.9 C)  TempSrc:  Oral  Oral  SpO2:    99%  Weight:      Height:        Intake/Output Summary (Last 24 hours) at 03/02/2023 0731 Last data filed at 03/02/2023 0147 Gross per 24 hour  Intake --  Output 1600 ml  Net -1600 ml   Filed Weights   03/01/23 1442  Weight: (!) 147.4 kg    Examination:  General exam: Appears calm and comfortable  Respiratory system: BL crackles. Marland Kitchen Respiratory effort normal. Cardiovascular system: S1 & S2 heard, RRR. No JVD, murmurs, rubs, gallops or clicks. No pedal edema. Gastrointestinal system: Abdomen is nondistended, soft and nontender. No organomegaly or masses felt. Normal bowel sounds heard. Central nervous system: Alert and oriented. No focal neurological deficits. Extremities: trace edema Data Reviewed: I have personally reviewed following labs and imaging studies  CBC: Recent Labs  Lab 03/01/23 1444  WBC 5.2  HGB 13.8  HCT 43.6  MCV 92.2  PLT 300   Basic Metabolic Panel: Recent Labs  Lab 03/01/23 1444 03/02/23 0542  NA 137 138  K 3.4* 3.5  CL 98 101  CO2 26 28  GLUCOSE 101* 104*  BUN 16 14  CREATININE 1.07 0.96  CALCIUM 9.1 9.2   GFR: Estimated Creatinine Clearance: 149.6 mL/min (by C-G formula based on SCr of 0.96 mg/dL). Liver Function Tests: No results for input(s): "AST", "ALT", "  ALKPHOS", "BILITOT", "PROT", "ALBUMIN" in the last 168 hours. No results for input(s): "LIPASE", "AMYLASE" in the last 168 hours. No results for input(s): "AMMONIA" in the last 168 hours. Coagulation Profile: No results for input(s): "INR", "PROTIME" in the last 168 hours. Cardiac Enzymes: No results for input(s): "CKTOTAL", "CKMB", "CKMBINDEX", "TROPONINI" in the last 168 hours. BNP (last 3 results) No results for input(s): "PROBNP" in the last 8760 hours. HbA1C: No results  for input(s): "HGBA1C" in the last 72 hours. CBG: No results for input(s): "GLUCAP" in the last 168 hours. Lipid Profile: No results for input(s): "CHOL", "HDL", "LDLCALC", "TRIG", "CHOLHDL", "LDLDIRECT" in the last 72 hours. Thyroid Function Tests: No results for input(s): "TSH", "T4TOTAL", "FREET4", "T3FREE", "THYROIDAB" in the last 72 hours. Anemia Panel: No results for input(s): "VITAMINB12", "FOLATE", "FERRITIN", "TIBC", "IRON", "RETICCTPCT" in the last 72 hours. Sepsis Labs: No results for input(s): "PROCALCITON", "LATICACIDVEN" in the last 168 hours.  No results found for this or any previous visit (from the past 240 hours).       Radiology Studies: DG Chest 2 View Result Date: 03/01/2023 CLINICAL DATA:  Shortness of breath.  History of CHF. EXAM: CHEST - 2 VIEW COMPARISON:  10/20/2021 FINDINGS: Midline trachea. Mild cardiomegaly. Mediastinal contours otherwise within normal limits. No pleural effusion or pneumothorax. Clear lungs. No congestive failure. IMPRESSION: Cardiomegaly without congestive failure. Electronically Signed   By: Jeronimo Greaves M.D.   On: 03/01/2023 16:15        Scheduled Meds:  carvedilol  3.125 mg Oral BID WC   enoxaparin (LOVENOX) injection  70 mg Subcutaneous Q24H   losartan  100 mg Oral Daily   Continuous Infusions:   LOS: 1 day    Time spent: 35 minutes     Kohen Reither A Ephram Kornegay, MD [facility]   If 7PM-7AM, please contact night-coverage [email or web address]  03/02/2023, 7:31 AM

## 2023-03-02 NOTE — Progress Notes (Signed)
                                                  Against Medical Advice Patient at this time expresses desire to leave the Hospital immediately, patient has been warned that this is not Medically advisable at this time, and can result in Medical complications like Death and Disability, patient understands and accepts the risks involved and assumes full responsibilty of this decision.  This patient has also been advised that if they feel the need for further medical assistance to return to any available ER or dial [phone].  Informed by Nursing staff that this patient has left care and has signed the form  Against Medical Advice on 03/02/2023 at 2041 hrs.  Chinita Greenland BSN MSNA MSN ACNPC-AG Acute Care Nurse Practitioner [facility]

## 2023-03-02 NOTE — Plan of Care (Signed)

## 2023-03-02 NOTE — Progress Notes (Signed)
   03/02/23 2046  BiPAP/CPAP/SIPAP  Reason BIPAP/CPAP not in use Non-compliant;Other(comment) (Pt states he has not been able to use his home cpap for the past few months, he decline cpap tonight. Advised to let RN know if he changes his mind.)

## 2023-03-02 NOTE — Progress Notes (Signed)
  Echocardiogram 2D Echocardiogram has been performed.  Ocie Doyne RDCS 03/02/2023, 10:44 AM

## 2023-03-02 NOTE — Progress Notes (Signed)
Pt left AMA. AMA paper signed. Charge RN and Attending NP aware.

## 2023-03-02 NOTE — Plan of Care (Signed)
  Problem: Education: Goal: Knowledge of General Education information will improve Description: Including pain rating scale, medication(s)/side effects and non-pharmacologic comfort measures 03/02/2023 1716 by Almon Hercules, RN Outcome: Progressing 03/02/2023 1056 by Almon Hercules, RN Outcome: Progressing   Problem: Health Behavior/Discharge Planning: Goal: Ability to manage health-related needs will improve 03/02/2023 1716 by Almon Hercules, RN Outcome: Progressing 03/02/2023 1056 by Almon Hercules, RN Outcome: Progressing   Problem: Clinical Measurements: Goal: Ability to maintain clinical measurements within normal limits will improve 03/02/2023 1716 by Almon Hercules, RN Outcome: Progressing 03/02/2023 1056 by Almon Hercules, RN Outcome: Progressing   Problem: Clinical Measurements: Goal: Diagnostic test results will improve 03/02/2023 1716 by Almon Hercules, RN Outcome: Progressing 03/02/2023 1056 by Almon Hercules, RN Outcome: Progressing

## 2023-05-15 ENCOUNTER — Encounter (HOSPITAL_COMMUNITY): Payer: Self-pay | Admitting: Emergency Medicine

## 2023-05-15 ENCOUNTER — Inpatient Hospital Stay (HOSPITAL_COMMUNITY)
Admission: EM | Admit: 2023-05-15 | Discharge: 2023-05-18 | DRG: 291 | Disposition: A | Discharge status: Home / Self Care | Payer: MEDICAID | Attending: Family Medicine | Admitting: Family Medicine

## 2023-05-15 ENCOUNTER — Emergency Department (HOSPITAL_COMMUNITY): Payer: MEDICAID

## 2023-05-15 ENCOUNTER — Other Ambulatory Visit: Payer: Self-pay

## 2023-05-15 DIAGNOSIS — I420 Dilated cardiomyopathy: Secondary | ICD-10-CM | POA: Diagnosis present

## 2023-05-15 DIAGNOSIS — Z6841 Body Mass Index (BMI) 40.0 and over, adult: Secondary | ICD-10-CM

## 2023-05-15 DIAGNOSIS — G4733 Obstructive sleep apnea (adult) (pediatric): Secondary | ICD-10-CM

## 2023-05-15 DIAGNOSIS — G479 Sleep disorder, unspecified: Secondary | ICD-10-CM | POA: Diagnosis present

## 2023-05-15 DIAGNOSIS — Z87891 Personal history of nicotine dependence: Secondary | ICD-10-CM

## 2023-05-15 DIAGNOSIS — I161 Hypertensive emergency: Secondary | ICD-10-CM | POA: Diagnosis present

## 2023-05-15 DIAGNOSIS — E782 Mixed hyperlipidemia: Secondary | ICD-10-CM | POA: Diagnosis present

## 2023-05-15 DIAGNOSIS — R7989 Other specified abnormal findings of blood chemistry: Secondary | ICD-10-CM | POA: Diagnosis present

## 2023-05-15 DIAGNOSIS — E66813 Obesity, class 3: Secondary | ICD-10-CM | POA: Diagnosis present

## 2023-05-15 DIAGNOSIS — J9611 Chronic respiratory failure with hypoxia: Secondary | ICD-10-CM | POA: Diagnosis present

## 2023-05-15 DIAGNOSIS — J9601 Acute respiratory failure with hypoxia: Secondary | ICD-10-CM | POA: Diagnosis present

## 2023-05-15 DIAGNOSIS — I2489 Other forms of acute ischemic heart disease: Secondary | ICD-10-CM | POA: Diagnosis present

## 2023-05-15 DIAGNOSIS — D649 Anemia, unspecified: Secondary | ICD-10-CM | POA: Diagnosis present

## 2023-05-15 DIAGNOSIS — R0602 Shortness of breath: Principal | ICD-10-CM

## 2023-05-15 DIAGNOSIS — Z79899 Other long term (current) drug therapy: Secondary | ICD-10-CM

## 2023-05-15 DIAGNOSIS — I5023 Acute on chronic systolic (congestive) heart failure: Secondary | ICD-10-CM | POA: Diagnosis present

## 2023-05-15 DIAGNOSIS — Z91199 Patient's noncompliance with other medical treatment and regimen due to unspecified reason: Secondary | ICD-10-CM

## 2023-05-15 DIAGNOSIS — Z91148 Patient's other noncompliance with medication regimen for other reason: Secondary | ICD-10-CM

## 2023-05-15 DIAGNOSIS — M25512 Pain in left shoulder: Secondary | ICD-10-CM | POA: Diagnosis present

## 2023-05-15 DIAGNOSIS — R079 Chest pain, unspecified: Secondary | ICD-10-CM | POA: Diagnosis present

## 2023-05-15 DIAGNOSIS — I2510 Atherosclerotic heart disease of native coronary artery without angina pectoris: Secondary | ICD-10-CM | POA: Diagnosis present

## 2023-05-15 DIAGNOSIS — I5020 Unspecified systolic (congestive) heart failure: Secondary | ICD-10-CM | POA: Diagnosis present

## 2023-05-15 DIAGNOSIS — I110 Hypertensive heart disease with heart failure: Principal | ICD-10-CM | POA: Diagnosis present

## 2023-05-15 DIAGNOSIS — I272 Pulmonary hypertension, unspecified: Secondary | ICD-10-CM | POA: Diagnosis present

## 2023-05-15 LAB — CBC
HCT: 45 % (ref 39.0–52.0)
Hemoglobin: 13.5 g/dL (ref 13.0–17.0)
MCH: 29.2 pg (ref 26.0–34.0)
MCHC: 30 g/dL (ref 30.0–36.0)
MCV: 97.2 fL (ref 80.0–100.0)
Platelets: 270 10*3/uL (ref 150–400)
RBC: 4.63 MIL/uL (ref 4.22–5.81)
RDW: 15.9 % — ABNORMAL HIGH (ref 11.5–15.5)
WBC: 5.2 10*3/uL (ref 4.0–10.5)
nRBC: 0 % (ref 0.0–0.2)

## 2023-05-15 LAB — COMPREHENSIVE METABOLIC PANEL
ALT: 23 U/L (ref 0–44)
AST: 24 U/L (ref 15–41)
Albumin: 3.7 g/dL (ref 3.5–5.0)
Alkaline Phosphatase: 36 U/L — ABNORMAL LOW (ref 38–126)
Anion gap: 7 (ref 5–15)
BUN: 17 mg/dL (ref 6–20)
CO2: 29 mmol/L (ref 22–32)
Calcium: 8.5 mg/dL — ABNORMAL LOW (ref 8.9–10.3)
Chloride: 104 mmol/L (ref 98–111)
Creatinine, Ser: 1.02 mg/dL (ref 0.61–1.24)
GFR, Estimated: >60 mL/min (ref >60)
Glucose, Bld: 95 mg/dL (ref 70–99)
Potassium: 3.6 mmol/L (ref 3.5–5.1)
Sodium: 140 mmol/L (ref 135–145)
Total Bilirubin: 0.5 mg/dL (ref 0.0–1.2)
Total Protein: 6.9 g/dL (ref 6.5–8.1)

## 2023-05-15 LAB — TROPONIN I (HIGH SENSITIVITY): Troponin I (High Sensitivity): 128 ng/L (ref <18)

## 2023-05-15 LAB — BRAIN NATRIURETIC PEPTIDE: B Natriuretic Peptide: 49.1 pg/mL (ref 0.0–100.0)

## 2023-05-15 MED ORDER — ASPIRIN 325 MG PO TABS
325.0000 mg | ORAL_TABLET | Freq: Every day | ORAL | Status: DC
  Filled 2023-05-15: qty 1

## 2023-05-15 MED ORDER — ASPIRIN 325 MG PO TABS
325.0000 mg | ORAL_TABLET | Freq: Once | ORAL | Status: AC
  Administered 2023-05-15: 325 mg via ORAL

## 2023-05-15 NOTE — ED Provider Notes (Signed)
 [place] EMERGENCY DEPARTMENT AT [facility] Provider Note   CSN: [id number] Arrival date & time: 05/15/23  2146     History  Chief Complaint  Patient presents with   Shortness of Breath   Chest Pain   HPI Jesse Morris is a 43 y.o. male with history of hypertension, CHF presenting for shortness of breath and chest pain.  States his symptoms have been going on for couple months.  Noticeably worse in the last few days.  Chest pain is left-sided at times radiates to the left arm.  States the pain in the chest is sharp but the pain in the arm is more dull.  Shortness of breath is worse with lying flat and exertion.  Patient states that he does not take Lasix.  States he has recently been out of his losartan but did restart taking it over the weekend. Also endorses a cough but that is also been going on for couple months as well.  Denies fever.  Denies calf tenderness, recent long trips or recent immobilization.   Shortness of Breath Associated symptoms: chest pain   Chest Pain Associated symptoms: shortness of breath        Home Medications Prior to Admission medications   Medication Sig Start Date End Date Taking? Authorizing Provider  atorvastatin (LIPITOR) 40 MG tablet Take 1 tablet (40 mg total) by mouth daily. Patient not taking: Reported on 03/01/2023 10/22/21   Noralee Stain, DO  carvedilol (COREG) 12.5 MG tablet Take 1 tablet (12.5 mg total) by mouth 2 (two) times daily. Patient not taking: Reported on 03/01/2023 08/10/22 03/01/23  Yates Decamp, MD  losartan (COZAAR) 100 MG tablet Take 1 tablet (100 mg total) by mouth daily. 10/21/21   Noralee Stain, DO  traMADol (ULTRAM) 50 MG tablet Take 1 tablet (50 mg total) by mouth as directed. 1-2 tab at night as needed Patient not taking: Reported on 03/01/2023 08/10/22   Yates Decamp, MD      Allergies    Patient has no known allergies.    Review of Systems   Review of Systems  Respiratory:  Positive for shortness of  breath.   Cardiovascular:  Positive for chest pain.    Physical Exam Updated Vital Signs BP (!) 176/114   Pulse 90   Temp 98.1 F (36.7 C) (Oral)   Resp (!) 23   Wt (!) 150 kg   SpO2 95%   BMI 44.85 kg/m  Physical Exam Vitals and nursing note reviewed.  HENT:     Head: Normocephalic and atraumatic.     Mouth/Throat:     Mouth: Mucous membranes are moist.  Eyes:     General:        Right eye: No discharge.        Left eye: No discharge.     Conjunctiva/sclera: Conjunctivae normal.  Cardiovascular:     Rate and Rhythm: Normal rate and regular rhythm.     Pulses: Normal pulses.     Heart sounds: Normal heart sounds.  Pulmonary:     Effort: Pulmonary effort is normal. Tachypnea present.     Breath sounds: Examination of the right-lower field reveals decreased breath sounds. Examination of the left-lower field reveals decreased breath sounds. Decreased breath sounds present. No wheezing or rales.     Comments: Hypoxic to the high 80s on room air sitting in the bed.  Placed on 2 L of oxygen.  Sats did improve to the mid 90s. Abdominal:  General: Abdomen is flat.     Palpations: Abdomen is soft.  Skin:    General: Skin is warm and dry.  Neurological:     General: No focal deficit present.  Psychiatric:        Mood and Affect: Mood normal.     ED Results / Procedures / Treatments   Labs (all labs ordered are listed, but only abnormal results are displayed) Labs Reviewed  CBC - Abnormal; Notable for the following components:      Result Value   RDW 15.9 (*)    All other components within normal limits  COMPREHENSIVE METABOLIC PANEL - Abnormal; Notable for the following components:   Calcium 8.5 (*)    Alkaline Phosphatase 36 (*)    All other components within normal limits  TROPONIN I (HIGH SENSITIVITY) - Abnormal; Notable for the following components:   Troponin I (High Sensitivity) 128 (*)    All other components within normal limits  TROPONIN I (HIGH  SENSITIVITY) - Abnormal; Notable for the following components:   Troponin I (High Sensitivity) 127 (*)    All other components within normal limits  BRAIN NATRIURETIC PEPTIDE  D-DIMER, QUANTITATIVE  CBC WITH DIFFERENTIAL/PLATELET  COMPREHENSIVE METABOLIC PANEL  MAGNESIUM  PHOSPHORUS    EKG EKG Interpretation Date/Time:  Wednesday May 15 2023 21:56:36 EST Ventricular Rate:  90 PR Interval:  164 QRS Duration:  96 QT Interval:  381 QTC Calculation: 467 R Axis:   34  Text Interpretation: Sinus rhythm Left atrial enlargement Nonspecific T abnormalities, lateral leads No significant change since last tracing Confirmed by Gwyneth Sprout ([id number]) on 05/15/2023 11:16:06 PM  Radiology DG Chest 2 View Result Date: 05/15/2023 CLINICAL DATA:  Shortness of breath.  CHF. EXAM: CHEST - 2 VIEW COMPARISON:  03/01/2023 FINDINGS: Mild cardiomegaly.The cardiomediastinal contours are normal. The lungs are clear. Pulmonary vasculature is normal. No consolidation, pleural effusion, or pneumothorax. No acute osseous abnormalities are seen. IMPRESSION: Cardiomegaly. No findings of congestive failure or acute pulmonary process. Electronically Signed   By: Narda Rutherford M.D.   On: 05/15/2023 22:40    Procedures .Critical Care  Performed by: Gareth Eagle, PA-C Authorized by: Gareth Eagle, PA-C   Critical care provider statement:    Critical care time (minutes):  30   Critical care was necessary to treat or prevent imminent or life-threatening deterioration of the following conditions:  Respiratory failure (Hypertensive emergency requiring IV antihypertensives)   Critical care was time spent personally by me on the following activities:  Development of treatment plan with patient or surrogate, discussions with consultants, evaluation of patient's response to treatment, examination of patient, ordering and review of laboratory studies, ordering and review of radiographic studies, ordering and  performing treatments and interventions, pulse oximetry, re-evaluation of patient's condition and review of old charts     Medications Ordered in ED Medications  acetaminophen (TYLENOL) tablet 650 mg (has no administration in time range)    Or  acetaminophen (TYLENOL) suppository 650 mg (has no administration in time range)  melatonin tablet 3 mg (has no administration in time range)  ondansetron (ZOFRAN) injection 4 mg (has no administration in time range)  losartan (COZAAR) tablet 100 mg (has no administration in time range)  furosemide (LASIX) injection 40 mg (has no administration in time range)  hydrALAZINE (APRESOLINE) injection 10 mg (has no administration in time range)  aspirin tablet 325 mg (325 mg Oral Given 05/15/23 2328)  labetalol (NORMODYNE) injection 10 mg (10 mg Intravenous Given 05/16/23  [email or web address])    ED Course/ Medical Decision Making/ A&P Clinical Course as of 05/16/23 0207  Wed May 15, 2023  2301 [facility] Chest 2 View [JR]  Thu May 16, 2023  0108 Ambulated patient on room air and sats dropped into the mid 80s, patient was notably more tachypneic, stating chest pain was worse.  Placed on 2 L of oxygen. [JR]    Clinical Course User Index [JR] Gareth Eagle, PA-C                                 Medical Decision Making Amount and/or Complexity of Data Reviewed Labs: ordered. Radiology: ordered. Decision-making details documented in ED Course.  Risk OTC drugs. Prescription drug management. Decision regarding hospitalization.   Initial Impression and Ddx 43 year old well-appearing male presenting for chest pain and shortness of breath.  Exam notable for hypoxia, tachypnea and diminished breath sounds.  DDx includes CHF exacerbation, ACS, PE, pneumonia, hypertensive emergency, other. Patient PMH that increases complexity of ED encounter:  CHF  Interpretation of Diagnostics - I independent reviewed and interpreted the labs as followed: elevated troponins (no delta)  -  I independently visualized the following imaging with scope of interpretation limited to determining acute life threatening conditions related to emergency care: cxr, which revealed no acute cardiopulmonary process  -I personally reviewed interpret EKG which revealed sinus rhythm  Patient Reassessment and Ultimate Disposition/Management On reassessment, attempted to ambulate patient around the department on room air.  Patient was notably hypoxic in the mid to low 80s, more tachypneic and stating that he was short of breath and that his chest was hurting.  Placed him back on 2 L of oxygen.  Blood pressure remained persistently high.  Started him on IV labetalol.  For now, held off on Lasix given that there was no lower extremity edema on exam and x-ray was reassuring.  Admitted to hospital service with Dr. Newton Pigg for CHF exacerbation versus hypertensive emergency.  Patient management required discussion with the following services or consulting groups:  Hospitalist Service  Complexity of Problems Addressed Acute complicated illness or Injury  Additional Data Reviewed and Analyzed Further history obtained from: Past medical history and medications listed in the EMR and Prior ED visit notes  Patient Encounter Risk Assessment Consideration of hospitalization         Final Clinical Impression(s) / ED Diagnoses Final diagnoses:  Shortness of breath  Chest pain, unspecified type    Rx / DC Orders ED Discharge Orders     None         Gareth Eagle, PA-C 05/16/23 0207    Gwyneth Sprout, MD 05/20/23 2320

## 2023-05-15 NOTE — ED Triage Notes (Signed)
 Patient endorses shob with exertion and when he lays flat. Patient also endorses sharp chest pain.  Patient has a history of CHF but does not take lasix.  Patient obviously shob in triage.

## 2023-05-16 ENCOUNTER — Encounter (HOSPITAL_COMMUNITY): Payer: Self-pay | Admitting: Internal Medicine

## 2023-05-16 DIAGNOSIS — Z6841 Body Mass Index (BMI) 40.0 and over, adult: Secondary | ICD-10-CM | POA: Diagnosis not present

## 2023-05-16 DIAGNOSIS — R079 Chest pain, unspecified: Secondary | ICD-10-CM | POA: Diagnosis present

## 2023-05-16 DIAGNOSIS — R7989 Other specified abnormal findings of blood chemistry: Secondary | ICD-10-CM | POA: Diagnosis present

## 2023-05-16 DIAGNOSIS — I110 Hypertensive heart disease with heart failure: Secondary | ICD-10-CM | POA: Diagnosis present

## 2023-05-16 DIAGNOSIS — I10 Essential (primary) hypertension: Secondary | ICD-10-CM | POA: Diagnosis not present

## 2023-05-16 DIAGNOSIS — G4733 Obstructive sleep apnea (adult) (pediatric): Secondary | ICD-10-CM | POA: Diagnosis present

## 2023-05-16 DIAGNOSIS — I2510 Atherosclerotic heart disease of native coronary artery without angina pectoris: Secondary | ICD-10-CM | POA: Diagnosis present

## 2023-05-16 DIAGNOSIS — Z87891 Personal history of nicotine dependence: Secondary | ICD-10-CM | POA: Diagnosis not present

## 2023-05-16 DIAGNOSIS — I2489 Other forms of acute ischemic heart disease: Secondary | ICD-10-CM | POA: Diagnosis present

## 2023-05-16 DIAGNOSIS — E66813 Obesity, class 3: Secondary | ICD-10-CM | POA: Diagnosis present

## 2023-05-16 DIAGNOSIS — M25512 Pain in left shoulder: Secondary | ICD-10-CM | POA: Diagnosis present

## 2023-05-16 DIAGNOSIS — I420 Dilated cardiomyopathy: Secondary | ICD-10-CM | POA: Diagnosis present

## 2023-05-16 DIAGNOSIS — Z91199 Patient's noncompliance with other medical treatment and regimen due to unspecified reason: Secondary | ICD-10-CM | POA: Diagnosis not present

## 2023-05-16 DIAGNOSIS — D649 Anemia, unspecified: Secondary | ICD-10-CM | POA: Diagnosis present

## 2023-05-16 DIAGNOSIS — J9601 Acute respiratory failure with hypoxia: Secondary | ICD-10-CM | POA: Diagnosis present

## 2023-05-16 DIAGNOSIS — I272 Pulmonary hypertension, unspecified: Secondary | ICD-10-CM | POA: Diagnosis present

## 2023-05-16 DIAGNOSIS — I5023 Acute on chronic systolic (congestive) heart failure: Secondary | ICD-10-CM | POA: Diagnosis present

## 2023-05-16 DIAGNOSIS — R0602 Shortness of breath: Secondary | ICD-10-CM | POA: Diagnosis present

## 2023-05-16 DIAGNOSIS — E782 Mixed hyperlipidemia: Secondary | ICD-10-CM | POA: Diagnosis present

## 2023-05-16 DIAGNOSIS — G479 Sleep disorder, unspecified: Secondary | ICD-10-CM | POA: Diagnosis present

## 2023-05-16 DIAGNOSIS — Z79899 Other long term (current) drug therapy: Secondary | ICD-10-CM | POA: Diagnosis not present

## 2023-05-16 DIAGNOSIS — Z91148 Patient's other noncompliance with medication regimen for other reason: Secondary | ICD-10-CM | POA: Diagnosis not present

## 2023-05-16 DIAGNOSIS — I5020 Unspecified systolic (congestive) heart failure: Secondary | ICD-10-CM | POA: Diagnosis present

## 2023-05-16 DIAGNOSIS — I161 Hypertensive emergency: Secondary | ICD-10-CM | POA: Diagnosis present

## 2023-05-16 LAB — CBC WITH DIFFERENTIAL/PLATELET
Abs Immature Granulocytes: 0.01 10*3/uL (ref 0.00–0.07)
Basophils Absolute: 0 10*3/uL (ref 0.0–0.1)
Basophils Relative: 1 %
Eosinophils Absolute: 0.3 10*3/uL (ref 0.0–0.5)
Eosinophils Relative: 5 %
HCT: 42.1 % (ref 39.0–52.0)
Hemoglobin: 12.8 g/dL — ABNORMAL LOW (ref 13.0–17.0)
Immature Granulocytes: 0 %
Lymphocytes Relative: 28 %
Lymphs Abs: 1.6 10*3/uL (ref 0.7–4.0)
MCH: 29.1 pg (ref 26.0–34.0)
MCHC: 30.4 g/dL (ref 30.0–36.0)
MCV: 95.7 fL (ref 80.0–100.0)
Monocytes Absolute: 0.5 10*3/uL (ref 0.1–1.0)
Monocytes Relative: 9 %
Neutro Abs: 3.4 10*3/uL (ref 1.7–7.7)
Neutrophils Relative %: 57 %
Platelets: 238 10*3/uL (ref 150–400)
RBC: 4.4 MIL/uL (ref 4.22–5.81)
RDW: 16 % — ABNORMAL HIGH (ref 11.5–15.5)
WBC Morphology: >10
WBC: 5.9 10*3/uL (ref 4.0–10.5)
nRBC: 0 % (ref 0.0–0.2)

## 2023-05-16 LAB — COMPREHENSIVE METABOLIC PANEL
ALT: 24 U/L (ref 0–44)
AST: 22 U/L (ref 15–41)
Albumin: 3.8 g/dL (ref 3.5–5.0)
Alkaline Phosphatase: 34 U/L — ABNORMAL LOW (ref 38–126)
Anion gap: 12 (ref 5–15)
BUN: 19 mg/dL (ref 6–20)
CO2: 31 mmol/L (ref 22–32)
Calcium: 9 mg/dL (ref 8.9–10.3)
Chloride: 99 mmol/L (ref 98–111)
Creatinine, Ser: 0.99 mg/dL (ref 0.61–1.24)
GFR, Estimated: >60 mL/min (ref >60)
Glucose, Bld: 103 mg/dL — ABNORMAL HIGH (ref 70–99)
Potassium: 3.5 mmol/L (ref 3.5–5.1)
Sodium: 142 mmol/L (ref 135–145)
Total Bilirubin: 0.7 mg/dL (ref 0.0–1.2)
Total Protein: 6.8 g/dL (ref 6.5–8.1)

## 2023-05-16 LAB — D-DIMER, QUANTITATIVE: D-Dimer, Quant: <0.27 ug{FEU}/mL (ref 0.00–0.50)

## 2023-05-16 LAB — TROPONIN I (HIGH SENSITIVITY): Troponin I (High Sensitivity): 127 ng/L (ref <18)

## 2023-05-16 LAB — MAGNESIUM: Magnesium: 2.2 mg/dL (ref 1.7–2.4)

## 2023-05-16 LAB — PHOSPHORUS: Phosphorus: 3.5 mg/dL (ref 2.5–4.6)

## 2023-05-16 MED ORDER — FUROSEMIDE 10 MG/ML IJ SOLN
40.0000 mg | Freq: Two times a day (BID) | INTRAMUSCULAR | Status: DC
  Administered 2023-05-16 – 2023-05-17 (×5): 40 mg via INTRAVENOUS
  Filled 2023-05-16 (×5): qty 4

## 2023-05-16 MED ORDER — LOSARTAN POTASSIUM 50 MG PO TABS
100.0000 mg | ORAL_TABLET | Freq: Every day | ORAL | Status: DC
  Administered 2023-05-16 – 2023-05-17 (×2): 100 mg via ORAL
  Filled 2023-05-16 (×2): qty 2

## 2023-05-16 MED ORDER — CARVEDILOL 6.25 MG PO TABS
6.2500 mg | ORAL_TABLET | Freq: Two times a day (BID) | ORAL | Status: DC
  Administered 2023-05-16 – 2023-05-18 (×4): 6.25 mg via ORAL
  Filled 2023-05-16: qty 1
  Filled 2023-05-16: qty 2
  Filled 2023-05-16 (×2): qty 1

## 2023-05-16 MED ORDER — HYDROXYZINE HCL 25 MG PO TABS
25.0000 mg | ORAL_TABLET | Freq: Once | ORAL | Status: AC
  Administered 2023-05-16: 25 mg via ORAL
  Filled 2023-05-16: qty 1

## 2023-05-16 MED ORDER — ONDANSETRON HCL 4 MG/2ML IJ SOLN: 4.0000 mg | Freq: Four times a day (QID) | INTRAMUSCULAR | Status: DC | PRN

## 2023-05-16 MED ORDER — MELATONIN 3 MG PO TABS
3.0000 mg | ORAL_TABLET | Freq: Every evening | ORAL | Status: DC | PRN
  Administered 2023-05-16 – 2023-05-17 (×2): 3 mg via ORAL
  Filled 2023-05-16 (×2): qty 1

## 2023-05-16 MED ORDER — HYDRALAZINE HCL 20 MG/ML IJ SOLN
20.0000 mg | Freq: Once | INTRAMUSCULAR | Status: AC
  Administered 2023-05-16: 20 mg via INTRAVENOUS
  Filled 2023-05-16: qty 1

## 2023-05-16 MED ORDER — ACETAMINOPHEN 325 MG PO TABS
650.0000 mg | ORAL_TABLET | Freq: Four times a day (QID) | ORAL | Status: DC | PRN
  Administered 2023-05-16 – 2023-05-17 (×2): 650 mg via ORAL
  Filled 2023-05-16 (×2): qty 2

## 2023-05-16 MED ORDER — HYDRALAZINE HCL 20 MG/ML IJ SOLN
20.0000 mg | INTRAMUSCULAR | Status: DC | PRN
  Administered 2023-05-17: 20 mg via INTRAVENOUS
  Filled 2023-05-16: qty 1

## 2023-05-16 MED ORDER — ACETAMINOPHEN 650 MG RE SUPP: 650.0000 mg | Freq: Four times a day (QID) | RECTAL | Status: DC | PRN

## 2023-05-16 MED ORDER — HYDRALAZINE HCL 20 MG/ML IJ SOLN: 10.0000 mg | INTRAMUSCULAR | Status: DC | PRN

## 2023-05-16 MED ORDER — ATORVASTATIN CALCIUM 40 MG PO TABS
40.0000 mg | ORAL_TABLET | Freq: Every day | ORAL | Status: DC
  Administered 2023-05-16 – 2023-05-18 (×3): 40 mg via ORAL
  Filled 2023-05-16 (×3): qty 1

## 2023-05-16 MED ORDER — POTASSIUM CHLORIDE CRYS ER 20 MEQ PO TBCR
40.0000 meq | EXTENDED_RELEASE_TABLET | Freq: Once | ORAL | Status: AC
  Administered 2023-05-16: 40 meq via ORAL
  Filled 2023-05-16: qty 2

## 2023-05-16 MED ORDER — LOSARTAN POTASSIUM 50 MG PO TABS: 100.0000 mg | ORAL_TABLET | Freq: Every day | ORAL | Status: DC

## 2023-05-16 MED ORDER — LABETALOL HCL 5 MG/ML IV SOLN
10.0000 mg | Freq: Once | INTRAVENOUS | Status: AC
  Administered 2023-05-16: 10 mg via INTRAVENOUS
  Filled 2023-05-16: qty 4

## 2023-05-16 NOTE — Consult Note (Addendum)
 Cardiology Consultation   Patient ID: Haven Foss MRN: [id number]; DOB: 1981/01/22  Admit date: 05/15/2023 Date of Consult: 05/16/2023  PCP:  Norm Salt, PA   [facility] Providers Cardiologist:  None        Patient Profile:   Olof Marcil is a 43 y.o. male with a hx of chest pain with hypertensive urgency, hypertension, hyperlipidemia, nonischemic cardiomyopathy, heart failure with moderately reduced ejection fraction, smoking history, morbid obesity, obstructive sleep apnea who is being seen 05/16/2023 for the evaluation of Chest pain and shortness of breath at the request of Sanda Klein, MD.  History of Present Illness:   Prior cardiac history Had a left and right heart cath on 04/2021 due to moderately reduced EF of 45 to 50% and dyspnea.  The cath showed 15% stenosis on the mid to distal LAD and borderline pulmonary hypertension with a PA pressure of 32/19 mean 25 and wedge pressure of 11. Hospitalized on 10/2021 for worsening shortness of breath and chest discomfort. Troponin's were negative. An echo was done and showed an LVEF of 45 to 50% with global hypokinesis, mild LVH, grade 1 diastolic dysfunction.  Was on losartan and Coreg.  GDMT was limited by cost concerns. Presented to the emergency department on 02/2023 for worsening shortness of breath and chest pain.  The BNP was elevated and he was admitted for IV diuresis.  Troponins were consistently elevated at 150.  Echo with LVEF 45 to 50% with global hypokinesis.  No significant changes to the prior echo.  Continue to remain on carvedilol and losartan. During this visit the patient left AMA  Mr. Ardoin presented to the emergency room at [facility] on 05/15/2023 for chest pain and shortness of breath.  The pain was left-sided radiated to the left arm.  The chest pain was more sharp and the arm pain was more dull.  Reported orthopnea and exertional shortness of breath.  In the emergency room he was tachypneic and  hypoxic with SpO2 in the 80s.  was placed on 5 L oxygen [place] and his SPO2 improved. His blood pressure was elevated as high as 228/122.  Pulse was 80-90. High-sensitivity troponins were elevated at 128 -> 127.  EKG showed sinus rhythm, left atrial enlargement, a rate of 90. his CHEM panel was unremarkable the creatinine was 1.02 and alk phos was mildly decreased at 36.  Blood count had was unremarkable and the hemoglobin was 13.5.  The chest x-ray showed mild cardiomegaly, normal pulmonary vasculature, clear lungs, no pleural effusion or pneumothorax.  D-dimer was negative. In the ER was started on IV Lasix, hydralazine, labetalol, and losartan.  On physical exam he reported having a sharp 8/10 left-sided chest pain prior to admission that radiated down the left arm. This pain has waxed and waned over the past 2 months. The chest pain is similar to previous chest pain except it was more severe. Has had difficulty sleeping recently. Has a history of morbid obesity and OSA. Works at [employer] over night. Has a CPAP but it is currently in storage. Uses alcohol to "help sleep." Has smoked cannabis in the past but has not used recently. No tobacco use. Has shortness of breath, pleuritic chest pain, exertional chest pain, and orthopnea. Denies diaphoresis, palpitations, nausea, vomiting, PND, and lower extremity edema  Past Medical History:  Diagnosis Date   CHF (congestive heart failure) (HCC)    Hypertension    Obesity    Sleep apnea     Past Surgical  History:  Procedure Laterality Date   HAND TENDON SURGERY     RIGHT/LEFT HEART CATH AND CORONARY ANGIOGRAPHY N/A 04/17/2021   Procedure: RIGHT/LEFT HEART CATH AND CORONARY ANGIOGRAPHY;  Surgeon: Laurey Morale, MD;  Location: [facility] INVASIVE CV LAB;  Service: Cardiovascular;  Laterality: N/A;       Inpatient Medications: Scheduled Meds:  furosemide  40 mg Intravenous BID   losartan  100 mg Oral Daily   Continuous Infusions:  PRN Meds: acetaminophen  **OR** acetaminophen, hydrALAZINE, melatonin, ondansetron (ZOFRAN) IV  Allergies:   No Known Allergies  Social History:   Social History   Socioeconomic History   Marital status: Single    Spouse name: Not on file   Number of children: 2   Years of education: Not on file   Highest education level: Not on file  Occupational History   Not on file  Tobacco Use   Smoking status: Former    Current packs/day: 0.00    Average packs/day: 0.3 packs/day for 15.0 years (3.8 ttl pk-yrs)    Types: Cigarettes    Start date: 2000    Quit date: 2015    Years since quitting: 10.1   Smokeless tobacco: Never  Vaping Use   Vaping status: Never Used  Substance and Sexual Activity   Alcohol use: Yes    Alcohol/week: 2.0 standard drinks of alcohol    Types: 2 Shots of liquor per week   Drug use: Yes    Types: Marijuana   Sexual activity: Not on file  Other Topics Concern   Not on file  Social History Narrative   Not on file   Social Drivers of Health   Financial Resource Strain: Not on file  Food Insecurity: No Food Insecurity (03/01/2023)   Hunger Vital Sign    Worried About Running Out of Food in the Last Year: Never true    Ran Out of Food in the Last Year: Never true  Transportation Needs: No Transportation Needs (03/01/2023)   PRAPARE - Administrator, Civil Service (Medical): No    Lack of Transportation (Non-Medical): No  Physical Activity: Not on file  Stress: Not on file  Social Connections: Not on file  Intimate Partner Violence: Not At Risk (03/01/2023)   Humiliation, Afraid, Rape, and Kick questionnaire    Fear of Current or Ex-Partner: No    Emotionally Abused: No    Physically Abused: No    Sexually Abused: No    Family History:   History reviewed. No pertinent family history.   ROS:  Please see the history of present illness.   All other ROS reviewed and negative.     Physical Exam/Data:   Vitals:   05/16/23 0832 05/16/23 0900 05/16/23 0930  05/16/23 1000  BP: (!) 166/101 (!) 140/94 (!) 163/96 (!) 149/78  Pulse: 90 66 84 83  Resp: (!) 22 (!) 22 (!) 24 (!) 27  Temp:  98.4 F (36.9 C)    TempSrc:  Oral    SpO2: 96% 97% 98% 96%  Weight:        Intake/Output Summary (Last 24 hours) at 05/16/2023 1045 Last data filed at 05/16/2023 0522 Gross per 24 hour  Intake --  Output 1400 ml  Net -1400 ml      05/15/2023    9:55 PM 03/01/2023    2:42 PM 12/19/2022    6:21 PM  Last 3 Weights  Weight (lbs) 330 lb 11 oz 325 lb 329 lb  Weight (kg)  150 kg 147.419 kg 149.233 kg     Body mass index is 44.85 kg/m.  General:  Well nourished, well developed, in no acute distress. On room air HEENT: normal Neck: no JVD Vascular: Distal pulses 2+ bilaterally Cardiac:  normal S1, S2; RRR; no murmur  Lungs:  clear to auscultation bilaterally, no wheezing, rhonchi or rales  Ext: 1+ edema Musculoskeletal:  No deformities Skin: warm and dry  Neuro:  CNs 2-12 intact, no focal abnormalities noted Psych:  Normal affect   EKG:  The EKG was personally reviewed and demonstrates:  Normal sinus rhythm with a rate of 90 Telemetry:  Telemetry was personally reviewed and demonstrates:  Normal sinus rhythm  Relevant CV Studies:  TTE on 02/2023 IMPRESSIONS     1. Technically difficult study with limited views. Left ventricular  ejection fraction, by estimation, is 45 to 50%. The left ventricle has  mildly decreased function. The left ventricle demonstrates global  hypokinesis. There is mild left ventricular  hypertrophy. Left ventricular diastolic parameters are indeterminate.   2. Right ventricular systolic function was not well visualized. The right  ventricular size is not well visualized. Tricuspid regurgitation signal is  inadequate for assessing PA pressure.   3. The mitral valve is grossly normal. Mild mitral valve regurgitation.   4. The aortic valve was not well visualized. Aortic valve regurgitation  is not visualized. No aortic stenosis  is present.   5. Aortic dilatation noted. There is dilatation of the aortic root,  measuring 42 mm. There is dilatation of the ascending aorta, measuring 39  mm.   FINDINGS   Left Ventricle: Left ventricular ejection fraction, by estimation, is 45  to 50%. The left ventricle has mildly decreased function. The left  ventricle demonstrates global hypokinesis. The left ventricular internal  cavity size was normal in size. There is   mild left ventricular hypertrophy. Left ventricular diastolic parameters  are indeterminate.   Right Ventricle: The right ventricular size is not well visualized. Right  vetricular wall thickness was not well visualized. Right ventricular  systolic function was not well visualized. Tricuspid regurgitation signal  is inadequate for assessing PA  pressure.   Left Atrium: Left atrial size was normal in size.   Right Atrium: Right atrial size was not well visualized.   Pericardium: There is no evidence of pericardial effusion.   Mitral Valve: The mitral valve is grossly normal. Mild mitral valve  regurgitation.   Tricuspid Valve: The tricuspid valve is grossly normal. Tricuspid valve  regurgitation is not demonstrated.   Aortic Valve: The aortic valve was not well visualized. Aortic valve  regurgitation is not visualized. No aortic stenosis is present. Aortic  valve mean gradient measures 3.0 mmHg. Aortic valve peak gradient measures  5.1 mmHg. Aortic valve area, by VTI  measures 3.39 cm.   Pulmonic Valve: The pulmonic valve was not well visualized. Pulmonic valve  regurgitation is not visualized.   Aorta: Aortic dilatation noted. There is dilatation of the aortic root,  measuring 42 mm. There is dilatation of the ascending aorta, measuring 39  mm.   IAS/Shunts: The interatrial septum was not well visualized.   Echocardiogram 04/16/2021:  1. Left ventricular ejection fraction, by estimation, is 45 to 50%. The left ventricle has mildly decreased  function. The left ventricle demonstrates global hypokinesis. There is mild concentric left ventricular hypertrophy. Left ventricular diastolic parameters are consistent with Grade I diastolic dysfunction (impaired relaxation). 2. Right ventricular systolic function is normal. The right ventricular size is  normal. 3. The mitral valve is normal in structure. No evidence of mitral valve regurgitation. 4. The aortic valve is normal in structure. Aortic valve regurgitation is not visualized. 5. Aortic dilatation noted. There is borderline dilatation of the ascending aorta. 6. The inferior vena cava is normal in size with <50% respiratory variability, suggesting right atrial pressure of 8 mmHg.  No significant change from 04/14/2021.  Previously noted inferobasal hypokinesis not present.   Right and left heart catheterization 05/07/2021 [facility] Procedural Findings: Hemodynamics (mmHg) RA mean 3 RV 24/9 PA 32/19, mean 25 PCWP mean 11 LV 141/6 AO 133/96  Oxygen saturations: PA 67% AO 93%  Cardiac Output (Fick) 9.08  Cardiac Index (Fick) 3.48       Laboratory Data:  High Sensitivity Troponin:   Recent Labs  Lab 05/15/23 2211 05/16/23 0011  TROPONINIHS 128* 127*     Chemistry Recent Labs  Lab 05/15/23 2211 05/16/23 0511  NA 140 142  K 3.6 3.5  CL 104 99  CO2 29 31  GLUCOSE 95 103*  BUN 17 19  CREATININE 1.02 0.99  CALCIUM 8.5* 9.0  MG  --  2.2  GFRNONAA >60 >60  ANIONGAP 7 12    Recent Labs  Lab 05/15/23 2211 05/16/23 0511  PROT 6.9 6.8  ALBUMIN 3.7 3.8  AST 24 22  ALT 23 24  ALKPHOS 36* 34*  BILITOT 0.5 0.7   Lipids No results for input(s): "CHOL", "TRIG", "HDL", "LABVLDL", "LDLCALC", "CHOLHDL" in the last 168 hours.  Hematology Recent Labs  Lab 05/15/23 2211 05/16/23 0511  WBC 5.2 5.9  RBC 4.63 4.40  HGB 13.5 12.8*  HCT 45.0 42.1  MCV 97.2 95.7  MCH 29.2 29.1  MCHC 30.0 30.4  RDW 15.9* 16.0*  PLT 270 238   Thyroid No results for input(s): "TSH",  "FREET4" in the last 168 hours.  BNP Recent Labs  Lab 05/15/23 2211  BNP 49.1    DDimer  Recent Labs  Lab 05/16/23 0116  DDIMER <0.27     Radiology/Studies:  DG Chest 2 View Result Date: 05/15/2023 CLINICAL DATA:  Shortness of breath.  CHF. EXAM: CHEST - 2 VIEW COMPARISON:  03/01/2023 FINDINGS: Mild cardiomegaly.The cardiomediastinal contours are normal. The lungs are clear. Pulmonary vasculature is normal. No consolidation, pleural effusion, or pneumothorax. No acute osseous abnormalities are seen. IMPRESSION: Cardiomegaly. No findings of congestive failure or acute pulmonary process. Electronically Signed   By: Narda Rutherford M.D.   On: 05/15/2023 22:40     Assessment and Plan:   Chandler Swiderski is a 43 y.o. male with a hx of hypertension, hyperlipidemia, heart failure with moderately reduced ejection fraction, smoking history, morbid obesity, nonischemic cardiomyopathy, obstructive sleep apnea who is being seen 05/16/2023 for the evaluation of Chest pain and shortness of breath at the request of Sanda Klein, MD.  Hypertensive urgency - chest pain and elevated troponins. 128->127 Prior to admission had sharp left sided 8/10 chest pain that radiated down the left arm. Pain was worse with exertion and was pleuritic. No diaphoresis or palpitations. Had a left heart cath on on 04/2021 that showed 15% stenosis on the mid to distal LAD. Hospitalized on 12/24 for shortness of breath and chest pain at that time troponins were elevated at 150.  - D dimer negative, troponin's were 128->127 EKG showed normal sinus rhythm with a rate of 90 and no acute ischemic changes. - suspect that this chest pain and elevated troponin's are secondary to hypertensive urgency and acute heart failure  causing demand ischemia rather than ACS as troponin's are elevated but flat and blood pressures in the emergency department reached as high as 228/122. The troponin's were also elevated at 150 on prior visit on 12/24. Has  been non-compliant with medications for GDMT. No urgent cardiac ischemic evaluation planned at this time. Will reassess if chest pain resolves following reduction in blood pressure. - continue to monitor BP and chest pain symptoms - Plan for outpatient follow up with cardiology for the chest pain.  - Blood pressured continue to be elevated - Restart Coreg 6.25 mg twice daily. Heart rate currently 70 bpm. - continue losartan 100 mg daily.  Acute on chronic heart failure with moderately reduced ejection fraction Had shortness of breath and had orthopnea. Had no lower extremity edema. - Echo on 12/24 showed LVEF of 45 to 50% with global hypokinesis.  There was no significant changes to the prior echo. The chest x-ray on 3/5 showed mild cardiomegaly, normal pulmonary vasculature, clear lungs, no pleural effusion or pneumothorax.  - Was started on IV lasix. Initially required 5 LO2 [place] to keep SPO2 in the 90's. Shortness of breath has improved and is currently 97% on room air. Today potassium is 3.5, Cr 0.99 (this is baseline). Is recorded to be down 1.4 l from admission. Not volume overloaded on exam. Currently being admitted. - Continue IV lasix 40 mg twice daily. - Restart Coreg 6.25 mg twice daily. - continue losartan 100 mg daily.  Hypertension - Restart Coreg 6.25 mg twice daily - continue losartan 100 mg daily  Coronary artery disease - Had a left heart cath on on 04/2021 that showed 15% stenosis on the mid to distal LAD  Hyperlipidemia LDL 144 on 04/2021 - Order lipid panel and LPA - Restart home atorvastatin 40 mg daily goal LDL <70  Pulmonary hypertension (HCC)  - Right heart cath on 04/2021 showed borderline pulmonary hypertension with a PA pressure of 32/19 mean 25 and wedge pressure of 11.  OSA - Has not been using CPAP recently. Works the night shift at [employer]. Reported drinking alcohol to help sleep. - Order CPAP for inpatient stay - Management per primary  Morbid  obesity - BMI 44.85 - Management per primary  Risk Assessment/Risk Scores:   New York Heart Association (NYHA) Functional Class NYHA Class III        For questions or updates, please contact [facility] Please consult [email or web address] for contact info under    Signed, Arabella Merles, PA-C  05/16/2023 10:45 AM   I have seen and examined the patient along with Arabella Merles, PA-C.  I have reviewed the chart, notes and new data.  I agree with PA/NP's note.  Key new complaints: Marked improvement in dyspnea following diuretics.  He has had excellent urine output.  No chest pain at this time.  He has not been taking his losartan and carvedilol for a while and has been under increased social stress since he had to move.  He refilled his carvedilol prescription and started taking again just a few days before this admission.  He reports that he did have Medicaid which just expired yesterday and he is pretty sure he will get it back.  His CPAP equipment is in storage as part of his move.  I am pretty sure that means he has not really been using it even before he moved. Key examination changes: Morbidly obese, hard to see his jugular venous pulsations.  Lungs are now clear.  Regular rate  and rhythm.  No audible murmurs.  Blood pressure is now still mildly elevated, but greatly improved at 146/90.  Oxygen saturation is now normal. Key new findings / data: Renal parameters and liver function tests.  Minimally elevated and adynamic troponin BNP surprisingly low, likely explained by obesity.  PLAN: We talked a lot about the importance of compliance with his medications. In particular pointed out the fact that carvedilol needs to be taken conscientiously.  Abrupt interruption can lead to rebound hypertension.  Restarting the carvedilol at maximum dose in the setting of depressed ventricular systolic function can actually trigger acute exacerbation of heart failure.  That is what is important to gradually  restart and gradually taper off the carvedilol if he does not have it available.  If he does indeed have Medicaid coverage, we can definitely offer better therapy for his heart failure, losing Entresto and SGLT2 inhibitor.  For the time being continue losartan, carvedilol and oral loop diuretic.  If we can get him on Entresto and SGLT2 inhibitor he may not need the loop diuretic.  Reviewed the importance of compliance with CPAP to avoid long-term worsening of his cardiopulmonary problems.  Thurmon Fair, MD, [facility] [phone] 05/16/2023, 4:19 PM

## 2023-05-16 NOTE — Progress Notes (Signed)
  Carryover admission to the Day Admitter.  I discussed this case with the EDP, Riki Sheer, PA.  Per these discussions:   This is a 43 year old male with history of chronic systolic heart failure, essential hypertension, who is being admitted with acute on chronic systolic heart failure after presenting with 2 months of shortness of breath, which is worsened over the course the last week and has been associated with orthopnea.  He also notes 2 months of intermittent chest pain, with associated intensity no worse over the last week.   He has a history of chronic systolic heart failure, with most recent echocardiogram in December 2024, which showed LVEF 45 to 50% as well as indeterminate diastolic parameters.  Does not appear to be on any diuretic medications as an outpatient.  In the ED today, oxygen saturation decreased to the 80s with ambulation, prompting initiation of 2 L nasal cannula.  No known baseline supplemental oxygen requirements.  Stop blood pressures in the 170s.  Initial troponin 128, with repeat trending down to 127.  This is reported to be consistent with the patient's baseline chronically elevated troponin.  BNP 49.1, compared to 173 at time of admission in December 2024.  EKG reportedly shows no evidence of acute ischemic changes.  Patient reportedly currently chest pain-free.  Chest x-ray shows cardiomegaly, but no evidence of acute cardiopulmonary process.  I have placed an order for inpatient admission to PCU for further evaluation management of the above.  I have placed some additional preliminary admit orders via the adult multi-morbid admission order set. I have also ordered Lasix 40 mg IV twice daily.  I resumed his home losartan and added as needed IV hydralazine for systolic blood pressure greater than 170 mmHg. I have also ordered morning labs in the form of CMP, CBC, magnesium and phosphorus levels.     Newton Pigg, DO Hospitalist

## 2023-05-16 NOTE — ED Notes (Signed)
 Pt reports taking off his nasal canula and not wanting to wear it due to discomfort. Pt reports being on cpap at home but not wearing it due to discomfort. Pt MD notified. Orders placed.

## 2023-05-16 NOTE — H&P (Signed)
 History and Physical    Patient: [name]:[id number] DOB: 03-29-1980 DOA: 05/15/2023 DOS: the patient was seen and examined on 05/16/2023 PCP: Norm Salt, PA  Patient coming from: Home  Chief Complaint:  Chief Complaint  Patient presents with   Shortness of Breath   Chest Pain   HPI: Jesse Morris is a 43 y.o. male with medical history significant of OSA on CPAP, class III obesity, hyperlipidemia, CAD, nonischemic dilated cardiomyopathy, history of alcohol abuse, history of hypertension, history of hypertensive urgency, history of hypertensive emergency, pulmonary hypertension, chronic systolic heart failure who presented to the emergency department complaints of dyspnea, chest pain but denied palpitations, PND, near lower extremity edema. He has been having productive cough of clear sputum. He denied fever, chills, rhinorrhea, sore throat, wheezing or hemoptysis. No chest pain, palpitations, diaphoresis, PND, orthopnea or pitting edema of the lower extremities. No abdominal pain, nausea, emesis, diarrhea, constipation, melena or hematochezia.  No flank pain, dysuria, frequency or hematuria. No polyuria, polydipsia, polyphagia or blurred vision.   Lab work: CBC showed a white count of 5.2, hemoglobin 13.5 g/dL platelets 528.  Normal D-dimer.  CMP showed a corrected calcium of 8.8 mg/dL and alkaline phosphatase 36 units/L, the rest of the CMP measurements were unremarkable.  Troponin was 728, then 127 ng/L and BNP 49.1 pg/mL.  Magnesium 2.2 and phosphorus 3.5 mg/dL.  Imaging: 2 view chest radiograph showing cardiomegaly without findings of CHF or acute pulmonary process.   ED course: Initial vital signs were temperature 98.7 F, pulse 91, respirations 21, BP 171/107 mmHg O2 sat 96% on room air.  The patient received aspirin 325 mg p.o. x 1, hydralazine 20 mg IVP, labetalol 10 mg IVP x 1, furosemide 40 mg IVP x 1 and KCl 40 mEq p.o. x 1.  Review of Systems: As mentioned in the history  of present illness. All other systems reviewed and are negative. Past Medical History:  Diagnosis Date   CHF (congestive heart failure) (HCC)    Hypertension    Obesity    Sleep apnea    Past Surgical History:  Procedure Laterality Date   HAND TENDON SURGERY     RIGHT/LEFT HEART CATH AND CORONARY ANGIOGRAPHY N/A 04/17/2021   Procedure: RIGHT/LEFT HEART CATH AND CORONARY ANGIOGRAPHY;  Surgeon: Laurey Morale, MD;  Location: [facility] INVASIVE CV LAB;  Service: Cardiovascular;  Laterality: N/A;   Social History:  reports that he quit smoking about 10 years ago. His smoking use included cigarettes. He started smoking about 25 years ago. He has a 3.8 pack-year smoking history. He has never used smokeless tobacco. He reports current alcohol use of about 2.0 standard drinks of alcohol per week. He reports current drug use. Drug: Marijuana.  No Known Allergies  History reviewed. No pertinent family history.  Prior to Admission medications   Medication Sig Start Date End Date Taking? Authorizing Provider  losartan (COZAAR) 100 MG tablet Take 1 tablet (100 mg total) by mouth daily. Patient taking differently: Take 50 mg by mouth daily. 10/21/21  Yes Noralee Stain, DO  atorvastatin (LIPITOR) 40 MG tablet Take 1 tablet (40 mg total) by mouth daily. Patient not taking: Reported on 03/01/2023 10/22/21   Noralee Stain, DO  carvedilol (COREG) 12.5 MG tablet Take 1 tablet (12.5 mg total) by mouth 2 (two) times daily. Patient not taking: Reported on 03/01/2023 08/10/22 03/01/23  Yates Decamp, MD  traMADol (ULTRAM) 50 MG tablet Take 1 tablet (50 mg total) by mouth as directed. 1-2 tab  at night as needed Patient not taking: Reported on 03/01/2023 08/10/22   Yates Decamp, MD    Physical Exam: Vitals:   05/16/23 0600 05/16/23 0640 05/16/23 0811 05/16/23 0832  BP: 123/85 137/70  (!) 166/101  Pulse: 78 76  90  Resp: 18 (!) 24  (!) 22  Temp:   98.5 F (36.9 C)   TempSrc:   Oral   SpO2: 97% 92%  96%  Weight:        Physical Exam Vitals and nursing note reviewed.  Constitutional:      General: He is awake. He is not in acute distress.    Appearance: He is well-developed. He is ill-appearing.     Interventions: Nasal cannula in place.  HENT:     Head: Normocephalic.     Nose: No rhinorrhea.  Eyes:     General: No scleral icterus.    Pupils: Pupils are equal, round, and reactive to light.  Neck:     Vascular: No JVD.  Cardiovascular:     Rate and Rhythm: Normal rate and regular rhythm.     Heart sounds: S1 normal and S2 normal.  Pulmonary:     Effort: Pulmonary effort is normal. No respiratory distress.     Breath sounds: No wheezing, rhonchi or rales.  Abdominal:     General: Abdomen is protuberant. Bowel sounds are normal. There is no distension.     Palpations: Abdomen is soft.     Tenderness: There is no abdominal tenderness. There is no guarding.  Musculoskeletal:     Cervical back: Neck supple.     Right lower leg: Edema present.     Left lower leg: Edema present.  Skin:    General: Skin is warm and dry.  Neurological:     General: No focal deficit present.     Mental Status: He is alert and oriented to person, place, and time.  Psychiatric:        Mood and Affect: Mood normal.        Behavior: Behavior normal. Behavior is cooperative.     Data Reviewed:  Results are pending, will review when available.  03/02/2023 transthoracic echocardiogram. IMPRESSIONS:   1. Technically difficult study with limited views. Left ventricular  ejection fraction, by estimation, is 45 to 50%. The left ventricle has  mildly decreased function. The left ventricle demonstrates global  hypokinesis. There is mild left ventricular  hypertrophy. Left ventricular diastolic parameters are indeterminate.   2. Right ventricular systolic function was not well visualized. The right  ventricular size is not well visualized. Tricuspid regurgitation signal is  inadequate for assessing PA pressure.    3. The mitral valve is grossly normal. Mild mitral valve regurgitation.   4. The aortic valve was not well visualized. Aortic valve regurgitation  is not visualized. No aortic stenosis is present.   5. Aortic dilatation noted. There is dilatation of the aortic root,  measuring 42 mm. There is dilatation of the ascending aorta, measuring 39  mm.   EKG: Vent. rate 90 BPM PR interval 164 ms QRS duration 96 ms QT/QTcB 381/467 ms P-R-T axes 48 34 99 Sinus rhythm Left atrial enlargement Nonspecific T abnormalities, lateral leads  Assessment and Plan: Principal Problem:   Chest pain  Associated with dyspnea resulting in:   Acute respiratory failure with hypoxia (HCC) In the setting of   Hypertensive emergency And maybe early   Acute on chronic systolic (congestive) heart failure (HCC) Associated with:   Elevated troponin  In a patient with history of:   Coronary artery disease involving native coronary artery of native heart Does not seem to be overloaded at the moment. However, received furosemide last night achieving significant diuresis. Observation/telemetry. Supplemental oxygen as needed. Sodium and fluid restriction. Continue furosemide 40 mg IVP twice daily. Can continue hydralazine 20 mg every 4 hours as needed. Monitor daily weights, intake and output. Monitor renal function electrolytes. Has had recent echocardiogram. Cardiology will evaluate. -Will follow the recommendations.  Active Problems:   OSA on CPAP Not using CPAP at the moment.    Mixed hyperlipidemia Resume atorvastatin 40 mg p.o. daily.    Obesity, Class III, BMI 40-49.9 (morbid obesity) (HCC) Current BMI 44.85 kg/m. Advised to decrease weight to 200 pounds or less.    Normocytic anemia Monitor hematocrit and hemoglobin.    Pulmonary hypertension (HCC) Needs to stay compliant with antihypertensive and diuretic.    Advance Care Planning:   Code Status: Full Code   Consults:   Family  Communication:   Severity of Illness: The appropriate patient status for this patient is INPATIENT. Inpatient status is judged to be reasonable and necessary in order to provide the required intensity of service to ensure the patient's safety. The patient's presenting symptoms, physical exam findings, and initial radiographic and laboratory data in the context of their chronic comorbidities is felt to place them at high risk for further clinical deterioration. Furthermore, it is not anticipated that the patient will be medically stable for discharge from the hospital within 2 midnights of admission.   * I certify that at the point of admission it is my clinical judgment that the patient will require inpatient hospital care spanning beyond 2 midnights from the point of admission due to high intensity of service, high risk for further deterioration and high frequency of surveillance required.*  Author: Bobette Mo, MD 05/16/2023 8:35 AM  For on call review [email or web address].   This document was prepared using Dragon voice recognition software and may contain some unintended transcription errors.

## 2023-05-16 NOTE — Progress Notes (Signed)
   05/16/23 2144  BiPAP/CPAP/SIPAP  BiPAP/CPAP/SIPAP Pt Type Adult  Reason BIPAP/CPAP not in use Non-compliant (Pt asleep, family member stated pt doesn't wear cpap at home but would call if he decided to wear one)

## 2023-05-17 DIAGNOSIS — I5023 Acute on chronic systolic (congestive) heart failure: Secondary | ICD-10-CM | POA: Diagnosis not present

## 2023-05-17 LAB — BASIC METABOLIC PANEL
Anion gap: 9 (ref 5–15)
BUN: 19 mg/dL (ref 6–20)
CO2: 33 mmol/L — ABNORMAL HIGH (ref 22–32)
Calcium: 8.7 mg/dL — ABNORMAL LOW (ref 8.9–10.3)
Chloride: 96 mmol/L — ABNORMAL LOW (ref 98–111)
Creatinine, Ser: 1.04 mg/dL (ref 0.61–1.24)
GFR, Estimated: >60 mL/min (ref >60)
Glucose, Bld: 100 mg/dL — ABNORMAL HIGH (ref 70–99)
Potassium: 3.6 mmol/L (ref 3.5–5.1)
Sodium: 138 mmol/L (ref 135–145)

## 2023-05-17 LAB — LIPID PANEL
Cholesterol: 225 mg/dL — ABNORMAL HIGH (ref 0–200)
HDL: 67 mg/dL (ref >40)
LDL Cholesterol: 136 mg/dL — ABNORMAL HIGH (ref 0–99)
Total CHOL/HDL Ratio: 3.4 ratio
Triglycerides: 110 mg/dL (ref <150)
VLDL: 22 mg/dL (ref 0–40)

## 2023-05-17 LAB — GLUCOSE, CAPILLARY
Glucose-Capillary: 107 mg/dL — ABNORMAL HIGH (ref 70–99)
Glucose-Capillary: 124 mg/dL — ABNORMAL HIGH (ref 70–99)
Glucose-Capillary: 106 mg/dL — ABNORMAL HIGH (ref 70–99)

## 2023-05-17 MED ORDER — ALBUTEROL SULFATE (2.5 MG/3ML) 0.083% IN NEBU
2.5000 mg | INHALATION_SOLUTION | RESPIRATORY_TRACT | Status: DC | PRN
  Administered 2023-05-17: 2.5 mg via RESPIRATORY_TRACT
  Filled 2023-05-17: qty 3

## 2023-05-17 MED ORDER — LOSARTAN POTASSIUM 50 MG PO TABS
50.0000 mg | ORAL_TABLET | Freq: Every day | ORAL | Status: DC
  Administered 2023-05-18: 50 mg via ORAL
  Filled 2023-05-17: qty 1

## 2023-05-17 NOTE — Progress Notes (Signed)
 Informed Dr. Maryfrances Bunnell patient mentioned chronic left shoulder pain and wanted to know if provider could take a look at it today.

## 2023-05-17 NOTE — Progress Notes (Addendum)
 Rounding Note    Patient Name: Jesse Morris Date of Encounter: 05/17/2023  [facility] Cardiologist: Yates Decamp, MD   Subjective   Patient states he did okay last night but he feels more short of breath this morning. He has just completed a breathing treatment but continues to feel a little short of breath. He states this occurs when he starts to relax and go to sleep. He does have OSA but has not been using CPAP. He denies any recurrent chest pain but states he has not up walking much. He also reports feeling very tired/ drowsy this morning as well a little lightheaded and heavy headed. Checked BP when I was in the room and it was in the 100s/70s.  Inpatient Medications    Scheduled Meds:  atorvastatin  40 mg Oral Daily   carvedilol  6.25 mg Oral BID WC   furosemide  40 mg Intravenous BID   losartan  100 mg Oral Daily   Continuous Infusions:  PRN Meds: acetaminophen **OR** acetaminophen, albuterol, hydrALAZINE, melatonin, ondansetron (ZOFRAN) IV   Vital Signs    Vitals:   05/17/23 0500 05/17/23 0622 05/17/23 0701 05/17/23 0820  BP:  (!) 154/104 (!) 122/91 (!) 122/91  Pulse:  97 85 85  Resp:  18  (!) 21  Temp:  98 F (36.7 C)    TempSrc:      SpO2:  90%    Weight: (!) 151.3 kg     Height:       No intake or output data in the 24 hours ending 05/17/23 0935    05/17/2023    5:00 AM 05/16/2023    6:41 PM 05/15/2023    9:55 PM  Last 3 Weights  Weight (lbs) 333 lb 8.9 oz 336 lb 12.8 oz 330 lb 11 oz  Weight (kg) 151.3 kg 152.771 kg 150 kg      Telemetry    Normal sinus rhythm with rate in the 80s to 90s. - Personally Reviewed  ECG    No new ECG tracing today. - Personally Reviewed  Physical Exam   GEN: Morbidly obese male resting comfortably in no acute distress.   Neck: JVD difficult to assess due to body habitus.  Cardiac: RRR. No murmurs, rubs, or gallops.  Respiratory:  Mild tachypnea but no significant increased work of breathing. Decreased breath  sounds in bilateral bases but no wheezes, rhonchi, or rales appreciated.  GI: Soft,obese, and non-tender.   MS: Trace lower extremity edema bilaterally. No deformity. Neuro:  No focal deficits.   Labs    High Sensitivity Troponin:   Recent Labs  Lab 05/15/23 2211 05/16/23 0011  TROPONINIHS 128* 127*     Chemistry Recent Labs  Lab 05/15/23 2211 05/16/23 0511 05/17/23 0348  NA 140 142 138  K 3.6 3.5 3.6  CL 104 99 96*  CO2 29 31 33*  GLUCOSE 95 103* 100*  BUN 17 19 19   CREATININE 1.02 0.99 1.04  CALCIUM 8.5* 9.0 8.7*  MG  --  2.2  --   PROT 6.9 6.8  --   ALBUMIN 3.7 3.8  --   AST 24 22  --   ALT 23 24  --   ALKPHOS 36* 34*  --   BILITOT 0.5 0.7  --   GFRNONAA >60 >60 >60  ANIONGAP 7 12 9     Lipids  Recent Labs  Lab 05/17/23 0348  CHOL 225*  TRIG 110  HDL 67  LDLCALC 136*  CHOLHDL  3.4    Hematology Recent Labs  Lab 05/15/23 2211 05/16/23 0511  WBC 5.2 5.9  RBC 4.63 4.40  HGB 13.5 12.8*  HCT 45.0 42.1  MCV 97.2 95.7  MCH 29.2 29.1  MCHC 30.0 30.4  RDW 15.9* 16.0*  PLT 270 238   Thyroid No results for input(s): "TSH", "FREET4" in the last 168 hours.  BNP Recent Labs  Lab 05/15/23 2211  BNP 49.1    DDimer  Recent Labs  Lab 05/16/23 0116  DDIMER <0.27     Radiology    DG Chest 2 View Result Date: 05/15/2023 CLINICAL DATA:  Shortness of breath.  CHF. EXAM: CHEST - 2 VIEW COMPARISON:  03/01/2023 FINDINGS: Mild cardiomegaly.The cardiomediastinal contours are normal. The lungs are clear. Pulmonary vasculature is normal. No consolidation, pleural effusion, or pneumothorax. No acute osseous abnormalities are seen. IMPRESSION: Cardiomegaly. No findings of congestive failure or acute pulmonary process. Electronically Signed   By: Narda Rutherford M.D.   On: 05/15/2023 22:40    Cardiac Studies   Right/ Left Cardiac Catheterization 04/17/2021:   Mid LAD to Dist LAD lesion is 15% stenosed.   1. Normal PCWP and RA pressure, mildly elevated LVEDP.  2.  High cardiac output.  3. Borderline pulmonary hypertension.  4. Minimal CAD   Nonischemic cardiomyopathy.  Needs workup for sleep apnea.   Diagnostic Dominance: Left    _______________  Echocardiogram 03/02/2023: Impressions: 1. Technically difficult study with limited views. Left ventricular  ejection fraction, by estimation, is 45 to 50%. The left ventricle has  mildly decreased function. The left ventricle demonstrates global  hypokinesis. There is mild left ventricular  hypertrophy. Left ventricular diastolic parameters are indeterminate.   2. Right ventricular systolic function was not well visualized. The right  ventricular size is not well visualized. Tricuspid regurgitation signal is  inadequate for assessing PA pressure.   3. The mitral valve is grossly normal. Mild mitral valve regurgitation.   4. The aortic valve was not well visualized. Aortic valve regurgitation  is not visualized. No aortic stenosis is present.   5. Aortic dilatation noted. There is dilatation of the aortic root,  measuring 42 mm. There is dilatation of the ascending aorta, measuring 39  mm.    Patient Profile     43 y.o. 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 Cardiology consulted for assistance at the request of Dr. Robb Matar.  Assessment & Plan    Chest Pain Minimal Non-Obstructive CAD LHC in 04/2021 showed minimal non-obstructive CAD with only 15% stenosis of mid to distal LAD. Patient has a history of chest pain in the setting of hypertensive emergency. He presented with chest pain and shortness of breath and initial BP was 228/122. EKG  showed now acute ischemic changes. High-sensitivity troponin mildly elevated and flat at 128 << 127 not consistent with ACS. D-dimer negative. - Currently chest pain free.  - Continue statin.  - He has not been on Aspirin due to only minimal CAD. - Symptoms again felt to be due to hypertensive emergency and CHF. No plans for ischemic evaluation at this time.  Acute on Chronic HFmrEF BNP 49.1. Chest x-ray showed cardiomegaly but no signs of of acute CHF. Echo  in 02/2023 showed LVEF of 45-50% with global hypokinesis. RV not well visualized. He was started on IV Lasix in the ED. Documented urinary output of 1.4 L but suspect that output has not been fully documented. Weight down 3lbs from yesterday. Renal function stable.  - Volume status difficult to assess due to body habitus. No significant lower extremity edema but he does have some decreased breath sounds in bases. He is also feeling more short of breath this morning.  - Continue IV Lasix 40mg  twice daily. - Will decrease Losartan to 50mg  daily given soft BP and symptoms of hypotension. - Continue Coreg 6.25mg  twice daily. - Will hold off on starting Spironolactone or SGLT2 inhibitor right now given soft BP this morning. - Continue to monitor daily weights, strict I/Os, and renal function.  Hypertensive Emergency BP initially 228/122. He had not been compliant with his home mediations. He was diuresed and restarted on oral medications with significant improvement.  - BP in the 120-150/ 90-100s this morning. However, BP while I was in the room was 100/70 and he is symptomatic with this (lightheadedness).  - Will decrease Losartan to 50mg  daily. - Continue Coreg 6.25mg  twice daily.  - Continue diuresis as above. - Can continue to adjust BP medications as needed based on readings and symptoms.   Hyperlipidemia Lipid panel this admission: Total Cholesterol 225, Triglycerides 110, HDL 67, LDL 136. LDL goal <70 given CAD. - He has been restarted  on Lipitor 40mg  daily this admission. - Will need repeat lipid panel and LFTs in 6-8 weeks.  Obstructive Sleep Apnea He has been non-compliant with home CPAP recently. - Encouraged compliance with CPAP.    For questions or updates, please contact [facility] Please consult [email or web address] for contact info under        Signed, Corrin Parker, PA-C  05/17/2023, 9:35 AM     I have seen and examined the patient along with Corrin Parker, PA-C .  I have reviewed the chart, notes and new data.  I agree with PA/NP's note.  Key new complaints: Weak and a little more breathless this morning. Key examination changes: blood pressure is substantially lower than yesterday Key new findings / data: Borderline low potassium  PLAN: Continue diuresis and potassium supplementation.  Agree with decreasing the dose of ARB while we are actively diuresing to avoid hypotension. CSW in his room trying to assist with re-establishing his Medicaid coverage.  Thurmon Fair, MD, [facility] [phone] 05/17/2023, 12:56 PM

## 2023-05-17 NOTE — Plan of Care (Signed)
  Problem: Clinical Measurements: Goal: Will remain free from infection Outcome: Progressing Goal: Diagnostic test results will improve Outcome: Progressing  Problem: Clinical Measurements: Goal: Respiratory complications will improve Outcome: Not Progressing Goal: Cardiovascular complication will be avoided Outcome: Not Progressing   Problem: Activity: Goal: Risk for activity intolerance will decrease Outcome: Adequate for Discharge   Problem: Nutrition: Goal: Adequate nutrition will be maintained Outcome: Adequate for Discharge

## 2023-05-17 NOTE — Progress Notes (Signed)
  Progress Note   Patient: Jesse Morris HQI:[id number] DOB: 22-Apr-1980 DOA: 05/15/2023     1 DOS: the patient was seen and examined on 05/17/2023 at 9:13AM      Brief hospital course: 43 yo M with MO, CHF, HTN, OSA who presented with hypertensive emergency, CHF flare.     Assessment and Plan: Acute on chronic systolic congestive heart failure Hypertensive emergency Echo shows EF 45 to 50%. No ins and outs recorded, weight down 1.5 kg - Continue IV Lasix - Monitor potassium, daily BMP, and strict ins and outs, daily weights - Consult cardiology, appreciate recommendations   Hyperlipidemia -Continue Lipitor  Sleep apnea -CPAP at night  Morbid obesity Class III, BMI 45  Demand ischemia Due to CHF, hypertensive emergency.  No ischemic workup planned  Coronary artery disease Nonobstructive coronary disease, previously known - Continue Lipitor - Aspirin deferred due to low burden of plaque  Acute respiratory failure with hypoxia due to CHF Tachypneic and hypoxic to 85% requiring up to 5 L high flow nasal cannula.  Now weaned to room air with control of blood pressure and pulmonary edema.  Pulmonary hypertension  Shoulder pain, left Sounds like rotator cuff pain.  Recommend outpatient PT and work up.        Subjective: Patient dyspneic this morning, has left shoulder pain.  No fever, confusion, sputum, chest pain.     Physical Exam: BP 111/84 (BP Location: Right Arm)   Pulse 77   Temp 98.4 F (36.9 C) (Oral)   Resp (!) 24   Ht 6' (1.829 m)   Wt (!) 151.3 kg   SpO2 97%   BMI 45.24 kg/m   Adult male, sitting up in bed, interactive and appropriate Appears tired Lung sounds diminished due to body habitus, no rales appreciated Heart sounds distant, no pitting in the extremities, JVP not visible Abdomen soft no tenderness palpation The left shoulder appears normal, but there is pain with range of motion Attention normal, affect appropriate, judgment and  insight appear normal     Data Reviewed: Discussed with cardiology Basic metabolic panel shows stable renal function and potassium CBC shows minimal anemia LDL 136 Echocardiogram shows EF 45 to 50%    Family Communication: Wife at the bedside    Disposition: Status is: Inpatient         Author: Alberteen Sam, MD 05/17/2023 5:32 PM  For on call review [email or web address].

## 2023-05-17 NOTE — Plan of Care (Signed)

## 2023-05-17 NOTE — TOC Initial Note (Signed)
 Transition of Care [facility]) - Initial/Assessment Note   Patient Details  Name: Jesse Morris MRN: [id number] Date of Birth: 1980-06-24  [facility]) CM/SW Contact:    Ewing Schlein, LCSW Phone Number: 05/17/2023, 2:23 PM  Clinical Narrative: CSW met with patient to discuss SDOH screening. Patient reported he is currently residing with family, but is agreeable to having information for utilities and transportation added to AVS. Patient reported his Medicaid stopped on March 5 and he is trying to figure out why it is no longer active. CSW encouraged patient to call his Medicaid to find out what he needs to do for it to be active again. Transportation and utilities information added to AVS.                Expected Discharge Plan: Home/Self Care Barriers to Discharge: Continued Medical Work up, Inadequate or no insurance  Patient Goals and CMS Choice Patient states their goals for this hospitalization and ongoing recovery are:: Get Medicaid reinstated Choice offered to / list presented to : NA  Expected Discharge Plan and Services In-house Referral: Clinical Social Work Post Acute Care Choice: NA Living arrangements for the past 2 months: Apartment             DME Arranged: N/A DME Agency: NA  Prior Living Arrangements/Services Living arrangements for the past 2 months: Apartment Lives with:: Relatives Patient language and need for interpreter reviewed:: Yes Do you feel safe going back to the place where you live?: Yes      Need for Family Participation in Patient Care: No (Comment) Care giver support system in place?: Yes (comment) Criminal Activity/Legal Involvement Pertinent to Current Situation/Hospitalization: No - Comment as needed  Activities of Daily Living ADL Screening (condition at time of admission) Independently performs ADLs?: Yes (appropriate for developmental age) Is the patient deaf or have difficulty hearing?: No Does the patient have difficulty seeing, even  when wearing glasses/contacts?: No Does the patient have difficulty concentrating, remembering, or making decisions?: No  Emotional Assessment Appearance:: Appears stated age Attitude/Demeanor/Rapport: Engaged Affect (typically observed): Accepting Orientation: : Oriented to Self, Oriented to Place, Oriented to  Time, Oriented to Situation Alcohol / Substance Use: Not Applicable Psych Involvement: No (comment)  Admission diagnosis:  Shortness of breath [R06.02] Acute on chronic systolic (congestive) heart failure (HCC) [I50.23] Chest pain, unspecified type [R07.9] Patient Active Problem List   Diagnosis Date Noted   Acute on chronic systolic (congestive) heart failure (HCC) 05/16/2023   Elevated troponin 05/16/2023   Chest pain 05/16/2023   Hypertensive urgency 03/01/2023   Coronary artery disease involving native coronary artery of native heart 10/21/2021   Marijuana use 10/21/2021   Shortness of breath 10/21/2021   Alcohol use 10/21/2021   Cardiomyopathy, dilated, nonischemic (HCC) 10/21/2021   Hypertensive emergency 10/20/2021   Acute systolic (congestive) heart failure (HCC) 04/18/2021   Pulmonary hypertension (HCC) 04/17/2021   Hypoxia    Normocytic anemia 04/15/2021   Obesity, Class III, BMI 40-49.9 (morbid obesity) (HCC) 04/14/2021   Mixed hyperlipidemia 04/14/2021   Hypokalemia 04/14/2021   Elevated troponin level not due myocardial infarction 04/14/2021   Acute respiratory failure with hypoxia (HCC) 04/13/2021   OSA on CPAP 04/13/2021   HTN (hypertension) 04/13/2021   Influenza A 03/16/2021   PCP:  Norm Salt, PA Pharmacy:   [facility] [address] [place] - [address] [place] [address] Phone: [phone] Fax: [phone]  [id number] - [place], [place] -  [address] [place] [address] Phone: [phone] Fax: [phone]  Social Drivers of Health  (SDOH) Social History: SDOH Screenings   Food Insecurity: No Food Insecurity (05/16/2023)  Housing: High Risk (05/16/2023)  Transportation Needs: Unmet Transportation Needs (05/16/2023)  Utilities: At Risk (05/16/2023)  Tobacco Use: Medium Risk (05/16/2023)   SDOH Interventions: Housing Interventions:  (Currently residing with family) Transportation Interventions: Other (Comment), Inpatient TOC (Transportation information added to AVS. Patient to work on getting his Medicaid reinstated.) Utilities Interventions: Inpatient TOC, Other (Comment) (Utilities information added to AVS.)  Readmission Risk Interventions     No data to display

## 2023-05-17 NOTE — Progress Notes (Signed)
   05/17/23 2245  BiPAP/CPAP/SIPAP  BiPAP/CPAP/SIPAP Pt Type Adult  Reason BIPAP/CPAP not in use Non-compliant (pt does not wear)

## 2023-05-18 ENCOUNTER — Other Ambulatory Visit (HOSPITAL_COMMUNITY): Payer: Self-pay

## 2023-05-18 DIAGNOSIS — I5023 Acute on chronic systolic (congestive) heart failure: Secondary | ICD-10-CM | POA: Diagnosis not present

## 2023-05-18 LAB — BASIC METABOLIC PANEL
Anion gap: 13 (ref 5–15)
BUN: 19 mg/dL (ref 6–20)
CO2: 27 mmol/L (ref 22–32)
Calcium: 8.7 mg/dL — ABNORMAL LOW (ref 8.9–10.3)
Chloride: 98 mmol/L (ref 98–111)
Creatinine, Ser: 1.05 mg/dL (ref 0.61–1.24)
GFR, Estimated: >60 mL/min (ref >60)
Glucose, Bld: 100 mg/dL — ABNORMAL HIGH (ref 70–99)
Potassium: 3.8 mmol/L (ref 3.5–5.1)
Sodium: 138 mmol/L (ref 135–145)

## 2023-05-18 LAB — CBC
HCT: 49.7 % (ref 39.0–52.0)
Hemoglobin: 14.9 g/dL (ref 13.0–17.0)
MCH: 29.6 pg (ref 26.0–34.0)
MCHC: 30 g/dL (ref 30.0–36.0)
MCV: 98.6 fL (ref 80.0–100.0)
Platelets: 286 10*3/uL (ref 150–400)
RBC: 5.04 MIL/uL (ref 4.22–5.81)
RDW: 15.9 % — ABNORMAL HIGH (ref 11.5–15.5)
WBC: 6.7 10*3/uL (ref 4.0–10.5)
nRBC: 0 % (ref 0.0–0.2)

## 2023-05-18 MED ORDER — CARVEDILOL 6.25 MG PO TABS
6.2500 mg | ORAL_TABLET | Freq: Two times a day (BID) | ORAL | 0 refills | Status: DC
  Filled 2023-05-18: qty 60, 30d supply, fill #0
  Filled 2023-05-18: qty 180, 90d supply, fill #0
  Filled 2023-08-14: qty 60, 30d supply, fill #1

## 2023-05-18 MED ORDER — ATORVASTATIN CALCIUM 40 MG PO TABS
40.0000 mg | ORAL_TABLET | Freq: Every day | ORAL | 0 refills | Status: DC
  Filled 2023-05-18: qty 90, 90d supply, fill #0

## 2023-05-18 MED ORDER — FUROSEMIDE 20 MG PO TABS
20.0000 mg | ORAL_TABLET | Freq: Every day | ORAL | Status: DC
  Administered 2023-05-18: 20 mg via ORAL
  Filled 2023-05-18: qty 1

## 2023-05-18 MED ORDER — FUROSEMIDE 20 MG PO TABS
20.0000 mg | ORAL_TABLET | Freq: Every day | ORAL | 0 refills | Status: DC
  Filled 2023-05-18: qty 30, 30d supply, fill #0
  Filled 2023-05-18: qty 90, 90d supply, fill #0
  Filled 2023-08-14: qty 30, 30d supply, fill #1

## 2023-05-18 MED ORDER — LOSARTAN POTASSIUM 50 MG PO TABS
50.0000 mg | ORAL_TABLET | Freq: Every day | ORAL | 0 refills | Status: DC
Start: 2023-05-19 — End: 2023-09-19
  Filled 2023-05-18: qty 90, 90d supply, fill #0

## 2023-05-18 NOTE — Progress Notes (Signed)
 Pt has discharge meds at [facility] - pharmacist is unable to do a 90 day fill on meds. Plan is to do a 30 day fill so Trendon does not miss any doses & pt will follow up w/ his PCP for med refill. Pt states he was able to reactivate medicaid

## 2023-05-18 NOTE — Progress Notes (Signed)
 AVS reviewed w/ pt who verbalized an understanding- PIV removed as noted - pt has work excuse- No other questions at this time- pt ambulated to lobby w/ s/o and this  RN - TOC meds picked up by this RN at discharge & delivered to pt waiting in lobby for an uber.

## 2023-05-18 NOTE — Discharge Summary (Signed)
 Physician Discharge Summary   Patient: Jesse Morris MRN: [id number] DOB: 02/02/1981  Admit date:     05/15/2023  Discharge date: 05/18/23  Discharge Physician: Jesse Morris   PCP: Jesse Salt, PA     Recommendations at discharge:  Follow up with PCP Jesse Morris in 1 week for CHF Follow up with Cardiology Jesse Morris when arranged (appointment request sent) Jesse Morris: Please obtain BMP in 5 days (this Thu or Fri) on new Lasix/losartan doses Please follow up CPAP use and refer to sleep medicine for re-evaluation if needed Please follow up left shoulder rotator cuff pain and refer to PT or orthopedics if appropriate Please check lipids in 4 to 6 weeks     Discharge Diagnoses: Principal Problem:   Acute on chronic systolic (congestive) heart failure (HCC) Active Problems:   Hypertensive emergency   Coronary artery disease involving native coronary artery of native heart   OSA on CPAP   Mixed hyperlipidemia   Acute respiratory failure with hypoxia (HCC)   Obesity, Class III, BMI 40-49.9 (morbid obesity) (HCC)   Normocytic anemia   Elevated troponin   Chest pain      Hospital Course: 43 y.o. M with history MO, sCHF, EF 45-50%, NICM, HTN, and OSA who presented with DOE and orthopnea and chest pain, found to have severe range hypertension, CHF.   Acute on chronic systolic congestive heart failure Hypertensive emergency Echo last December showed EF 45 to 50%.  Known nonischemic cardiomyopathy.  Cardiology suspected his current CHF flare was due to abrupt resumption of carvedilol as well as hypertensive emergency.  Admitted and started on antihypertensives, IV Lasix.  He was diuresed well, weaned off of oxygen, able to ambulate without dyspnea.  Discharged on: Furosemide 20 mg daily Losartan 50 mg daily Carvedilol 6.25 mg twice daily  Cardiology referral was made, appointment pending, Entresto/GDMT titration deferred to Jesse Morris.     Hyperlipidemia Resumed on Lipitor  Sleep apnea Reports that his CPAP in storage, although he has had trouble with adherence due to feeling like he cannot "exhale".  Strongly recommended he resume PAP therapy.   Morbid obesity Class III, BMI 45   Demand ischemia Due to CHF, hypertensive emergency.  Evaluated by cardiology, no ischemic workup planned.   Coronary artery disease Had nonobstructive coronary artery disease on left heart cath in 2023.  On Lipitor again, aspirin deferred due to low burden of plaque   Acute respiratory failure with hypoxia due to CHF Tachypneic and hypoxic to 85% on admission requiring up to 5 L high flow nasal cannula.  Weaned to room air with control of blood pressure and pulmonary edema.   Pulmonary hypertension   Shoulder pain, left Discussed with patient.  Examined shoulder appears normal. He describes an overuse injury.  Recommend outpatient PT and work up.              The [facility] Controlled Substances Registry was reviewed for this patient prior to discharge.  Consultants: Cardiology    Disposition: Home Diet recommendation:  Discharge Diet Orders (From admission, onward)     Start     Ordered   05/18/23 0000  Diet - low sodium heart healthy        05/18/23 1141             DISCHARGE MEDICATION: Allergies as of 05/18/2023   No Known Allergies      Medication List     STOP taking these medications    traMADol  50 MG tablet Commonly known as: Ultram       TAKE these medications    atorvastatin 40 MG tablet Commonly known as: LIPITOR Take 1 tablet (40 mg total) by mouth daily.   carvedilol 6.25 MG tablet Commonly known as: COREG Take 1 tablet (6.25 mg total) by mouth 2 (two) times daily with a meal. What changed:  medication strength how much to take when to take this   furosemide 20 MG tablet Commonly known as: LASIX Take 1 tablet (20 mg total) by mouth daily. Start taking on: May 19, 2023    losartan 50 MG tablet Commonly known as: COZAAR Take 1 tablet (50 mg total) by mouth daily. Start taking on: May 19, 2023 What changed:  medication strength how much to take        Follow-up Information     [employer]. Call.   Why: Utility assistance/utility hotline: 563-626-6005 ext. 314 Contact information: [address]. [employer] [phone]        [facility] [employer]. Call.   Why: Energy Assistance Program (heating/cooling and water assistance) Contact information: [phone]. Call.   Why: Utility assistance Contact information: [phone]        [facility] [employer]. Call.   Contact information: [address] [place], [place] [address] [phone]        Jesse Morris, [place]. Schedule an appointment as soon as possible for a visit in 1 week(s).   Specialty: Physician Assistant Contact information: [address] [place] [address] [phone]         Yates Decamp, MD. Go to.   Specialty: Cardiology Contact information: [address] [place] [address] [phone]                 Discharge Instructions     Diet - low sodium heart healthy   Complete by: As directed    Discharge instructions   Complete by: As directed    **IMPORTANT DISCHARGE INSTRUCTIONS**   From Dr. Maryfrances Morris: You were admitted for a heart failure flare, caused by severe hypertension (high blood pressure)  You were evaluated by a cardiologist and they changed several of your heart protection medicinces    They are only getting startedon this though, and so you need to see your heart specialist Jesse Morris in the office to continue  We have sent a referral to Jesse Morris but please call his office to follow up and confirm your appointment if you haven't heard from them by the end of the week  Go see Jesse Morris in 1 week and make sure she (or someone) checks your lab work by this Thursday or Friday  START Lasix 20 mg daily  Weigh yourself every day Your weight today when you get home from the hospital is your "dry weight"  If your weight is ever  5 lbs MORE THAN your dry weight, this is likely water weight and you should call Dr. Jesse Morris for instructions  RESUME carvedilol/Coreg 6.25 mg twice daily Do not start or stop this medication suddenly  RESUME losartan 50 mg nightly  RESUME atorvastatin/Lipitor 40 mg nightly (this is a cholesterol medicine)  Thankfully, all of the heart protection medicines (losartan, carvedilol and others) that will help your heart squeeze improve, will also lower your blood pressure.  ALSO, using your CPAP at night every  night and losing weight will also help your blood pressure   Increase activity slowly   Complete by: As directed        Discharge Exam: Filed Weights   05/16/23 1841 05/17/23 0500 05/18/23 0500  Weight: (!) 152.8 kg (!) 151.3 kg (!) 150.1 kg    General: Pt is alert, awake, not in acute distress Cardiovascular: RRR, nl S1-S2, no murmurs appreciated.   No LE edema.   Respiratory: Normal respiratory rate and rhythm.  CTAB without rales or wheezes. Abdominal: Abdomen soft and non-tender.  No distension or HSM.   Neuro/Psych: Strength symmetric in upper and lower extremities.  Judgment and insight appear normal    Condition at discharge: good  The results of significant diagnostics from this hospitalization (including imaging, microbiology, ancillary and laboratory) are listed below for reference.   Imaging Studies: DG Chest 2 View Result Date: 05/15/2023 CLINICAL DATA:  Shortness of breath.  CHF. EXAM: CHEST - 2 VIEW COMPARISON:  03/01/2023 FINDINGS: Mild cardiomegaly.The cardiomediastinal contours are normal. The lungs are clear. Pulmonary vasculature is normal. No consolidation, pleural effusion, or pneumothorax. No acute  osseous abnormalities are seen. IMPRESSION: Cardiomegaly. No findings of congestive failure or acute pulmonary process. Electronically Signed   By: Narda Rutherford M.D.   On: 05/15/2023 22:40    Microbiology: Results for orders placed or performed during the hospital encounter of 04/13/21  Blood culture (routine x 2)     Status: None   Collection Time: 04/13/21  7:02 PM   Specimen: Left Antecubital; Blood  Result Value Ref Range Status   Specimen Description   Final    LEFT ANTECUBITAL BLOOD Performed at [facility], [address], [place], [place] [address]    Special Requests   Final    BOTTLES DRAWN AEROBIC AND ANAEROBIC Blood Culture adequate volume Performed at [facility], [address] [place], [place] [address]    Culture   Final    NO GROWTH 5 DAYS Performed at [facility], [address] [place], [place] [address]    Report Status 04/18/2021 FINAL  Final  Blood culture (routine x 2)     Status: None   Collection Time: 04/13/21  7:02 PM   Specimen: BLOOD  Result Value Ref Range Status   Specimen Description   Final    BLOOD BLOOD LEFT FOREARM Performed at [facility], [address] [place], [place] [address]    Special Requests   Final    BOTTLES DRAWN AEROBIC AND ANAEROBIC Blood Culture results may not be optimal due to an excessive volume of blood received in culture bottles Performed at [facility], [address] [place], [place] [address]    Culture   Final    NO GROWTH 5 DAYS Performed at [facility], [address] [place], [place] [address]    Report Status 04/18/2021 FINAL  Final  Resp Panel by RT-PCR (Flu A&B, Covid)     Status: None   Collection Time: 04/13/21  7:05 PM   Specimen: Nasopharyngeal(NP) swabs in vial transport medium  Result Value Ref Range Status   SARS Coronavirus 2 by RT PCR NEGATIVE NEGATIVE Final    Comment: (NOTE) SARS-CoV-2 target  nucleic acids are NOT DETECTED.  The SARS-CoV-2 RNA is generally detectable in upper respiratory specimens during the acute phase of infection. The lowest concentration of SARS-CoV-2 viral copies this assay can detect is 138 copies/mL. A negative result  does not preclude SARS-Cov-2 infection and should not be used as the sole basis for treatment or other patient management decisions. A negative result may occur with  improper specimen collection/handling, submission of specimen other than nasopharyngeal swab, presence of viral mutation(s) within the areas targeted by this assay, and inadequate number of viral copies(<138 copies/mL). A negative result must be combined with clinical observations, patient history, and epidemiological information. The expected result is Negative.  Fact Sheet for Patients:  BloggerCourse.com  Fact Sheet for Healthcare Providers:  SeriousBroker.it  This test is no t yet approved or cleared by the [place] FDA and  has been authorized for detection and/or diagnosis of SARS-CoV-2 by FDA under an Emergency Use Authorization (EUA). This EUA will remain  in effect (meaning this test can be used) for the duration of the COVID-19 declaration under Section 564(b)(1) of the Act, 21 U.S.C.section 360bbb-3(b)(1), unless the authorization is terminated  or revoked sooner.       Influenza A by PCR NEGATIVE NEGATIVE Final   Influenza B by PCR NEGATIVE NEGATIVE Final    Comment: (NOTE) The Xpert Xpress SARS-CoV-2/FLU/RSV plus assay is intended as an aid in the diagnosis of influenza from Nasopharyngeal swab specimens and should not be used as a sole basis for treatment. Nasal washings and aspirates are unacceptable for Xpert Xpress SARS-CoV-2/FLU/RSV testing.  Fact Sheet for Patients: BloggerCourse.com  Fact Sheet for Healthcare  Providers: SeriousBroker.it  This test is not yet approved or cleared by the [place] FDA and has been authorized for detection and/or diagnosis of SARS-CoV-2 by FDA under an Emergency Use Authorization (EUA). This EUA will remain in effect (meaning this test can be used) for the duration of the COVID-19 declaration under Section 564(b)(1) of the Act, 21 U.S.C. section 360bbb-3(b)(1), unless the authorization is terminated or revoked.  Performed at [facility], [address], [place], [place] [address]     Labs: CBC: Recent Labs  Lab 05/15/23 2211 05/16/23 0511 05/18/23 0810  WBC 5.2 5.9 6.7  NEUTROABS  --  3.4  --   HGB 13.5 12.8* 14.9  HCT 45.0 42.1 49.7  MCV 97.2 95.7 98.6  PLT 270 238 286   Basic Metabolic Panel: Recent Labs  Lab 05/15/23 2211 05/16/23 0511 05/17/23 0348 05/18/23 0423  NA 140 142 138 138  K 3.6 3.5 3.6 3.8  CL 104 99 96* 98  CO2 29 31 33* 27  GLUCOSE 95 103* 100* 100*  BUN 17 19 19 19   CREATININE 1.02 0.99 1.04 1.05  CALCIUM 8.5* 9.0 8.7* 8.7*  MG  --  2.2  --   --   PHOS  --  3.5  --   --    Liver Function Tests: Recent Labs  Lab 05/15/23 2211 05/16/23 0511  AST 24 22  ALT 23 24  ALKPHOS 36* 34*  BILITOT 0.5 0.7  PROT 6.9 6.8  ALBUMIN 3.7 3.8   CBG: Recent Labs  Lab 05/17/23 0743 05/17/23 1137 05/17/23 1709  GLUCAP 107* 124* 106*    Discharge time spent: approximately 45 minutes spent on discharge counseling, evaluation of patient on day of discharge, and coordination of discharge planning with nursing, social work, pharmacy and case management  Signed: Alberteen Sam, MD [facility] 05/18/2023

## 2023-05-18 NOTE — Progress Notes (Signed)
 Rounding Note    Patient Name: Jesse Morris Date of Encounter: 05/18/2023  [facility] Cardiologist: Yates Decamp, MD   Subjective   Dyspnea improved He indicates medicaid issues resolved Ready for d/c Good diuresis  Inpatient Medications    Scheduled Meds:  atorvastatin  40 mg Oral Daily   carvedilol  6.25 mg Oral BID WC   losartan  50 mg Oral Daily   Continuous Infusions:  PRN Meds: acetaminophen **OR** acetaminophen, albuterol, hydrALAZINE, melatonin, ondansetron (ZOFRAN) IV   Vital Signs    Vitals:   05/18/23 0428 05/18/23 0500 05/18/23 0745 05/18/23 0800  BP: (!) 145/88   121/77  Pulse: 84   88  Resp: 20   18  Temp: 98.8 F (37.1 C)     TempSrc: Oral     SpO2: 95%  98% 100%  Weight:  (!) 150.1 kg    Height:        Intake/Output Summary (Last 24 hours) at 05/18/2023 0835 Last data filed at 05/18/2023 0715 Gross per 24 hour  Intake 600 ml  Output 2500 ml  Net -1900 ml      05/18/2023    5:00 AM 05/17/2023    5:00 AM 05/16/2023    6:41 PM  Last 3 Weights  Weight (lbs) 330 lb 14.6 oz 333 lb 8.9 oz 336 lb 12.8 oz  Weight (kg) 150.1 kg 151.3 kg 152.771 kg      Telemetry    Normal sinus rhythm with rate in the 80s to 90s. - Personally Reviewed  ECG    No new ECG tracing today. - Personally Reviewed  Physical Exam   GEN: Morbidly obese male resting comfortably in no acute distress.   Neck: JVD difficult to assess due to body habitus.  Cardiac: RRR. No murmurs, rubs, or gallops.  Respiratory:  Mild tachypnea but no significant increased work of breathing. Decreased breath sounds in bilateral bases but no wheezes, rhonchi, or rales appreciated.  GI: Soft,obese, and non-tender.   MS: Trace lower extremity edema bilaterally. No deformity. Neuro:  No focal deficits.   Labs    High Sensitivity Troponin:   Recent Labs  Lab 05/15/23 2211 05/16/23 0011  TROPONINIHS 128* 127*     Chemistry Recent Labs  Lab 05/15/23 2211 05/16/23 0511  05/17/23 0348 05/18/23 0423  NA 140 142 138 138  K 3.6 3.5 3.6 3.8  CL 104 99 96* 98  CO2 29 31 33* 27  GLUCOSE 95 103* 100* 100*  BUN 17 19 19 19   CREATININE 1.02 0.99 1.04 1.05  CALCIUM 8.5* 9.0 8.7* 8.7*  MG  --  2.2  --   --   PROT 6.9 6.8  --   --   ALBUMIN 3.7 3.8  --   --   AST 24 22  --   --   ALT 23 24  --   --   ALKPHOS 36* 34*  --   --   BILITOT 0.5 0.7  --   --   GFRNONAA >60 >60 >60 >60  ANIONGAP 7 12 9 13     Lipids  Recent Labs  Lab 05/17/23 0348  CHOL 225*  TRIG 110  HDL 67  LDLCALC 136*  CHOLHDL 3.4    Hematology Recent Labs  Lab 05/15/23 2211 05/16/23 0511  WBC 5.2 5.9  RBC 4.63 4.40  HGB 13.5 12.8*  HCT 45.0 42.1  MCV 97.2 95.7  MCH 29.2 29.1  MCHC 30.0 30.4  RDW 15.9* 16.0*  PLT 270 238   Thyroid No results for input(s): "TSH", "FREET4" in the last 168 hours.  BNP Recent Labs  Lab 05/15/23 2211  BNP 49.1    DDimer  Recent Labs  Lab 05/16/23 0116  DDIMER <0.27     Radiology    No results found.   Cardiac Studies   Right/ Left Cardiac Catheterization 04/17/2021:   Mid LAD to Dist LAD lesion is 15% stenosed.   1. Normal PCWP and RA pressure, mildly elevated LVEDP.  2. High cardiac output.  3. Borderline pulmonary hypertension.  4. Minimal CAD   Nonischemic cardiomyopathy.  Needs workup for sleep apnea.   Diagnostic Dominance: Left    _______________  Echocardiogram 03/02/2023: Impressions: 1. Technically difficult study with limited views. Left ventricular  ejection fraction, by estimation, is 45 to 50%. The left ventricle has  mildly decreased function. The left ventricle demonstrates global  hypokinesis. There is mild left ventricular  hypertrophy. Left ventricular diastolic parameters are indeterminate.   2. Right ventricular systolic function was not well visualized. The right  ventricular size is not well visualized. Tricuspid regurgitation signal is  inadequate for assessing PA pressure.   3. The mitral  valve is grossly normal. Mild mitral valve regurgitation.   4. The aortic valve was not well visualized. Aortic valve regurgitation  is not visualized. No aortic stenosis is present.   5. Aortic dilatation noted. There is dilatation of the aortic root,  measuring 42 mm. There is dilatation of the ascending aorta, measuring 39  mm.    Patient Profile     43 y.o. 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 Cardiology consulted for assistance at the request of Dr. Robb Matar.  Assessment & Plan    Chest Pain Minimal Non-Obstructive CAD -LHC in 04/2021 showed minimal non-obstructive CAD - no plans for any further ischemic evaluaiton  Acute on Chronic HFmrEF -BNP 49.1. Chest x-ray showed cardiomegaly but no signs of of acute CHF. Echo in 02/2023 showed LVEF of 45-50% with global hypokinesis. RV not well visualized. He D/c with lasix 20 mg daily  -Coreg 6.25mg  twice daily. - Will hold off on starting Spironolactone or SGLT2 inhibitor right now given soft BP this morning. - Continue to monitor daily weights, strict I/Os, and renal function.  Hypertensive Emergency BP initially 228/122. Now improved with Rx mildly decreased EF .   Hyperlipidemia Lipid panel this admission: Total Cholesterol 225, Triglycerides 110, HDL 67, LDL 136. LDL goal <70 given CAD. - He has been restarted on Lipitor 40mg  daily this admission. - Will need repeat lipid panel and LFTs in 6-8 weeks.  Obstructive Sleep Apnea He has been non-compliant with home CPAP recently. - Encouraged compliance with CPAP.   Will arrange outpatient f/u with Dr Murray Hodgkins to d/c home     For questions or updates, please contact [facility] Please consult [email or web address] for contact info under        Signed, Charlton Haws, MD  05/18/2023, 8:35 AM     I have seen and examined the patient along with Corrin Parker, PA-C .  I have

## 2023-05-19 LAB — LIPOPROTEIN A (LPA): Lipoprotein (a): 27.1 nmol/L (ref <75.0)

## 2023-05-20 ENCOUNTER — Telehealth: Payer: Self-pay | Admitting: Cardiology

## 2023-05-20 NOTE — Telephone Encounter (Signed)
 TOC scheduled for 03/17 at 3:35pm with Eligha Bridegroom, NP per Dr. Eden Emms

## 2023-05-21 NOTE — Telephone Encounter (Signed)
 Left voicemail to return call to office

## 2023-05-23 NOTE — Telephone Encounter (Signed)
 Patient contacted regarding discharge from [facility] on May 18, 2023.  Patient understands to follow up with provider Swinyer, NP on 05/27/23 at 3:35 pm  at [address].  Patient understands discharge instructions? yes   Patient understands medications and regiment? Yes; has an OV with PCP tomorrow will have labs drawn at this visit.  Patient understands to bring all medications to this visit? yes   Pt reports continues to have SOB.  Reports drinks plenty of fluids (about 2-3 bottles of water, a soda and wine)  but does not pee a lot.  Advised to mention to PCP at OV on 05/24/23.

## 2023-05-25 LAB — LAB REPORT - SCANNED
A1c: 5.8
EGFR: 80
PSA, Total: 0.29

## 2023-05-25 NOTE — Progress Notes (Deleted)
 Cardiology Office Note:  .   Date:  05/25/2023  ID:  Jesse Morris, DOB June 01, 1980, MRN [id number] PCP: Norm Salt, PA  [facility] Providers Cardiologist:  Yates Decamp, MD { Click to update primary MD,subspecialty MD or APP then REFRESH:1}   Patient Profile: .      PMH HFrEF/NICM TTE 04/14/2021 EF 45-50, global HK, moderate LVH, normal RV, moderate LAE Echo bubble study 04/16/2021 Negative bubble study TTE 10/21/2021 EF 45-50, global HK, mild LVH, G1DD, normal RV TTE 03/02/2023 EF 45-50, global HK, mild LVH Mitral regurgitation Mild MR on TTE 03/02/2023 Hypertension Morbid obesity Hyperlipidemia Tobacco abuse Sleep apnea Coronary artery disease R/LHC 04/17/2021 Mild LAD to dist LAD 15% Normal PCWP and RA pressure Mildly elevated LVEDP High cardiac output Borderline pulmonary hypertension  Mild aortic dilatation TTE 03/02/2023 Aortic root 42 mm Ascending aorta 39 mm  Initially established with cardiology during admission 04/2021 during admission for dyspnea and hypoxia. O2 sats upon arrival 88%. History of smoking cigarettes and THC. 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 Had recent Influenza A infection. TTE during admission with EF 45-50%, mild LVE, moderate LVH, normal diastolic parameters and RV and no signs of pulmonary HTN. No obvious shunt but bubble study not done. CTA with no PE. Prior diagnosis of sleep apnea but not on CPAP. Troponins were elevated.  Underwent right and left heart cath on 04/17/2021 which revealed minimal CAD with 15% stenosis in mid to distal LAD, mildly elevated LVEDP, high cardiac output, borderline pulm HTN. GDMT for CHF limited by financial concerns. Was discharged with CPAP.   Admission 10/21/2021 for SOB and chest pain. Hypertensive urgency with BP 198/147, lost his job and was not taking his medications. Seen by Dr. Sharyn Lull during admission. Troponins elevated but flat. He was not compliant with CPAP.    Seen by Dr. Jacinto Halim in clinic 08/10/22 to establish cardiology care. Nonischemic cardiomyopathy felt to be secondary to uncontrolled hypertension. Metoprolol was changed to carvedilol and he was advised to discontinue albuterol and Breo Ellipta prescribed for dyspnea felt to be related to CHF rather than COPD or emphysema. Volume status was stable. He was encouraged to work on lifestyle modification and BP control and return in 6-8 weeks for follow-up.   ED visit 02/2023 for worsening shortness of breath and chest pain. BNP elevated and he was admitted for IV diuresis. Troponins consistently elevated, echo revealed LVEF 45-50%, no significant changes from prior echo. He left AMA.   Admission 05/16/2023, seen by cardiology for chest pain and shortness of breath. He reported orthopnea and and exertional SOB. Chest pain described as sharp, 8/10 and radiating down left arm SpO2 in the 80s, BP 228/122. EKG showed sinus rhythm, left atrial enlargement, rate of 90. CXR showed mild cardiomegaly, normal pulmonary vasculature. Troponin 128 >> 127. Reported he was working at [employer]. Had CPAP but it was in storage. LDL was 136 and atorvastatin was resumed. Troponin elevation felt to be 2/2 volume overload and hypertensive urgency. He was discharged on furosemide 20 mg daily, losartan 50 mg daily, and carvedilol 6.25 mg twice daily. Consider Entresto.        History of Present Illness: .   Jesse Morris is a *** 43 y.o. male ***   Discussed the use of AI scribe software for clinical note transcription with the patient, who gave verbal consent to proceed.   ROS: ***       Studies Reviewed: .  Lipoprotein (a)  Date/Time Value Ref Range Status  05/17/2023 03:48 AM 27.1 <75.0 nmol/L Final    Comment:    (NOTE) Note:  Values greater than or equal to 75.0 nmol/L may       indicate an independent risk factor for CHD,       but must be evaluated with caution when applied       to non-Caucasian  populations due to the       influence of genetic factors on Lp(a) across       ethnicities. Performed At: [facility] [address] [place], [place] [id number] Jolene Schimke MD VQ:[id number]      *** Risk Assessment/Calculations:   {Does this patient have ATRIAL FIBRILLATION?[phone]} No BP recorded.  {Refresh Note OR Click here to enter BP  :1}***       Physical Exam:   VS:  There were no vitals taken for this visit.   Wt Readings from Last 3 Encounters:  05/18/23 (!) 330 lb 14.6 oz (150.1 kg)  03/01/23 (!) 325 lb (147.4 kg)  12/19/22 (!) 329 lb (149.2 kg)    GEN: Well nourished, well developed in no acute distress NECK: No JVD; No carotid bruits CARDIAC: ***RRR, no murmurs, rubs, gallops RESPIRATORY:  Clear to auscultation without rales, wheezing or rhonchi  ABDOMEN: Soft, non-tender, non-distended EXTREMITIES:  No edema; No deformity     ASSESSMENT AND PLAN: .    HFrEF/NICM: CAD: Hypertension: OSA: Obesity: Hyperlipidemia LDL goal < 70: LP(a) is not elevated. Lipid panel completed 05/17/23 with total cholesterol 225, triglycerides 110, HDL 67, and LDL 136.     {Are you ordering a CV Procedure (e.g. stress test, cath, DCCV, TEE, etc)?   Press F2        :[id number]}  Disposition:***  Signed, Eligha Bridegroom, NP-C

## 2023-05-27 ENCOUNTER — Ambulatory Visit: Admitting: Nurse Practitioner

## 2023-05-30 NOTE — Progress Notes (Unsigned)
 Cardiology Office Note:  .   Date:  05/31/2023  ID:  Lynda Rainwater, DOB 04-Mar-1981, MRN [id number] PCP: Norm Salt, PA  [facility] Providers Cardiologist:  Yates Decamp, MD {  Patient Profile: .      PMH HFrEF/NICM TTE 04/14/2021 EF 45-50, global HK, moderate LVH, normal RV, moderate LAE Echo bubble study 04/16/2021 Negative bubble study TTE 10/21/2021 EF 45-50, global HK, mild LVH, G1DD, normal RV TTE 03/02/2023 EF 45-50, global HK, mild LVH Mitral regurgitation Mild MR on TTE 03/02/2023 Hypertension Morbid obesity Hyperlipidemia Tobacco abuse Sleep apnea Coronary artery disease R/LHC 04/17/2021 Mild LAD to dist LAD 15% Normal PCWP and RA pressure Mildly elevated LVEDP High cardiac output Borderline pulmonary hypertension  Mild aortic dilatation TTE 03/02/2023 Aortic root 42 mm Ascending aorta 39 mm  Initially established with cardiology during admission 04/2021 during admission for dyspnea and hypoxia. O2 sats upon arrival 88%. History of smoking cigarettes and THC. 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 Had recent Influenza A infection. TTE during admission with EF 45-50%, mild LVE, moderate LVH, normal diastolic parameters and RV and no signs of pulmonary HTN. No obvious shunt but bubble study not done. CTA with no PE. Prior diagnosis of sleep apnea but not on CPAP. Troponins were elevated.  Underwent right and left heart cath on 04/17/2021 which revealed minimal CAD with 15% stenosis in mid to distal LAD, mildly elevated LVEDP, high cardiac output, borderline pulm HTN. GDMT for CHF limited by financial concerns. Was discharged with CPAP.   Admission 10/21/2021 for SOB and chest pain. Hypertensive urgency with BP 198/147, lost his job and was not taking his medications. Seen by Dr. Sharyn Lull during admission. Troponins elevated but flat. He was not compliant with CPAP.   Seen by Dr. Jacinto Halim in clinic 08/10/22 to establish cardiology  care. Nonischemic cardiomyopathy felt to be secondary to uncontrolled hypertension. Metoprolol was changed to carvedilol and he was advised to discontinue albuterol and Breo Ellipta prescribed for dyspnea felt to be related to CHF rather than COPD or emphysema. Volume status was stable. He was encouraged to work on lifestyle modification and BP control and return in 6-8 weeks for follow-up.   ED visit 02/2023 for worsening shortness of breath and chest pain. BNP elevated and he was admitted for IV diuresis. Troponins consistently elevated, echo revealed LVEF 45-50%, no significant changes from prior echo. He left AMA.   Admission 05/16/2023, seen by cardiology for chest pain and shortness of breath. He reported orthopnea and and exertional SOB. Chest pain described as sharp, 8/10 and radiating down left arm SpO2 in the 80s, BP 228/122. EKG showed sinus rhythm, left atrial enlargement, rate of 90. CXR showed mild cardiomegaly, normal pulmonary vasculature. Troponin 128 >> 127. Reported he was working at [employer]. Had CPAP but it was in storage. LDL was 136 and atorvastatin was resumed. Troponin elevation felt to be 2/2 volume overload and hypertensive urgency. He was discharged on furosemide 20 mg daily, losartan 50 mg daily, and carvedilol 6.25 mg twice daily. Consider Entresto.   Today, he presents with a history of hypertension, and CHF was recently hospitalized for chest pain and shortness of breath. During the hospitalization, he had elevated troponin levels, thought to be due to high blood pressure and fluid overload. Since discharge, he reports persistent shortness of breath and fluctuating oxygen saturation levels. He also describes difficulty with physical activity, including walking, due to breathlessness. He has been unable to  work due to his symptoms and has applied for disability. He was unfortunately denied.   The patient also reports weight gain despite being on Lasix for fluid  management. He has been unable to access a prescribed weight loss injection due to insurance coverage issues. 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  The patient has a history of smoking, but quit many years ago. He has a CPAP machine for sleep apnea, but it is currently inaccessible. He also reports significant social and financial stressors, including loss of his apartment and car, and having to give his children to their mother to care for.  Reports no chest pain, pressure, or tightness. No edema, orthopnea, PND. Reports no palpitations.   Discussed the use of AI scribe software for clinical note transcription with the patient, who gave verbal consent to proceed.  Echo with LVEF 45-50 Lung evaluation-desating ? PharmD weightloss  He is gaining weight can't be active BMP done at [facility] 3/14 In pain a lot-shoulders have issues Social work-disability  Entresto-patient assistance        ROS: pertinent ROS in HPI       Studies Reviewed: Marland Kitchen         Lipoprotein (a)  Date/Time Value Ref Range Status  05/17/2023 03:48 AM 27.1 <75.0 nmol/L Final    Comment:    (NOTE) Note:  Values greater than or equal to 75.0 nmol/L may       indicate an independent risk factor for CHD,       but must be evaluated with caution when applied       to non-Caucasian populations due to the       influence of genetic factors on Lp(a) across       ethnicities. Performed At: [facility] [address] [place], [place] [id number] Jolene Schimke MD NF:[id number]           Physical Exam:   VS:  BP 122/88   Pulse 89   Ht 6' (1.829 m)   Wt (!) 339 lb (153.8 kg)   SpO2 93%   BMI 45.98 kg/m    Wt Readings from Last 3 Encounters:  05/31/23 (!) 339 lb (153.8 kg)  05/18/23 (!) 330 lb 14.6 oz (150.1 kg)  03/01/23 (!) 325 lb (147.4 kg)    GEN: Well nourished, well developed in no acute distress NECK: No JVD; No carotid  bruits CARDIAC: RRR, no murmurs, rubs, gallops RESPIRATORY:  Clear to auscultation without rales, wheezing or rhonchi  ABDOMEN: Soft, non-tender, non-distended EXTREMITIES:  No edema; No deformity     ASSESSMENT AND PLAN: .    Non-ischemic cardiomyopathy/HFrEF Non-ischemic cardiomyopathy with EF 45%, dyspnea, and fluid retention. Entresto recommended but cost-prohibitive. Entresto improves cardiac function and quality of life. - Refer to PharmD for [facility] assistance and possible initiation of farxiga - Apply for medical grant for [employer].  Hypertension Significantly elevated BP at 228/122 mmHg. Current regimen partially effective. Pain may contribute to hypertension. - Continue current antihypertensive regimen. - Regular BP monitoring. Much better today however, diastolic is still elevated  Obesity Obesity exacerbating symptoms. Previous attempts for weight loss medication unsuccessful due to insurance. - Refer to weight loss clinic for evaluation and medication assistance.  Sleep apnea Sleep apnea managed with CPAP, currently inaccessible. - Facilitate access to CPAP machine (storage until and lack of transportation)  Social and financial barriers to care Social and financial challenges affecting medication affordability, housing, and  transportation, impacting health management. - Consult with Child psychotherapist for social and financial issues. - Facilitate transportation and medication access through Medicaid and other programs.  Follow-up Ongoing management required for multiple health issues. Labs needed to assess renal function and fluid status. - Request lab work from primary care provider. - Schedule cardiology follow-up in 2-3 months.   Hyperlipidemia LDL goal < 70: LP(a) is not elevated. Lipid panel completed 05/17/23 with total cholesterol 225, triglycerides 110, HDL 67, and LDL 136.       Signed, Sharlene Dory, PA-C

## 2023-05-31 ENCOUNTER — Telehealth: Payer: Self-pay | Admitting: Licensed Clinical Social Worker

## 2023-05-31 ENCOUNTER — Encounter: Payer: Self-pay | Admitting: Physician Assistant

## 2023-05-31 ENCOUNTER — Ambulatory Visit: Attending: Physician Assistant | Admitting: Physician Assistant

## 2023-05-31 VITALS — BP 122/88 | HR 89 | Ht 72.0 in | Wt 339.0 lb

## 2023-05-31 DIAGNOSIS — E66813 Obesity, class 3: Secondary | ICD-10-CM

## 2023-05-31 DIAGNOSIS — I340 Nonrheumatic mitral (valve) insufficiency: Secondary | ICD-10-CM

## 2023-05-31 DIAGNOSIS — I428 Other cardiomyopathies: Secondary | ICD-10-CM

## 2023-05-31 DIAGNOSIS — I5032 Chronic diastolic (congestive) heart failure: Secondary | ICD-10-CM

## 2023-05-31 DIAGNOSIS — I10 Essential (primary) hypertension: Secondary | ICD-10-CM

## 2023-05-31 DIAGNOSIS — Z6841 Body Mass Index (BMI) 40.0 and over, adult: Secondary | ICD-10-CM

## 2023-05-31 NOTE — Progress Notes (Signed)
 Heart and Vascular Care Navigation  05/31/2023  Jesse Morris 06/25/1980 [id number]  Reason for Referral: insurance/housing/finances/transportation/medications  Patient is participating in a Managed Medicaid Plan:Yes  Engaged with patient by telephone for initial visit for Heart and Vascular Care Coordination.                                                                                                   Assessment:        LCSW was able to reach pt at [phone] after appt today with Jesse Hy, PA. Introduced self, role, reason for call. Pt shared he didn't have anything to write with and asked to call me back which he did. He confirmed current address is [address], [place], [address]- he is residing with his aunt. His children reside with his mother now in [place] (near [place]) as he has had financial challenges that led to him losing his apartment and his vehicle. He does have Medicaid coverage at this time.   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 He currently does not pay for rent or utilities. His aunt is allowing him to save his money- he is looking for rental between $700-900/month. He [employer] to and from work; takes [employer] transportation to appts. Receives about $740 in [facility] which he states he has his children use. His employment is physical in nature and he has had a hard time getting things done at work. He was signed out of work this week by his PCP. He applied for disability through social security b/c he doesn't have any disability or other benefits through his current employment. Unfortunately, he received a letter after about 6 months of waiting stating he was denied. Pt very concerned about being able to work moving forward.   LCSW first reviewed Medicaid benefits with pt: encouraged him to use Medicaid for rides to appts, shared that his medications should be no more than $4 and can use mail order and charge account options  through [employer] or [employer]. I shared that there aren't alternatives to this since Medicaid is comprehensive coverage for medications. Also shared there may be additional benefits that can help with other costs such as housing etc.   Pt agreeable to resources for housing from [facility] for Housing and Community Studies to be sent to his email ([email or web address]). I will send resources for food pantries, work transportation information through [employer] and housing/utility assistance. Will send [employer] as well. Pt also will be sent a release of information form for his disability application should he be interested in speaking with [facility] regarding disability next steps (encouraged him to call number on letter to understand if he is eligible for appeal).   I shared verbal encouragement for all that pt is managing and for continuing to make his appts. Pt emotional about everything he has lost and feelings of frustration about not feeling 100% at work. Inquired if he ever had sought support as loss of housing/car and children moving further from home can be stressful. He states he has  support at this time, encouraged him to let us know if he ever is interested in additional supportive resources such as therapy providers.   No additional questions at this time.                         HRT/VAS Care Coordination     Patients Home Cardiology Office [facility]   Outpatient Care Team Social Worker   Social Worker Name: Octavio Graves, [place], [phone]   Living arrangements for the past 2 months Single Family Home   Lives with: Relatives  aunt   Patient Current Insurance Coverage Medicaid   Patient Has Concern With Paying Medical Bills Yes   Patient Concerns With Medical Bills one large outstanding bill that wasnt billed to [facility]   Medical Bill Referrals: have sent message to financial counselor and Medicaid caseworker to ensure coverage dates and any  additional steps needed for pt to complete.   Does Patient Have Prescription Coverage? Yes   Patient Prescription Assistance Programs Other  discussed using charge accounts/medicaid copay waiver at [facility] [employer] CPAP  currently in storage unit   DME Agency NA       Social History:                                                                             SDOH Screenings   Food Insecurity: Food Insecurity Present (05/31/2023)  Housing: High Risk (05/31/2023)  Transportation Needs: Unmet Transportation Needs (05/31/2023)  Utilities: Not At Risk (05/31/2023)  Recent Concern: Utilities - At Risk (05/16/2023)  Financial Resource Strain: Medium Risk (05/31/2023)  Stress: Stress Concern Present (05/31/2023)  Tobacco Use: Medium Risk (05/31/2023)  Health Literacy: Adequate Health Literacy (05/31/2023)    SDOH Interventions: Financial Resources:  Surveyor, quantity Strain Interventions: [employer] Provided (provided food/housing/transportation resources; ROI for [employer] sent as pt denied for disability- note pt currently out of work for a week per PCP; does not currently pay anything for rent/utilities/receives SNAP to supplement) DSS for financial assistance and Tree surgeon for Medical illustrator Insecurity:  Food Insecurity Interventions: [employer] Provided (Environmental manager; fresh mobile market for healthier options; recieves $740 in [employer])  Housing Insecurity:  Housing Interventions: Programmer, applications Provided (housing lists from [employer] for Housing)  Transportation:   Transportation Interventions: [employer] Provided, Associate Professor (has Medicaid transportation; takes Pharmacist, community to work 3rd shift; sent [employer] employment ride info)    Other Care Navigation Interventions:     Provided Pharmacy assistance resources Other (discussed using charge accounts/medicaid copay waiver at [employer])   Patient expressed Mental Health concerns Yes, Referred to:  offered mental health resources- pt states he has a good support system, encouraged him to ask if he needs additional support for current stressors   Follow-up plan:   LCSW mailed pt the following: - food pantry list - fresh mobile markets list - housing lists - utility/rent assistance if aunt needs - encouraged to get CPAP from storage - [employer] employment transportation - ROI for [employer] center/referral - [employer] resource list - emailed medicaid caseworker to ensure no lapse/emailed financial counselor regarding outstanding balance  I  also forwarded email from Earleen Reaper with [facility] for Housing and Community Studies with housing that may be in pt financial preferences.

## 2023-05-31 NOTE — Patient Instructions (Addendum)
 Medication Instructions:  Your physician recommends that you continue on your current medications as directed. Please refer to the Current Medication list given to you today.  *If you need a refill on your cardiac medications before your next appointment, please call your pharmacy*   Lab Work: We will request your recent lab work from you primary care provider. If you have labs (blood work) drawn today and your tests are completely normal, you will receive your results only by: MyChart Message (if you have MyChart) OR A paper copy in the mail If you have any lab test that is abnormal or we need to change your treatment, we will call you to review the results.   Testing/Procedures: NONE   Follow-Up: At [facility], you and your health needs are our priority.  As part of our continuing mission to provide you with exceptional heart care, we have created designated Provider Care Teams.  These Care Teams include your primary Cardiologist (physician) and Advanced Practice Providers (APPs -  Physician Assistants and Nurse Practitioners) who all work together to provide you with the care you need, when you need it.  We recommend signing up for the patient portal called "MyChart".  Sign up information is provided on this After Visit Summary.  MyChart is used to connect with patients for Virtual Visits (Telemedicine).  Patients are able to view lab/test results, encounter notes, upcoming appointments, etc.  Non-urgent messages can be sent to your provider as well.   To learn more about what you can do with MyChart, go to [email or web address].    Your next appointment:   2-3 months  Provider: Jacinto Halim, MD  Other Instructions You have been referred to our Social Worker, Renaee Munda. Someone will be reaching out to you to make an appointment. You have been referred to our Pharmacist for help with weight loss and medication management.    1st Floor: - Lobby - Registration  - Pharmacy   - Lab - Cafe  2nd Floor: - PV Lab - Diagnostic Testing (echo, CT, nuclear med)  3rd Floor: - Vacant  4th Floor: - TCTS (cardiothoracic surgery) - AFib Clinic - Structural Heart Clinic - Vascular Surgery  - Vascular Ultrasound  5th Floor: - HeartCare Cardiology (general and EP) - Clinical Pharmacy for coumadin, hypertension, lipid, weight-loss medications, and med management appointments    Valet parking services will be available as well.

## 2023-06-07 ENCOUNTER — Telehealth (HOSPITAL_BASED_OUTPATIENT_CLINIC_OR_DEPARTMENT_OTHER): Payer: Self-pay | Admitting: Licensed Clinical Social Worker

## 2023-06-07 NOTE — Telephone Encounter (Signed)
 H&V Care Navigation CSW Progress Note  Clinical Social Worker contacted patient by phone to f/u on community resources- pt shares he was able to receive paperwork in the mail and was able to complete ROI with information for [facility]- due to transportation concerns pt sent me a picture of signed form, sent securely to [facility] team along with referral form for assistance with disability appeal or re-application.   Pt also confirmed he received the list of housing from [facility] for Housing to his email. He shares he is out of work again this week and thinks he will lose his job. We discussed that there will be delay to his application being processed and that he should possibly look into unemployment benefits if he does lose employment. Still staying at his aunts house without issue.   Patient is participating in a Managed Medicaid Plan:  Yes- [employer]  SDOH Screenings   Food Insecurity: Food Insecurity Present (05/31/2023)  Housing: High Risk (05/31/2023)  Transportation Needs: Unmet Transportation Needs (05/31/2023)  Utilities: Not At Risk (05/31/2023)  Recent Concern: Utilities - At Risk (05/16/2023)  Financial Resource Strain: Medium Risk (05/31/2023)  Stress: Stress Concern Present (05/31/2023)  Tobacco Use: Medium Risk (05/31/2023)  Health Literacy: Adequate Health Literacy (05/31/2023)    Jesse Morris, MSW, LCSW Clinical Social Worker II [facility] Health Heart/Vascular Care Navigation  [phone]- work cell phone (preferred) [phone]- desk phone

## 2023-06-12 ENCOUNTER — Ambulatory Visit: Admitting: Internal Medicine

## 2023-06-12 ENCOUNTER — Encounter: Payer: Self-pay | Admitting: Internal Medicine

## 2023-06-12 VITALS — BP 118/90 | HR 86 | Ht 72.0 in | Wt 342.0 lb

## 2023-06-12 DIAGNOSIS — R0602 Shortness of breath: Secondary | ICD-10-CM

## 2023-06-12 DIAGNOSIS — J302 Other seasonal allergic rhinitis: Secondary | ICD-10-CM

## 2023-06-12 DIAGNOSIS — R0902 Hypoxemia: Secondary | ICD-10-CM

## 2023-06-12 DIAGNOSIS — R059 Cough, unspecified: Secondary | ICD-10-CM | POA: Diagnosis not present

## 2023-06-12 DIAGNOSIS — Z8679 Personal history of other diseases of the circulatory system: Secondary | ICD-10-CM

## 2023-06-12 DIAGNOSIS — Z8669 Personal history of other diseases of the nervous system and sense organs: Secondary | ICD-10-CM | POA: Diagnosis not present

## 2023-06-12 LAB — NITRIC OXIDE: Nitric Oxide: 6

## 2023-06-12 NOTE — Progress Notes (Signed)
 OV 06/12/2023  Subjective:  Patient ID: Jesse Morris, male , DOB: Nov 24, 1980 , age 43 y.o. , MRN: [id number] , ADDRESS: [address] [place] [address] PCP Norm Salt, PA Patient Care Team: Norm Salt, [place] as PCP - General (Physician Assistant) Yates Decamp, MD as PCP - Cardiology (Cardiology)  This Provider for this visit: Treatment Team:  Attending Provider: Kalman Shan, MD    06/12/2023 -   Chief Complaint  Patient presents with   Pulmonary Consult    Referred by [name], PA. Pt c/o SOB for the past 4 months. He feels SOB all the time, with exertion or at rest. He has OSA and was started on CPAP per PCP but he is not using now. He has occ cough with clear sputum and wheezing.      HPI Jesse Morris 43 y.o. -new consult referred by primary care physician.  He has morbid obesity.  He works third shift at [employer].  He he has a previous urine tox positive for marijuana.  A year ago he got ruled out for pulmonary embolism with a CT angiogram.  He got admitted in the first week of March 2025 with respiratory failure deemed secondary to systolic heart failure.  After that is followed up with cardiology.  He has history of sleep apnea but his CPAP machine is in the storage and is not using it.  Is not followed up with a sleep doctor.  He does not know who the sleep doctor is.  After discharge he is gotten better but he still has shortness of breath walking short distances which has been going on for at least few months and getting worse.  He says that even at rest his pulse ox in the 80s and 90s.  He noted that he was hypertensive during the hospitalization.  Now with the pollen allergies he is got some cough going on.   - He quit smoking in 2015 - He does admit to drinking 2 drinks alcohol daily - Works third shift at [employer]     FENO:  6 ppb normal today.   Did desaturate with exertion   Simple office walk 224 (66+46 x 2) feet Pod A at [facility] x   3 laps goal with forehead probe 06/12/2023    O2 used ra   Number laps completed Did only 8 stit and stand   Comments about pace Took 54 sec   Resting Pulse Ox/HR 96% and 87/min   Final Pulse Ox/HR 84% and 94/min   Desaturated </= 88% yes   Desaturated <= 3% points yes   Got Tachycardic >/= 90/min yes   Symptoms at end of test Dyspneic, tingling   Miscellaneous comments x     Walked 200 feet x 1 laps - end pulse ox 84% on RA Waked 200 feet x 1 lap: end pulse ox 95% on 2L  Eosinophils are high    Latest Reference Range & Units 04/13/21 18:57 04/15/21 09:56 04/16/21 04:17 04/17/21 03:42 04/18/21 04:18 04/19/21 04:00 10/21/21 00:57 05/16/23 05:11  Eosinophils Absolute 0.0 - 0.5 K/uL 0.2 0.2 0.3 0.3 0.3 0.2 0.3 0.3      CLINICAL DATA:  Pulmonary embolism (PE) suspected, neg D-dimer   EXAM: CT ANGIOGRAPHY CHEST WITH CONTRAST   TECHNIQUE: Multidetector CT imaging of the chest was performed using the standard protocol during bolus administration of intravenous contrast. Multiplanar CT image reconstructions and MIPs were obtained to evaluate the vascular anatomy.  RADIATION DOSE REDUCTION: This exam was performed according to the departmental dose-optimization program which includes automated exposure control, adjustment of the mA and/or kV according to patient size and/or use of iterative reconstruction technique.   CONTRAST:  OMNIPAQUE IOHEXOL 350 MG/ML SOLN   COMPARISON:  None.   FINDINGS: Cardiovascular: Mild motion artifact at the lung bases slightly limits the quality of the examination. The examination, however, is still diagnostic. No intraluminal filling defect is identified to suggest acute pulmonary embolism. Central pulmonary arteries are of normal caliber. No significant coronary artery calcification. Global cardiac size within normal limits. No pericardial effusion. The thoracic aorta is unremarkable.   Mediastinum/Nodes: No enlarged mediastinal,  hilar, or axillary lymph nodes. Thyroid gland, trachea, and esophagus demonstrate no significant findings.   Lungs/Pleura: Lungs are clear. No pleural effusion or pneumothorax.   Upper Abdomen: No acute abnormality.   Musculoskeletal: Osseous structures are age-appropriate. No acute bone abnormality.   Review of the MIP images confirms the above findings.   IMPRESSION: No pulmonary embolism.  No acute intrathoracic pathology identified.     Electronically Signed   By: Helyn Numbers M.D.   On: 04/13/2021 22:32 PFT      No data to display            Latest Reference Range & Units 07/19/16 12:49 04/13/21 18:57 04/13/21 19:20 04/14/21 05:48 04/15/21 09:56 04/16/21 04:17 04/17/21 03:42 04/17/21 10:44 04/17/21 10:45 04/18/21 04:18 04/19/21 04:00 10/20/21 17:39 10/21/21 00:57 03/01/23 14:44 05/15/23 22:11 05/16/23 05:11 05/18/23 08:10  Hemoglobin 13.0 - 17.0 g/dL 13.2 44.0 (L) 10.2 72.5 (L) 11.3 (L) 12.0 (L) 10.8 (L) 11.9 (L) 11.9 (L) 10.8 (L) 12.1 (L) 12.7 (L) 12.4 (L) 13.8 13.5 12.8 (L) 14.9  (L): Data is abnormally low  LAB RESULTS last 96 hours No results found.       has a past medical history of CHF (congestive heart failure) (HCC), Hypertension, Obesity, and Sleep apnea.   reports that he quit smoking about 10 years ago. His smoking use included cigarettes. He started smoking about 25 years ago. He has a 3.8 pack-year smoking history. He has never used smokeless tobacco.  Past Surgical History:  Procedure Laterality Date   HAND TENDON SURGERY     RIGHT/LEFT HEART CATH AND CORONARY ANGIOGRAPHY N/A 04/17/2021   Procedure: RIGHT/LEFT HEART CATH AND CORONARY ANGIOGRAPHY;  Surgeon: Laurey Morale, MD;  Location: [facility] INVASIVE CV LAB;  Service: Cardiovascular;  Laterality: N/A;    No Known Allergies   There is no immunization history on file for this patient.  Family History  Problem Relation Age of Onset   Asthma Maternal Aunt      Current Outpatient  Medications:    atorvastatin (LIPITOR) 40 MG tablet, Take 1 tablet (40 mg total) by mouth daily., Disp: 90 tablet, Rfl: 0   carvedilol (COREG) 6.25 MG tablet, Take 1 tablet (6.25 mg total) by mouth 2 (two) times daily with a meal., Disp: 180 tablet, Rfl: 0   furosemide (LASIX) 20 MG tablet, Take 1 tablet (20 mg total) by mouth daily., Disp: 90 tablet, Rfl: 0   losartan (COZAAR) 50 MG tablet, Take 1 tablet (50 mg total) by mouth daily., Disp: 90 tablet, Rfl: 0      Objective:   Vitals:   06/12/23 1535  BP: (!) 118/90  Pulse: 86  SpO2: 93%  Weight: (!) 342 lb (155.1 kg)  Height: 6' (1.829 m)    Estimated body mass index is 46.38 kg/m as calculated  from the following:   Height as of this encounter: 6' (1.829 m).   Weight as of this encounter: 342 lb (155.1 kg).  @WEIGHTCHANGE @  [employer]   06/12/23 1535  Weight: (!) 342 lb (155.1 kg)     Physical Exam   General: No distress. obese O2 at rest: no Cane present: no Sitting in wheel chair: no Frail: no Obese: no Neuro: Alert and Oriented x 3. GCS 15. Speech normal Psych: Pleasant Resp:  Barrel Chest - no.  Wheeze - no, Crackles - no, No overt respiratory distress CVS: Normal heart sounds. Murmurs - no Ext: Stigmata of Connective Tissue Disease - no HEENT: Normal upper airway. PEERL +. No post nasal drip        Assessment:       ICD-10-CM   1. Shortness of breath  R06.02 Nitric oxide    Pulmonary function test    CT Chest High Resolution    Perennial allergen profile IgE    Ambulatory referral to Pulmonology    D-Dimer, Quantitative    2. Cough, unspecified type  R05.9 Nitric oxide    Pulmonary function test    CT Chest High Resolution    Perennial allergen profile IgE    Ambulatory referral to Pulmonology    D-Dimer, Quantitative    3. History of chronic congestive heart failure  Z86.79 Pulmonary function test    CT Chest High Resolution    Perennial allergen profile IgE    Ambulatory referral to  Pulmonology    D-Dimer, Quantitative    4. History of sleep apnea  Z86.69 Pulmonary function test    CT Chest High Resolution    Perennial allergen profile IgE    Ambulatory referral to Pulmonology    D-Dimer, Quantitative    5. Seasonal allergies  J30.2 Pulmonary function test    CT Chest High Resolution    Perennial allergen profile IgE    Ambulatory referral to Pulmonology    D-Dimer, Quantitative    6. Exercise hypoxemia  R09.02 D-Dimer, Quantitative         Plan:     Patient Instructions     ICD-10-CM   1. Shortness of breath  R06.02 Nitric oxide    2. Cough, unspecified type  R05.9 Nitric oxide    3. History of chronic congestive heart failure  Z86.79     4. History of sleep apnea  Z86.69       Reasons for shortness of breath very likely heart failure, sleep apnea and also weight and physical deconditioning.  But we need to rule out other possible causes such as any intrinsic lung disease or airway disease such as asthma  Plan - Do full pulmonary function test - Do high-resolution CT chest - Do  RAST allergy panel - check d-dimer -Refer sleep doctor in our office - start potable o2 - 2 LNC  Follow-up - Sleep doctor referral - Return to see nurse practitioner in 8 weeks or so   FOLLOWUP Return in about 8 weeks (around 08/07/2023) for with any of the APPS.    SIGNATURE    Dr. Kalman Shan, M.D., F.C.C.P,  Pulmonary and Critical Care Medicine Staff Physician, [facility] Director - Interstitial Lung Disease  Program  Pulmonary Fibrosis [facility] at [facility] [place], [place], [address]  Pager: [phone], If no answer or between  15:00h - 7:00h: call [phone] Telephone: [phone]  4:26 PM 06/12/2023

## 2023-06-12 NOTE — Patient Instructions (Addendum)
 ICD-10-CM   1. Shortness of breath  R06.02 Nitric oxide    2. Cough, unspecified type  R05.9 Nitric oxide    3. History of chronic congestive heart failure  Z86.79     4. History of sleep apnea  Z86.69       Reasons for shortness of breath very likely heart failure, sleep apnea and also weight and physical deconditioning.  But we need to rule out other possible causes such as any intrinsic lung disease or airway disease such as asthma  Plan - Do full pulmonary function test - Do high-resolution CT chest - Do  RAST allergy panel - check d-dimer -Refer sleep doctor in our office - start potable o2 - 2 LNC  Follow-up - Sleep doctor referral - Return to see nurse practitioner in 8 weeks or so

## 2023-06-14 ENCOUNTER — Telehealth: Payer: Self-pay | Admitting: Licensed Clinical Social Worker

## 2023-06-14 NOTE — Telephone Encounter (Addendum)
 H&V Care Navigation CSW Progress Note  Clinical Social Worker contacted patient by phone to f/u on [facility] referral. Was able to reach pt this morning at [phone]. Shared he did receive a call that he would have a phone hearing this month, unsure of exact date. LCSW will f/u next week to see if any additional updates at that time. Remain available, encouraged pt to call me as needed.  Patient is participating in a Managed Medicaid Plan:  Yes- [employer]  SDOH Screenings   Food Insecurity: Food Insecurity Present (05/31/2023)  Housing: High Risk (05/31/2023)  Transportation Needs: Unmet Transportation Needs (05/31/2023)  Utilities: Not At Risk (05/31/2023)  Recent Concern: Utilities - At Risk (05/16/2023)  Financial Resource Strain: Medium Risk (05/31/2023)  Stress: Stress Concern Present (05/31/2023)  Tobacco Use: Medium Risk (06/12/2023)  Health Literacy: Adequate Health Literacy (05/31/2023)    Octavio Graves, MSW, LCSW Clinical Social Worker II [facility] Health Heart/Vascular Care Navigation  [phone]- work cell phone (preferred) [phone]- desk phone

## 2023-07-10 ENCOUNTER — Telehealth: Payer: Self-pay | Admitting: Licensed Clinical Social Worker

## 2023-07-11 NOTE — Telephone Encounter (Signed)
 H&V Care Navigation CSW Progress Note  Clinical Social Worker contacted patient by phone to f/u on patient referral to resources. Confirmed he had interview with [facility] and completed needed paperwork. Still intermittently calling out of work due to his health. Has moved to his own apartment from his aunt's house. Updated address. We discussed that disability takes time to process and he has limited financial support for rent/utilities and has been using savings at this time. Will send community resources but encouraged pt to think if this is going to be the most sustainable option for him at this time. Will f/u with pt to provide further resources as able. Encouraged him to call me as needed.   Patient is participating in a Managed Medicaid Plan:  Yes  SDOH Screenings   Food Insecurity: Food Insecurity Present (05/31/2023)  Housing: High Risk (05/31/2023)  Transportation Needs: Unmet Transportation Needs (05/31/2023)  Utilities: Not At Risk (05/31/2023)  Recent Concern: Utilities - At Risk (05/16/2023)  Financial Resource Strain: Medium Risk (05/31/2023)  Stress: Stress Concern Present (05/31/2023)  Tobacco Use: Medium Risk (06/12/2023)  Health Literacy: Adequate Health Literacy (05/31/2023)    Jesse Morris, MSW, LCSW Clinical Social Worker II [facility] Health Heart/Vascular Care Navigation  [phone]- work cell phone (preferred)

## 2023-07-25 ENCOUNTER — Ambulatory Visit: Attending: Pharmacist | Admitting: Pharmacist

## 2023-07-25 NOTE — Progress Notes (Deleted)
 Patient ID: Jesse Morris                 DOB: 09/15/1980                    MRN: [id number]     HPI: Jesse Morris is a 43 y.o. male patient referred to pharmacy clinic by *** to initiate GLP1-RA therapy. PMH is significant for ***, and obesity. Most recent BMI ***.  Baseline weight and BMI: *** Current weight and BMI: *** Current meds that affect weight: ****  Sleep apnea with AHI 59.9  *** If diabetic and on insulin/sulfonylurea, can consider reducing dose to reduce risk of hypoglycemia  *** Follow-up visit  Assess % weight loss Assess adverse effects Missed doses  Diet:   Exercise:   Family History:   Social History:   Labs: Lab Results  Component Value Date   HGBA1C 5.4 04/17/2021    Wt Readings from Last 1 Encounters:  06/12/23 (!) 342 lb (155.1 kg)    BP Readings from Last 1 Encounters:  06/12/23 (!) 118/90   Pulse Readings from Last 1 Encounters:  06/12/23 86       Component Value Date/Time   CHOL 225 (H) 05/17/2023 0348   TRIG 110 05/17/2023 0348   HDL 67 05/17/2023 0348   CHOLHDL 3.4 05/17/2023 0348   VLDL 22 05/17/2023 0348   LDLCALC 136 (H) 05/17/2023 0348    Past Medical History:  Diagnosis Date   CHF (congestive heart failure) (HCC)    Hypertension    Obesity    Sleep apnea     Current Outpatient Medications on File Prior to Visit  Medication Sig Dispense Refill   atorvastatin  (LIPITOR) 40 MG tablet Take 1 tablet (40 mg total) by mouth daily. 90 tablet 0   carvedilol  (COREG ) 6.25 MG tablet Take 1 tablet (6.25 mg total) by mouth 2 (two) times daily with a meal. 180 tablet 0   furosemide  (LASIX ) 20 MG tablet Take 1 tablet (20 mg total) by mouth daily. 90 tablet 0   losartan  (COZAAR ) 50 MG tablet Take 1 tablet (50 mg total) by mouth daily. 90 tablet 0   No current facility-administered medications on file prior to visit.    No Known Allergies   Assessment/Plan:  1. Weight loss - Patient has not met goal of at least 5% of body  weight loss with comprehensive lifestyle modifications alone in the past 3-6 months. Pharmacotherapy is appropriate to pursue as augmentation. Will start ***. Confirmed patient not ***pregnant and no personal or family history of medullary thyroid carcinoma (MTC) or Multiple Endocrine Neoplasia syndrome type 2 (MEN 2). Injection technique reviewed at today's visit.  Advised patient on common side effects including nausea, diarrhea, dyspepsia, decreased appetite, and fatigue. Counseled patient on reducing meal size and how to titrate medication to minimize side effects. Counseled patient to call if intolerable side effects or if experiencing dehydration, abdominal pain, or dizziness. Patient will adhere to dietary modifications and will target at least 150 minutes of moderate intensity exercise weekly.   Follow up in 1 month via telephone for tolerability update and dose titration.

## 2023-07-31 ENCOUNTER — Other Ambulatory Visit: Payer: Self-pay | Admitting: Internal Medicine

## 2023-08-07 ENCOUNTER — Ambulatory Visit: Admitting: Nurse Practitioner

## 2023-08-07 ENCOUNTER — Ambulatory Visit: Admitting: Cardiology

## 2023-08-07 ENCOUNTER — Encounter (HOSPITAL_BASED_OUTPATIENT_CLINIC_OR_DEPARTMENT_OTHER)

## 2023-08-08 ENCOUNTER — Ambulatory Visit (INDEPENDENT_AMBULATORY_CARE_PROVIDER_SITE_OTHER): Admitting: Nurse Practitioner

## 2023-08-08 ENCOUNTER — Ambulatory Visit (INDEPENDENT_AMBULATORY_CARE_PROVIDER_SITE_OTHER): Admitting: Internal Medicine

## 2023-08-08 ENCOUNTER — Ambulatory Visit: Attending: Cardiology | Admitting: Cardiology

## 2023-08-08 ENCOUNTER — Encounter (HOSPITAL_BASED_OUTPATIENT_CLINIC_OR_DEPARTMENT_OTHER): Payer: Self-pay | Admitting: Nurse Practitioner

## 2023-08-08 VITALS — BP 138/84 | HR 92 | Ht 72.0 in | Wt 344.0 lb

## 2023-08-08 DIAGNOSIS — J3089 Other allergic rhinitis: Secondary | ICD-10-CM

## 2023-08-08 DIAGNOSIS — I5020 Unspecified systolic (congestive) heart failure: Secondary | ICD-10-CM | POA: Diagnosis not present

## 2023-08-08 DIAGNOSIS — J984 Other disorders of lung: Secondary | ICD-10-CM

## 2023-08-08 DIAGNOSIS — R0609 Other forms of dyspnea: Secondary | ICD-10-CM

## 2023-08-08 DIAGNOSIS — E66813 Obesity, class 3: Secondary | ICD-10-CM

## 2023-08-08 DIAGNOSIS — J9611 Chronic respiratory failure with hypoxia: Secondary | ICD-10-CM

## 2023-08-08 DIAGNOSIS — Z8679 Personal history of other diseases of the circulatory system: Secondary | ICD-10-CM

## 2023-08-08 DIAGNOSIS — J4540 Moderate persistent asthma, uncomplicated: Secondary | ICD-10-CM | POA: Diagnosis not present

## 2023-08-08 DIAGNOSIS — R0602 Shortness of breath: Secondary | ICD-10-CM

## 2023-08-08 DIAGNOSIS — J4530 Mild persistent asthma, uncomplicated: Secondary | ICD-10-CM

## 2023-08-08 DIAGNOSIS — G4733 Obstructive sleep apnea (adult) (pediatric): Secondary | ICD-10-CM

## 2023-08-08 DIAGNOSIS — R059 Cough, unspecified: Secondary | ICD-10-CM

## 2023-08-08 DIAGNOSIS — Z8669 Personal history of other diseases of the nervous system and sense organs: Secondary | ICD-10-CM

## 2023-08-08 DIAGNOSIS — J302 Other seasonal allergic rhinitis: Secondary | ICD-10-CM

## 2023-08-08 LAB — PULMONARY FUNCTION TEST
DL/VA % pred: 142 %
DL/VA: 6.51 ml/min/mmHg/L
DLCO cor % pred: 98 %
DLCO cor: 32.07 ml/min/mmHg
DLCO unc % pred: 98 %
DLCO unc: 32.07 ml/min/mmHg
FEF 25-75 Post: 3.98 L/s
FEF 25-75 Pre: 1.76 L/s
FEF2575-%Change-Post: 125 %
FEF2575-%Pred-Post: 97 %
FEF2575-%Pred-Pre: 43 %
FEV1-%Change-Post: 24 %
FEV1-%Pred-Post: 65 %
FEV1-%Pred-Pre: 52 %
FEV1-Post: 2.88 L
FEV1-Pre: 2.31 L
FEV1FVC-%Change-Post: 13 %
FEV1FVC-%Pred-Pre: 93 %
FEV6-%Change-Post: 9 %
FEV6-%Pred-Post: 62 %
FEV6-%Pred-Pre: 57 %
FEV6-Post: 3.42 L
FEV6-Pre: 3.11 L
FEV6FVC-%Pred-Post: 102 %
FEV6FVC-%Pred-Pre: 102 %
FVC-%Change-Post: 9 %
FVC-%Pred-Post: 61 %
FVC-%Pred-Pre: 55 %
FVC-Post: 3.42 L
FVC-Pre: 3.11 L
Post FEV1/FVC ratio: 84 %
Post FEV6/FVC ratio: 100 %
Pre FEV1/FVC ratio: 74 %
Pre FEV6/FVC Ratio: 100 %
RV % pred: 87 %
RV: 1.73 L
TLC % pred: 70 %
TLC: 5.18 L

## 2023-08-08 MED ORDER — TRELEGY ELLIPTA 100-62.5-25 MCG/ACT IN AEPB: 1.0000 | INHALATION_SPRAY | Freq: Every day | RESPIRATORY_TRACT | 5 refills | Status: DC

## 2023-08-08 MED ORDER — TRELEGY ELLIPTA 100-62.5-25 MCG/ACT IN AEPB
1.0000 | INHALATION_SPRAY | Freq: Every day | RESPIRATORY_TRACT | Status: DC
Start: 2023-08-08 — End: 2023-09-19

## 2023-08-08 MED ORDER — ALBUTEROL SULFATE HFA 108 (90 BASE) MCG/ACT IN AERS: 2.0000 | INHALATION_SPRAY | Freq: Four times a day (QID) | RESPIRATORY_TRACT | 2 refills | Status: DC | PRN

## 2023-08-08 NOTE — Progress Notes (Unsigned)
 @Patient  ID: Jesse Morris, male    DOB: [birth year], 43 y.o.   MRN: [id number]  Chief Complaint  Patient presents with   Follow-up    Shortness of breath    Referring provider: Maire Scot, MD  HPI: 43 year old male, former smoker followed for dyspnea, chronic cough, and chronic respiratory failure.  He is a patient of Dr. Mardell Shade and last seen in office 06/12/2023.  Past medical history significant for HFrEF, NICM, CAD, hypertension, pulmonary hypertension, normocytic anemia, obesity, OSA.   TEST/EVENTS:  02/2023 echo: EF 45 to 50%.  Global hypokinesis.  LVH.  Unable to measure PASP.  Mild MR.  Aortic dilatation 05/16/2023 D-dimer negative 05/16/2023 eos 300 06/12/2023 FeNO 6 ppb 06/12/2023 exertional walk test  >> desaturated to 84% on room air; corrected with 2 L/min supplemental O2 08/08/2023 PFT: FVC 55, FEV1 52, ratio 84, TLC 70, DLCO 98. Positive BD (24%)  06/12/2023: OV with Dr. Bertrum Brodie.  New consult referred by primary care physician.  Morbid obesity.  Having issues with shortness of breath.  Was admitted first week in March 2025 with respiratory failure deemed secondary to his systolic heart failure.  After that has followed up with cardiology.  Does have a history of sleep apnea but not using CPAP.  After discharge, has gotten better but still experiences shortness of breath walking short distances.  Also feels like with the pollen he is starting to cough more. FeNO 6 ppb.  Shortness of breath are likely related to heart failure, sleep apnea and also weight with physical deconditioning.  Rule out interstitial lung disease or airway disease such as asthma.  PFT, HRCT chest, RAST allergy panel ordered.  Start portable O2 at 2 L/min with activity.  Sleep consult.  08/08/2023: Today - follow up/sleep consult Discussed the use of AI scribe software for clinical note transcription with the patient, who gave verbal consent to proceed.  History of Present Illness   Jesse Morris is a 43  year old male presents with shortness of breath and breathing difficulties.  He had a PFT today that revealed a moderate restrictive defect with normal diffusion capacity, reduced ERV and positive bronchodilator response (24% change).  No formal obstruction.  He experiences shortness of breath that worsens with seasonal changes and exposure to pollen.  He also gets short winded just sitting at rest sometimes and feels like he has to take a big deep breath.  Symptoms overall are stable compared to his last visit.  His cough has improved some.  He experiences wheezing and chest tightness.  He has used inhalers in the past but does not recall that he is being very effective.  Thinks he tried Breo and Symbicort.  Did not do these for very long periods of time.  No fevers, chills, hemoptysis or increased lower extremity swelling.  Does have issues with orthopnea and PND.  He has a history of sleep apnea, diagnosed during the COVID pandemic through a home study. He often feels like he is not getting enough air with his CPAP machine and and therefore has to remove it.  He is not currently using it.  Does feel tired all the time.  Snores loudly.  Has been told he stops breathing.  He does not drive and denies issues with morning headaches, sleepwalking. Not using sleep medications. He consumes caffeine in the mornings.   He is currently on Wegovy for weight management. He had to stop working due to breathing difficulties. He uses a portable oxygen machine  and reports that his oxygen levels drop when he is active but seem to do okay on the oxygen; although, he does not routinely use this. He did not have his oxygen with him today.    Goes to sleep around 3 am. Takes a few hours to fall asleep. Wakes multiple times a night. Wake time varies. He has had a sleep study before but had difficulties tolerating CPAP. Felt like he couldn't breath on it. Never had an in lab study. Drinks 2 beers a day. He's not currently  working.   No Known Allergies   There is no immunization history on file for this patient.  Past Medical History:  Diagnosis Date   CHF (congestive heart failure) (HCC)    Hypertension    Obesity    Sleep apnea     Tobacco History: Social History   Tobacco Use  Smoking Status Former   Current packs/day: 0.00   Average packs/day: 0.3 packs/day for 15.0 years (3.8 ttl pk-yrs)   Types: Cigarettes   Start date: 2000   Quit date: 2015   Years since quitting: 10.4  Smokeless Tobacco Never   Counseling given: Not Answered   Outpatient Medications Prior to Visit  Medication Sig Dispense Refill   atorvastatin  (LIPITOR) 40 MG tablet Take 1 tablet (40 mg total) by mouth daily. 90 tablet 0   carvedilol  (COREG ) 6.25 MG tablet Take 1 tablet (6.25 mg total) by mouth 2 (two) times daily with a meal. 180 tablet 0   furosemide  (LASIX ) 20 MG tablet Take 1 tablet (20 mg total) by mouth daily. 90 tablet 0   losartan  (COZAAR ) 50 MG tablet Take 1 tablet (50 mg total) by mouth daily. 90 tablet 0   No facility-administered medications prior to visit.     Review of Systems:   Constitutional: No weight loss or gain, night sweats, fevers, chills, or lassitude. +fatigue  HEENT: No headaches, difficulty swallowing, tooth/dental problems, or sore throat. No sneezing, itching, ear ache +occasional nasal congestion, post nasal drip CV:  +orthopnea, PND. No chest pain, swelling in lower extremities, anasarca, dizziness, palpitations, syncope Resp: +snoring, witnessed apneas, shortness of breath with exertion and at rest; occasional cough; wheeze. No excess mucus or change in color of mucus. No hemoptysis.  No chest wall deformity GI:  No heartburn, indigestion, abdominal pain, nausea, vomiting, diarrhea, change in bowel habits, loss of appetite, bloody stools.  GU: No dysuria, change in color of urine, urgency or frequency.  No flank pain, no hematuria  Skin: No rash, lesions, ulcerations MSK:  No  joint pain or swelling.  Neuro: No dizziness or lightheadedness.  Psych: No depression or anxiety. Mood stable. +sleep disturbance    Physical Exam:  BP 138/84   Pulse 92   Ht 6' (1.829 m)   Wt (!) 344 lb (156 kg)   SpO2 100%   BMI 46.65 kg/m   GEN: Pleasant, interactive, well-kempt; morbidly obese; in no acute distress HEENT:  Normocephalic and atraumatic. PERRLA. Sclera white. Nasal turbinates pink, moist and patent bilaterally. No rhinorrhea present. Oropharynx pink and moist, without exudate or edema. No lesions, ulcerations, or postnasal drip.  NECK:  Supple w/ fair ROM. No JVD present. Normal carotid impulses w/o bruits. Thyroid symmetrical with no goiter or nodules palpated. No lymphadenopathy.   CV: RRR, no m/r/g, no peripheral edema. Pulses intact, +2 bilaterally. No cyanosis, pallor or clubbing. PULMONARY:  Unlabored, regular breathing. Clear bilaterally A&P w/o wheezes/rales/rhonchi. No accessory muscle use.  GI: BS present  and normoactive. Soft, non-tender to palpation. No organomegaly or masses detected.  MSK: No erythema, warmth or tenderness. Cap refil <2 sec all extrem. No deformities or joint swelling noted.  Neuro: A/Ox3. No focal deficits noted.   Skin: Warm, no lesions or rashe Psych: Normal affect and behavior. Judgement and thought content appropriate.     Lab Results:  CBC    Component Value Date/Time   WBC 6.7 05/18/2023 0810   RBC 5.04 05/18/2023 0810   HGB 14.9 05/18/2023 0810   HCT 49.7 05/18/2023 0810   PLT 286 05/18/2023 0810   MCV 98.6 05/18/2023 0810   MCH 29.6 05/18/2023 0810   MCHC 30.0 05/18/2023 0810   RDW 15.9 (H) 05/18/2023 0810   LYMPHSABS 1.6 05/16/2023 0511   MONOABS 0.5 05/16/2023 0511   EOSABS 0.3 05/16/2023 0511   BASOSABS 0.0 05/16/2023 0511    BMET    Component Value Date/Time   NA 138 05/18/2023 0423   K 3.8 05/18/2023 0423   CL 98 05/18/2023 0423   CO2 27 05/18/2023 0423   GLUCOSE 100 (H) 05/18/2023 0423   BUN 19  05/18/2023 0423   CREATININE 1.05 05/18/2023 0423   CALCIUM  8.7 (L) 05/18/2023 0423   GFRNONAA >60 05/18/2023 0423   GFRAA >60 07/19/2016 1249    BNP    Component Value Date/Time   BNP 49.1 05/15/2023 2211     Imaging:  No results found.  Administration History     None          Latest Ref Rng & Units 08/08/2023    2:45 PM  PFT Results  FVC-Pre L 3.11  P  FVC-Predicted Pre % 55  P  FVC-Post L 3.42  P  FVC-Predicted Post % 61  P  Pre FEV1/FVC % % 74  P  Post FEV1/FCV % % 84  P  FEV1-Pre L 2.31  P  FEV1-Predicted Pre % 52  P  FEV1-Post L 2.88  P  DLCO uncorrected ml/min/mmHg 32.07  P  DLCO UNC% % 98  P  DLCO corrected ml/min/mmHg 32.07  P  DLCO COR %Predicted % 98  P  DLVA Predicted % 142  P  TLC L 5.18  P  TLC % Predicted % 70  P  RV % Predicted % 87  P    P Preliminary result    Lab Results  Component Value Date   NITRICOXIDE 6 06/12/2023        Assessment & Plan:   Shortness of breath Suspect his DOE is multifactorial related to HFrEF, possible obesity hypoventilation syndrome, possible asthma, body habitus and deconditioning.  Pulmonary function testing today with moderate restriction and reduced ERV, consistent with body habitus.  No diffusion defect.  Does have reversibility in his FEV1 postbronchodilator, consistent with possible reactive airway/asthma component.  Will trial him on triple therapy with Trelegy.  Side effect profile reviewed.  Teach back performed.  Reassess response at follow-up.  Encouraged him to be consistent with use.  Provided with albuterol  HFA for rescue and reviewed difference in maintenance versus rescue therapy. Check allergen marker, CBC with differential to assess peripheral eosinophils and BNP today.  Encouraged to work on graded exercise and healthy weight loss measures. Reviewed risks of untreated hypoxia and importance of compliance with oxygen therapy  Patient Instructions  Albuterol  inhaler 2 puffs every 6 hours  as needed for shortness of breath or wheezing. Notify if symptoms persist despite rescue inhaler/neb use. Trial Trelegy 1 puff daily. Brush tongue  and rinse mouth afterwards   Call your heart doctor about the refills on your lasix  and carvedilol   Attend high resolution CT chest as scheduled   Labs today  Given your symptoms and history, I am concerned that you still have sleep disordered breathing with sleep apnea and possible obesity hypoventilation syndrome. You will need a sleep study for further evaluation. Someone will contact you to schedule this.   We discussed how untreated sleep apnea puts an individual at risk for cardiac arrhthymias, pulm HTN, DM, stroke and increases their risk for daytime accidents. We also briefly reviewed treatment options including weight loss, side sleeping position, oral appliance, CPAP therapy or referral to ENT for possible surgical options  Use caution when driving and pull over if you become sleepy.  Follow up in 6 weeks with Katie Kehinde Bowdish,NP to go over sleep study results and see how inhaler is working. If symptoms do not improve or worsen, please contact office for sooner follow up or seek emergency care.       Moderate persistent asthma See above. Action plan in place. Trigger prevention reviewed.   Chronic respiratory failure with hypoxia (HCC) Exertional hypoxia requiring 2 L minute supplemental O2.  Stable on room air today.  Encouraged to utilize oxygen consistently with activity and at night.  See above plan.  Goal greater than 88 to 90%.  OSA on CPAP History of sleep apnea on CPAP.  Prior records unavailable.  Not using CPAP due to difficulties with tolerance.  May need more advanced support with BiPAP given risk of obesity hypoventilation syndrome.  Urgent split-night sleep study ordered for further evaluation.  Reviewed risks of untreated sleep apnea and correlation between current symptoms.  Discussed potential treatment options.  Healthy  weight loss encouraged.  Safe driving practices reviewed.  HFrEF (heart failure with reduced ejection fraction) (HCC) EF 40 to 50%.  Nonischemic cardiomyopathy.  Contributing to dyspnea.  Follow-up with cardiology as scheduled.  Euvolemic on exam.  Restrictive lung disease Likely due to body habitus given reduced ERV and normal diffusion capacity.  Awaiting HRCT chest for further evaluation and to rule out possible interstitial lung disease.  Obesity, Class III, BMI 40-49.9 (morbid obesity) (HCC) BMI 46.  See above   Advised if symptoms do not improve or worsen, to please contact office for sooner follow up or seek emergency care.   I spent 45 minutes of dedicated to the care of this patient on the date of this encounter to include pre-visit review of records, face-to-face time with the patient discussing conditions above, post visit ordering of testing, clinical documentation with the electronic health record, making appropriate referrals as documented, and communicating necessary findings to members of the patients care team.  Roetta Clarke, NP 08/09/2023  Pt aware and understands NP's role.

## 2023-08-08 NOTE — Progress Notes (Deleted)
 Cardiology Office Note:  .   Date:  08/08/2023  ID:  Jesse Morris, DOB 03/14/1980, MRN [id number] PCP: Dianah Fort, PA  [facility] Providers Cardiologist:  Knox Perl, MD { Click to update primary MD,subspecialty MD or APP then REFRESH:1}  History of Present Illness: .   Jesse Morris is a 43 y.o. male patient with hypertension, hypercholesterolemia, OSA noncompliant with CPAP, nonischemic cardiomyopathy with mildly reduced EF at 45 to 50% by cardiac catheterization on 04/17/2021, chronic diastolic heart failure when he is admitted with hypertensive urgency and acute heart failure.  Similar admission 10/21/2021 with hypertensive urgency.  ED visit 1224 and recurrent admission 05/16/2023 for chest pain and dyspnea and again with acute decompensated heart failure.  Discussed the use of AI scribe software for clinical note transcription with the patient, who gave verbal consent to proceed.  History of Present Illness    Labs   Lab Results  Component Value Date   CHOL 225 (H) 05/17/2023   HDL 67 05/17/2023   LDLCALC 136 (H) 05/17/2023   TRIG 110 05/17/2023   CHOLHDL 3.4 05/17/2023  Lp(a) 86.5 Lab Results  Component Value Date   NA 138 05/18/2023   K 3.8 05/18/2023   CO2 27 05/18/2023   GLUCOSE 100 (H) 05/18/2023   BUN 19 05/18/2023   CREATININE 1.05 05/18/2023   CALCIUM  8.7 (L) 05/18/2023   EGFR 80.0 05/25/2023   GFRNONAA >60 05/18/2023      Latest Ref Rng & Units 05/18/2023    4:23 AM 05/17/2023    3:48 AM 05/16/2023    5:11 AM  BMP  Glucose 70 - 99 mg/dL 784  696  295   BUN 6 - 20 mg/dL 19  19  19    Creatinine 0.61 - 1.24 mg/dL 2.84  1.32  4.40   Sodium 135 - 145 mmol/L 138  138  142   Potassium 3.5 - 5.1 mmol/L 3.8  3.6  3.5   Chloride 98 - 111 mmol/L 98  96  99   CO2 22 - 32 mmol/L 27  33  31   Calcium  8.9 - 10.3 mg/dL 8.7  8.7  9.0       Latest Ref Rng & Units 05/18/2023    8:10 AM 05/16/2023    5:11 AM 05/15/2023   10:11 PM  CBC  WBC 4.0 - 10.5 K/uL 6.7   5.9  5.2   Hemoglobin 13.0 - 17.0 g/dL 10.2  72.5  36.6   Hematocrit 39.0 - 52.0 % 49.7  42.1  45.0   Platelets 150 - 400 K/uL 286  238  270    Lab Results  Component Value Date   HGBA1C 5.4 04/17/2021    TSH normal at 1.33 on 06/02/2019  ROS  ***ROS  Physical Exam:   VS:  There were no vitals taken for this visit.   Wt Readings from Last 3 Encounters:  06/12/23 (!) 342 lb (155.1 kg)  05/31/23 (!) 339 lb (153.8 kg)  05/18/23 (!) 330 lb 14.6 oz (150.1 kg)    ***Physical Exam Studies Reviewed: Aaron Aas    CARDIAC CATHETERIZATION 04/17/2021    ECHOCARDIOGRAM COMPLETE 03/02/2023  1. Technically difficult study with limited views. Left ventricular ejection fraction, by estimation, is 45 to 50%. The left ventricle has mildly decreased function. The left ventricle demonstrates global hypokinesis. There is mild left ventricular hypertrophy. Left ventricular diastolic parameters are indeterminate. 2. Right ventricular systolic function was not well visualized. The right ventricular size is not  well visualized. Tricuspid regurgitation signal is inadequate for assessing PA pressure. 3. The mitral valve is grossly normal. Mild mitral valve regurgitation. 4. The aortic valve was not well visualized. Aortic valve regurgitation is not visualized. No aortic stenosis is present. 5. Aortic dilatation noted. There is dilatation of the aortic root, measuring 42 mm. There is dilatation of the ascending aorta, measuring 39 mm. 6. No significant change from 04/14/2021.  EKG:         Medications and allergies    No Known Allergies   Current Outpatient Medications:    atorvastatin  (LIPITOR) 40 MG tablet, Take 1 tablet (40 mg total) by mouth daily., Disp: 90 tablet, Rfl: 0   carvedilol  (COREG ) 6.25 MG tablet, Take 1 tablet (6.25 mg total) by mouth 2 (two) times daily with a meal., Disp: 180 tablet, Rfl: 0   furosemide  (LASIX ) 20 MG tablet, Take 1 tablet (20 mg total) by mouth daily., Disp: 90 tablet,  Rfl: 0   losartan  (COZAAR ) 50 MG tablet, Take 1 tablet (50 mg total) by mouth daily., Disp: 90 tablet, Rfl: 0   No orders of the defined types were placed in this encounter.    There are no discontinued medications.   ASSESSMENT AND PLAN: .      ICD-10-CM   1. Non-ischemic cardiomyopathy (HCC)  I42.8     2. Chronic heart failure with mildly reduced ejection fraction (HFmrEF, 41-49%) (HCC)  I50.22     3. Primary hypertension  I10     4. Class 3 severe obesity due to excess calories with serious comorbidity and body mass index (BMI) of 40.0 to 44.9 in adult  E66.813    Z68.41       Assessment and Plan Assessment & Plan      Signed,  Knox Perl, MD, [facility] [place] 08/08/2023, 3:57 AM [facility] [address] [place], [place] [address] Phone: [phone]. Fax:  [phone]

## 2023-08-08 NOTE — Progress Notes (Unsigned)
 Epworth Sleepiness Scale  Use the following scale to choose the most appropriate number for each situation. 0 Would never nod off 1  Slight  chance of nodding off 2 Moderate chance of nodding off 3 High chance of nodding off  Sitting and reading: 3 Watching TV: 3 Sitting, inactive, in a public place (e.g., in a meeting, theater, or dinner event): 3 As a passenger in a car for an hour or more without stopping for a break: 3 Lying down to rest when circumstances permit:3 Sitting and talking to someone: 3 Sitting quietly after a meal without alcohol: 3 In a car, while stopped for a few  minutes in traffic or at a light: 3  TOTAL: 24

## 2023-08-08 NOTE — Patient Instructions (Addendum)
 Albuterol  inhaler 2 puffs every 6 hours as needed for shortness of breath or wheezing. Notify if symptoms persist despite rescue inhaler/neb use. Trial Trelegy 1 puff daily. Brush tongue and rinse mouth afterwards   Call your heart doctor about the refills on your lasix  and carvedilol   Attend high resolution CT chest as scheduled   Labs today  Given your symptoms and history, I am concerned that you still have sleep disordered breathing with sleep apnea and possible obesity hypoventilation syndrome. You will need a sleep study for further evaluation. Someone will contact you to schedule this.   We discussed how untreated sleep apnea puts an individual at risk for cardiac arrhthymias, pulm HTN, DM, stroke and increases their risk for daytime accidents. We also briefly reviewed treatment options including weight loss, side sleeping position, oral appliance, CPAP therapy or referral to ENT for possible surgical options  Use caution when driving and pull over if you become sleepy.  Follow up in 6 weeks with Katie Naina Sleeper,NP to go over sleep study results and see how inhaler is working. If symptoms do not improve or worsen, please contact office for sooner follow up or seek emergency care.

## 2023-08-08 NOTE — Patient Instructions (Signed)
 Full PFT performed today.

## 2023-08-08 NOTE — Progress Notes (Signed)
 Full PFT performed today.

## 2023-08-09 ENCOUNTER — Encounter: Payer: Self-pay | Admitting: Internal Medicine

## 2023-08-09 ENCOUNTER — Telehealth: Payer: Self-pay | Admitting: Nurse Practitioner

## 2023-08-09 ENCOUNTER — Encounter (HOSPITAL_BASED_OUTPATIENT_CLINIC_OR_DEPARTMENT_OTHER): Payer: Self-pay | Admitting: Nurse Practitioner

## 2023-08-09 ENCOUNTER — Encounter: Payer: Self-pay | Admitting: Cardiology

## 2023-08-09 DIAGNOSIS — J4520 Mild intermittent asthma, uncomplicated: Secondary | ICD-10-CM | POA: Insufficient documentation

## 2023-08-09 DIAGNOSIS — J4540 Moderate persistent asthma, uncomplicated: Secondary | ICD-10-CM | POA: Insufficient documentation

## 2023-08-09 DIAGNOSIS — J984 Other disorders of lung: Secondary | ICD-10-CM | POA: Insufficient documentation

## 2023-08-09 NOTE — Assessment & Plan Note (Addendum)
 History of sleep apnea on CPAP.  Prior records unavailable.  Not using CPAP due to difficulties with tolerance.  May need more advanced support with BiPAP given risk of obesity hypoventilation syndrome.  Urgent split-night sleep study ordered for further evaluation.  Reviewed risks of untreated sleep apnea and correlation between current symptoms.  Discussed potential treatment options.  Healthy weight loss encouraged.  Safe driving practices reviewed.

## 2023-08-09 NOTE — Assessment & Plan Note (Signed)
 Suspect his DOE is multifactorial related to HFrEF, possible obesity hypoventilation syndrome, possible asthma, body habitus and deconditioning.  Pulmonary function testing today with moderate restriction and reduced ERV, consistent with body habitus.  No diffusion defect.  Does have reversibility in his FEV1 postbronchodilator, consistent with possible reactive airway/asthma component.  Will trial him on triple therapy with Trelegy.  Side effect profile reviewed.  Teach back performed.  Reassess response at follow-up.  Encouraged him to be consistent with use.  Provided with albuterol  HFA for rescue and reviewed difference in maintenance versus rescue therapy. Check allergen marker, CBC with differential to assess peripheral eosinophils and BNP today.  Encouraged to work on graded exercise and healthy weight loss measures. Reviewed risks of untreated hypoxia and importance of compliance with oxygen therapy  Patient Instructions  Albuterol  inhaler 2 puffs every 6 hours as needed for shortness of breath or wheezing. Notify if symptoms persist despite rescue inhaler/neb use. Trial Trelegy 1 puff daily. Brush tongue and rinse mouth afterwards   Call your heart doctor about the refills on your lasix  and carvedilol   Attend high resolution CT chest as scheduled   Labs today  Given your symptoms and history, I am concerned that you still have sleep disordered breathing with sleep apnea and possible obesity hypoventilation syndrome. You will need a sleep study for further evaluation. Someone will contact you to schedule this.   We discussed how untreated sleep apnea puts an individual at risk for cardiac arrhthymias, pulm HTN, DM, stroke and increases their risk for daytime accidents. We also briefly reviewed treatment options including weight loss, side sleeping position, oral appliance, CPAP therapy or referral to ENT for possible surgical options  Use caution when driving and pull over if you become  sleepy.  Follow up in 6 weeks with Katie Dannette Kinkaid,NP to go over sleep study results and see how inhaler is working. If symptoms do not improve or worsen, please contact office for sooner follow up or seek emergency care.

## 2023-08-09 NOTE — Assessment & Plan Note (Signed)
 EF 40 to 50%.  Nonischemic cardiomyopathy.  Contributing to dyspnea.  Follow-up with cardiology as scheduled.  Euvolemic on exam.

## 2023-08-09 NOTE — Assessment & Plan Note (Signed)
 Likely due to body habitus given reduced ERV and normal diffusion capacity.  Awaiting HRCT chest for further evaluation and to rule out possible interstitial lung disease.

## 2023-08-09 NOTE — Telephone Encounter (Signed)
 Spoke to pt   Called and confirmed CT on 6/3 and sleep study on 6/3   He is okay to complete those   URGENT orders scheduled and called   Just FYI for provider

## 2023-08-09 NOTE — Assessment & Plan Note (Signed)
 See above. Action plan in place. Trigger prevention reviewed.

## 2023-08-09 NOTE — Assessment & Plan Note (Signed)
 BMI 46.  See above

## 2023-08-09 NOTE — Assessment & Plan Note (Signed)
 Exertional hypoxia requiring 2 L minute supplemental O2.  Stable on room air today.  Encouraged to utilize oxygen consistently with activity and at night.  See above plan.  Goal greater than 88 to 90%.

## 2023-08-12 ENCOUNTER — Telehealth: Payer: Self-pay | Admitting: Licensed Clinical Social Worker

## 2023-08-12 NOTE — Telephone Encounter (Signed)
 H&V Care Navigation CSW Progress Note  Clinical Social Worker contacted patient by phone to provide check in and ensure pt has received resources sent to him. Was able to reach him at [phone]. Confirmed he received assistance resources, has disability pending appeal, and still residing on his own. Has been to see pulmonology and has f/u with their team and additional testing, on o2 tank  Not working at all at this time, pt understands unfortunately disability may take time to process again. No long term financial assistance exists, but encouraged him to reach out to groups provided as needed for rent/utility assistance. Also encouraged him to reach out if any additional community resources needed.  Patient is participating in a Managed Medicaid Plan:  No, self pay only  SDOH Screenings   Food Insecurity: Food Insecurity Present (05/31/2023)  Housing: High Risk (05/31/2023)  Transportation Needs: Unmet Transportation Needs (05/31/2023)  Utilities: Not At Risk (05/31/2023)  Recent Concern: Utilities - At Risk (05/16/2023)  Financial Resource Strain: Medium Risk (05/31/2023)  Stress: Stress Concern Present (05/31/2023)  Tobacco Use: Medium Risk (08/09/2023)  Health Literacy: Adequate Health Literacy (05/31/2023)    Nathen Balder, MSW, LCSW Clinical Social Worker II [facility] Health Heart/Vascular Care Navigation  [phone]- work cell phone (preferred)

## 2023-08-13 ENCOUNTER — Ambulatory Visit
Admission: RE | Admit: 2023-08-13 | Discharge: 2023-08-13 | Disposition: A | Discharge status: Home / Self Care | Source: Ambulatory Visit | Attending: Internal Medicine | Admitting: Internal Medicine

## 2023-08-13 ENCOUNTER — Ambulatory Visit (HOSPITAL_BASED_OUTPATIENT_CLINIC_OR_DEPARTMENT_OTHER): Attending: Nurse Practitioner | Admitting: Internal Medicine

## 2023-08-13 DIAGNOSIS — G4736 Sleep related hypoventilation in conditions classified elsewhere: Secondary | ICD-10-CM | POA: Diagnosis not present

## 2023-08-13 DIAGNOSIS — R0602 Shortness of breath: Secondary | ICD-10-CM

## 2023-08-13 DIAGNOSIS — G4733 Obstructive sleep apnea (adult) (pediatric): Secondary | ICD-10-CM | POA: Diagnosis present

## 2023-08-13 DIAGNOSIS — J302 Other seasonal allergic rhinitis: Secondary | ICD-10-CM

## 2023-08-13 DIAGNOSIS — Z8679 Personal history of other diseases of the circulatory system: Secondary | ICD-10-CM

## 2023-08-13 DIAGNOSIS — Z8669 Personal history of other diseases of the nervous system and sense organs: Secondary | ICD-10-CM

## 2023-08-13 DIAGNOSIS — R059 Cough, unspecified: Secondary | ICD-10-CM

## 2023-08-14 ENCOUNTER — Other Ambulatory Visit (HOSPITAL_COMMUNITY): Payer: Self-pay

## 2023-08-15 ENCOUNTER — Telehealth (HOSPITAL_BASED_OUTPATIENT_CLINIC_OR_DEPARTMENT_OTHER): Payer: Self-pay | Admitting: Licensed Clinical Social Worker

## 2023-08-15 NOTE — Telephone Encounter (Signed)
 H&V Care Navigation CSW Progress Note  Clinical Social Worker received a call from pt asking for assistance with utility bill. Provided him with information for [facility] [employer] [employer] which opens funds 6/9, [employer] and [employer] to see if he is able to access any of their assistance. We discussed that many organizations will ask for proof of how bills will be covered after that time. Pt has disability pending, but we again discussed that this is taking 90+ days to process and wait times have lengthened over the past several years. Pt also has received prior denial. Encouraged him to call and let me know if none of these options work out.  Patient is participating in a Managed Medicaid Plan:  Yes- [facility]  SDOH Screenings   Food Insecurity: Food Insecurity Present (05/31/2023)  Housing: High Risk (05/31/2023)  Transportation Needs: Unmet Transportation Needs (05/31/2023)  Utilities: Not At Risk (05/31/2023)  Recent Concern: Utilities - At Risk (05/16/2023)  Financial Resource Strain: Medium Risk (05/31/2023)  Stress: Stress Concern Present (05/31/2023)  Tobacco Use: Medium Risk (08/09/2023)  Health Literacy: Adequate Health Literacy (05/31/2023)     Nathen Balder, MSW, LCSW Clinical Social Worker II [facility] Health Heart/Vascular Care Navigation  [phone]- work cell phone (preferred)

## 2023-08-17 DIAGNOSIS — G4733 Obstructive sleep apnea (adult) (pediatric): Secondary | ICD-10-CM

## 2023-08-17 NOTE — Procedures (Signed)
 [facility] [address] [place], [place] [address] Tel: [phone]   Fax: [phone]  Split Night Interpretation  Patient Name:  Jesse Morris, Jesse Morris Date:  08/13/2023 Referring Physician:  Mariah Shines. Cobb, NP  Indications for Polysomnography The patient is a 43 year old Male who is 6' and weighs 340.0 lbs.  His BMI equals 46.6.  A diagnostic polysomnogram was performed to evaluate for -.  After 131.5 minutes of sleep time the patient exhibited sufficient respiratory events qualifying him for a CPAP trial which was then initiated.    Patient reported taking his medication at 10:15 pm.  MELATONIN   Polysomnogram Data A full night polysomnogram was performed recording the standard physiologic parameters including EEG, EOG, EMG, EKG, nasal and oral airflow.  Respiratory parameters of chest and abdominal movements are recorded with Piezo-Crystal motion transducers.  Oxygen saturation was recorded by pulse oximetry.    Sleep Architecture The total recording time of the diagnostic portion of the study was 156.2 minutes.  The total sleep time was 131.5 minutes.  During the diagnostic portion of the study, the patient spent 44.1% of total sleep time in Stage N1, 44.1% in Stage N2, 0.0% in Stages N3, and 11.8% in REM.   Sleep latency was 5.2 minutes.  REM latency was 107.0 minutes.  Sleep Efficiency was 84.2%.  Wake after Sleep Onset time was 19.5 minutes.   At 01:07:51 AM the patient was placed on PAP treatment and was titrated at pressures ranging from 8* cm/H20 with supplemental oxygen at - up to 26/22/0** cm/H20 with supplemental oxygen at -.  The total recording time of the treatment portion of the study was 234.1 minutes.  The total sleep time was 211.5 minutes.  During the treatment portion of the study, the patient spent 6.4% of total sleep time in Stage N1, 11.1% in Stage N2, 0.0% in Stages N3, and 82.5% in REM.   Sleep latency was 5.5 minutes.   REM latency was 38.5 minutes.  Sleep Efficiency was 90.3%.  Wake after Sleep Onset time was 17.0 minutes.  Respiratory Events During the diagnostic portion of the study, the polysomnogram revealed a presence of 5 obstructive, - central, and - mixed apneas resulting in an Apnea index of 2.3 events per hour.  There were 286 hypopneas (>=3% desaturation and/or arousal) resulting in an Apnea\Hypopnea Index (AHI >=3% desaturation and/or arousal) of 132.8 events per hour.  There were 241 hypopneas (>=4% desaturation) resulting in an Apnea\Hypopnea Index (AHI >=4% desaturation) of 112.2 events per hour.  There were - Respiratory Effort Related Arousals resulting in a RERA index of - events per hour. The Respiratory Disturbance Index is 132.8 events per hour.  The snore index was - events per hour.  Mean oxygen saturation was 77.2%.  The lowest oxygen saturation during sleep was 58.0%.  Time spent <=88% oxygen saturation was 138.3 minutes (92.9%).  End Tidal CO2 during sleep ranged from - to - mmHg. End Tidal CO2 was greater than 50 mmHg for - minutes and greater than 55 mmHg for - minutes.  During the treatment portion of the study, the polysomnogram revealed a presence of 47 obstructive, 2 central, and 4 mixed apneas resulting in an Apnea index of 15.0 events per hour.  There were 124 hypopneas (>=3% desaturation and/or arousal) resulting in an Apnea\Hypopnea Index (AHI >=3% desaturation and/or arousal) of 50.2 events per hour.  There were 84 hypopneas (>=4% desaturation) resulting in an Apnea\Hypopnea Index (AHI >=  4% desaturation) of 38.9 events per hour.  There were 5 Respiratory Effort Related Arousals resulting in a RERA index of 1.4 events per hour. The Respiratory Disturbance Index is 51.6 events per hour.  The snore index was - events per hour.  Mean oxygen saturation was 87.0%.  The lowest oxygen saturation during sleep was 67.0%.  Time spent <=88% oxygen saturation was 123.7 minutes (53.1%).  Limb  Activity During the diagnostic portion of the study, there were - limb movements recorded.  Of this total, - were classified as PLMs.  Of the PLMs, - were associated with arousals.  The Limb Movement index was - per hour while the PLM index was - per hour.  During the treatment portion of the study, there were - limb movements recorded.  Of this total, - were classified as PLMs.  Of the PLMs, - were associated with arousals.  The Limb Movement index was - per hour while the PLM index was - per hour.  Cardiac Summary During the diagnostic portion of the study, the average pulse rate was 94.9 bpm.  The minimum pulse rate was 66.0 bpm while the maximum pulse rate was 116.0 bpm.  During the treatment portion of the study, the average pulse rate was 90.8 bpm.  The minimum pulse rate was 66.0 bpm while the maximum pulse rate was 111.0 bpm.   Comment:  Severe obstructive sleep apnea, AHI (4%) 112.2/hr. Snoring with oxygen desaturation to a nadir of 58% with mean 72%, indicating nocturnal hypoxemia. CPAP titration was unable to provide adequate control and was changed to bilevel titration. Final BIPAP 24/20, PS 0 with residual AHI (4%) 8.4/hr, minimum O2 saturation 86%, mean 90.6%. Diagnosis: Obstructive sleep apnea, Nocturnal Hypoxemia  Recommendations: BiPAP 25/20, PS 2 or autoBIPAP.    This study was personally reviewed and electronically signed by: Dr. [employer] Accredited Board Certified in Sleep Medicine Date/Time: 08/17/23  11:10       Split Night Report  Patient Name: Jesse Morris, Jesse Morris Date: 08/13/2023  Date of Birth: Jul 01, 1980 Study Type: Split Night  Age: 43 year MRN #: [id number]  Sex: Male Interpreting Physician: [employer] [id number]  Height: 6' Referring Physician: Mariah Shines. Cobb, NP  Weight: 340.0 lbs Recording Tech: Antoine Bathe Spruill RRT RPSGT RST  BMI: 46.6 Scoring Tech: Roosvelt Colla RRT RPSGT RST  ESS: 21 Neck Size: 20.5  Mask Type [employer] SIMPLUS Final  Pressure: 26/22 CMH2O  Mask Size: MEDIUM Supplemental O2: -   Study Overview  DIAGNOSTIC TREATMENT  Lights Off: 10:31:21 PM Lights Off: 01:07:31 AM  Lights On: 01:07:31 AM Lights On: 05:01:39 AM  Time in Bed: 156.2 min. Time in Bed: 234.1 min.  Total Sleep Time: 131.5 min. Total Sleep Time: 211.5 min.  Sleep Efficiency: 84.2% Sleep Efficiency: 90.3%  Sleep Latency: 5.2 min. Sleep Latency: 5.5 min.  REM Latency from Sleep Onset: 107.0 min. REM Latency from Sleep Onset: 38.5 min.  Wake After Sleep Onset: 19.5 min. Wake After Sleep Onset: 17.0 min.   DIAGNOSTIC TREATMENT   Count Index  Count Index  Awakenings: 15 6.8 Awakenings: 21 6.0  Arousals: 230 104.9 Arousals: 48 13.6  AHI (>=3% Desat and/or Ar.): 291 132.8 AHI (>=3% Desat and/or Ar.): 177 50.2  AHI (>=4% Desat): 246 112.2 AHI (>=4% Desat): 137 38.9   Limb Movements: - - Limb Movements: - -  Snore: - - Snore: - -  Desaturations: 288 132.8 Desaturations: 206 58.4  Minimum SpO2 TST: 58.0% Minimum SpO2 TST: 67.0%  Sleep Architecture   DIAGNOSTIC TREATMENT ENTIRE NIGHT  Stages Time (mins) % Sleep Time Time (mins) % Sleep Time Time (mins) % Sleep Time  Wake 25.0  23.0  48.0   Stage N1 58.0 44.1% 13.5 6.4% 71.5 20.8%  Stage N2 58.0 44.1% 23.5 11.1% 81.5 23.8%  Stage N3 0.0 0.0% 0.0 0.0% 0.0 0.0%  REM 15.5 11.8% 174.5 82.5% 190.0 55.4%   Arousal Summary   DIAGNOSTIC TREATMENT   NREM REM TST Index NREM REM TST Index  Respiratory Ar. 192 24 216 98.6 23 6 29  8.2  PLM Ar. - - - - - - - -  Isolated Limb Movement Ar. - - - - - - - -  Snore Ar. - - - - - - - -  Spontaneous Ar. 13 1 14  6.4 9 10 19  5.4  Total Ar. 205 25 230 104.9 32 16 48 13.6    Respiratory Summary  DIAGNOSTIC By Sleep Stage By Body Position Total   NREM REM Supine Non-Supine   Time (min) 116.0 15.5 - 131.5 131.5         Obstructive Apnea 2 3 - 5 5  Mixed Apnea - - - - -  Central Apnea - - - - -  Total Apneas 2 3 - 5 5  Total Apnea Index 1.0 11.6 - 2.3  2.3         Hypopneas (>=3% Desat and/or Ar.) 261 25 - 286 286  AHI (>=3% Desat and/or Ar.) 136.0 108.4 - 132.8 132.8         Hypopneas (>=4% Desat) 220 21 - 241 241  AHI (>=4% Desat) 114.8 92.9 - 112.2 112.2          RERAs - - - - -  RERA Index - - - - -         RDI 136.0 108.4 - 132.8 132.8    TREATMENT By Sleep Stage By Body Position Total   NREM REM Supine Non-Supine   Time (min) 37.0 174.5 4.5 207.0 211.5         Obstructive Apnea 21 26 3  44 47  Mixed Apnea 4 - 4 - 4  Central Apnea 1 1 - 2 2  Total Apneas 26 27 7  46 53  Total Apnea Index 42.2 9.3 93.3 13.3 15.0         Hypopneas (>=3% Desat and/or Ar.) 21 103 - 124 124  AHI (>=3% Desat and/or Ar.) 76.2 44.7 93.3 49.3 50.2         Hypopneas (>=4% Desat) 16 68 - 84 84  AHI (>=4% Desat) 68.1 32.7 93.3 37.7 38.9          RERAs 2 3 - 5 5  RERA Index 3.2 1.0 - 1.4 1.4         RDI 79.5 45.7 93.3 50.7 51.6    Respiratory Event Durations   DIAGNOSTIC TREATMENT  Apnea NREM REM NREM REM  Average (seconds) 21.4 32.9 22.8 19.2  Maximum (seconds) 24.2 45.1 36.6 32.8  Hypopnea      Average (seconds) 20.3 25.9 23.8 21.4  Maximum (seconds) 35.5 34.7 48.6 46.1    Limb Movement Summary   DIAGNOSTIC TREATMENT   Count Index Count Index  Isolated Limb Movements - - - -  Periodic Limb Movements (PLMs) - - - -  Total Limb Movements - - - -    Oxygen Saturation Summary   DIAGNOSTIC TREATMENT   Wake NREM REM TST Wake NREM REM TST  Average SpO2 84.0% 77.1% 68.5% 76.1% 87.3% 87.4% 86.9% 87.0%  Minimum SpO2 67.0% 60.0% 58.0% 58.0%  67.0% 70.0% 67.0% 67.0%   Maximum SpO2 98.0% 95.0% 89.0% 95.0%  98.0% 98.0% 99.0% 99.0%    DIAGNOSTIC Oxygen Saturation Distribution  Range (%) Time in range (min) Time in range (%)   90.0 - 100.0 5.8 3.9%  80.0 - 90.0 43.2 29.0%  70.0 - 80.0 73.7 49.5%  60.0 - 70.0 25.2 16.9%  50.0 - 60.0 0.9 0.6%  0.0 - 50.0 - -  Time Spent <=88% SpO2  Range (%) Time in range (min) Time in range (%)   0.0 - 88.0 138.3 92.9%      Count Index  Desaturations: 288 132.8   TREATMENT Oxygen Saturation Distribution  Range (%) Time in range (min) Time in range (%)   90.0 - 100.0 61.5 26.4%  80.0 - 90.0 145.8 62.7%  70.0 - 80.0 24.4 10.5%  60.0 - 70.0 1.0 0.4%  50.0 - 60.0 - -  0.0 - 50.0 - -  Time Spent <=88% SpO2  Range (%) Time in range (min) Time in range (%)  0.0 - 88.0 123.7 53.1%      Count Index  Desaturations: 206 58.4     Cardiac Summary   DIAGNOSTIC TREATMENT   Wake NREM REM Total Wake NREM REM Total  Average Pulse Rate (BPM) 96.4 94.4 96.7 94.9 89.7 81.5 92.9 90.8  Minimum Pulse Rate (BPM) 78.0 66.0 80.0 66.0 68.0 66.0 75.0 66.0  Maximum Pulse Rate (BPM) 116.0 111.0 109.0 116.0 111.0 100.0 105.0 111.0   Pulse Rate Distribution   DIAGNOSTIC  Range (bpm) Time in range (min) Time in range (%)  0.0 - 40.0 - -  40.0 - 60.0 - -  60.0 - 80.0 8.6 5.7%  80.0 - 100.0 107.1 70.6%  100.0 - 120.0 34.8 22.9%  120.0 - 140.0 - -  140.0 - 200.0 - -   TREATMENT  Range (bpm) Time in range (min) Time in range (%)  0.0 - 40.0 - -  40.0 - 60.0 - -  60.0 - 80.0 21.1 9.0%  80.0 - 100.0 199.9 85.5%  100.0 - 120.0 12.3 5.3%  120.0 - 140.0 - -  140.0 - 200.0 - -   EtCO2 Summary - Diagnostic  Stage Min (mmHg) Average (mmHg) Max (mmHg)  Wake - - -  NREM(1+2+3) - - -  REM - - -   EtCO2 Distribution:  Range (mmHg) Time in range (min) Time in range (%)  20.0 - 40.0 - -  40.0 - 50.0 - -  50.0 - 100.0 - -  55.0 - 100.0 - -  Excluded data <20.0 & >65.0 156.5 100.0%   Titration Summary  PAP Device PAP Level O2 Level Time (min) Wake (min) NREM (min) REM (min) Sleep Eff% OA# CA# MA# Hyp# (>=3%) AHI (>=3%) Hyp# (>=4%) AHI (>=%4) RERA RDI OSat <=88% (min) Min [employer] Ar. Index  - Off - 156.5 25.0 116.0 15.5 84.0% 5 - - 286 132.8 241  112.2 -  132.8  124.5 58.0 76.1 104.9  CPAP 8 - 31.5 18.0 13.5 0.0 42.9% 18 - 4 1 102.2 1  102.2 -  102.2  6.6 77.0 87.9 97.8   CPAP 10 - 10.5 0.0 10.5 0.0 100.0% 2 - - 14 91.4 11  74.3 2  102.9  9.6 76.0 83.1 40.0  CPAP 12 - 10.0 1.0 2.0 7.0 90.0% 4 2 - 10 106.7 9  100.0 -  106.7  8.3 67.0 79.6 53.3  CPAP 14 - 21.0 0.0 0.0 21.0 100.0% 7 - - 23 85.7 17  68.6 -  85.7  20.5 70.0 79.4 8.6  CPAP 16 - 37.5 1.5 1.0 35.0 96.0% 16 - - 13 48.3 5  35.0 -  48.3  32.8 79.0 85.1 -  CPAP 18 - 15.5 0.0 0.5 15.0 100.0% - - - 13 50.3 9  34.8 -  50.3  12.4 82.0 87.2 -  CPAP 20 - 57.5 1.0 4.0 52.5 98.3% - - - 42 44.6 25  26.5 1  45.7  20.6 81.0 89.3 3.2  Bilevel 24/20/0 - 28.5 0.5 3.5 24.5 98.2% - - - 5 10.7 4  8.6 -  10.7  0.4 87.0 91.1 4.3  Bilevel 26/22/0 - 22.5 1.0 2.0 19.5 95.6% - - - 3 8.4 3  8.4 2  14.0  2.0 86.0 90.6 8.4    Hypnograms                           Technologist Comments  Patient was ordered as a Split Night titration. Patient is a 43 year old male who was sent to the sleep center for OSA. Patient met split night criteria per standing protocol. Patient was placed on CPAP of 8 CMH2O with 2 CMH2O of EPR at 1:07 am and was increased to a Bilevel pressure of 26/22 CMH2O with no audible snoring noted. Patient was increased for respiratory events with and without a 4% Desat or arousal, and audible snoring. Patient tolerated CPAP /Bilevel trial fairly well. No oxygen was applied. Patient reported taking his medication at 10:15 pm. No PLM's/PLMA's were noted. Questionable cardiac arrhythmias were noted pvc's see attached sheets and epochs for examples: 136, 150, 330, 400, 648, 760, etc. One restroom visit was noted. Patient was fitted with a [employer] Simplus nasal/oral full-face mask size medium with heated humidification.                             [employer] Diplomate, Biomedical engineer of Sleep Medicine  ELECTRONICALLY SIGNED ON:  08/17/2023, 11:00 AM [facility] PH: [phone]   FX: [phone] ACCREDITED BY THE AMERICAN ACADEMY  OF SLEEP MEDICINE

## 2023-08-19 ENCOUNTER — Other Ambulatory Visit (HOSPITAL_COMMUNITY): Payer: Self-pay

## 2023-08-23 ENCOUNTER — Telehealth: Payer: Self-pay | Admitting: Nurse Practitioner

## 2023-08-23 DIAGNOSIS — G4733 Obstructive sleep apnea (adult) (pediatric): Secondary | ICD-10-CM

## 2023-08-26 NOTE — Telephone Encounter (Signed)
 Pt notified and order placed

## 2023-08-28 ENCOUNTER — Other Ambulatory Visit (HOSPITAL_COMMUNITY): Payer: Self-pay

## 2023-08-30 ENCOUNTER — Telehealth: Payer: Self-pay | Admitting: Student

## 2023-08-30 NOTE — Telephone Encounter (Signed)
 Rc'd CMN from [facility]. Will fwd to Ms. Parrett to sign. BIPAP supplies.

## 2023-09-12 ENCOUNTER — Telehealth: Payer: Self-pay

## 2023-09-12 NOTE — Telephone Encounter (Signed)
 Received CMN Bipap and  supplies. Awaiting signature

## 2023-09-18 NOTE — Telephone Encounter (Signed)
 CMN signed and faxed. Form placed in scan folder.

## 2023-09-19 ENCOUNTER — Other Ambulatory Visit (HOSPITAL_COMMUNITY): Payer: Self-pay

## 2023-09-19 ENCOUNTER — Ambulatory Visit: Attending: Cardiology | Admitting: Cardiology

## 2023-09-19 ENCOUNTER — Encounter: Payer: Self-pay | Admitting: Cardiology

## 2023-09-19 VITALS — BP 141/99 | HR 94 | Resp 16 | Ht 72.0 in | Wt 339.4 lb

## 2023-09-19 DIAGNOSIS — I10 Essential (primary) hypertension: Secondary | ICD-10-CM | POA: Diagnosis present

## 2023-09-19 DIAGNOSIS — E66813 Obesity, class 3: Secondary | ICD-10-CM | POA: Diagnosis present

## 2023-09-19 DIAGNOSIS — I428 Other cardiomyopathies: Secondary | ICD-10-CM | POA: Diagnosis present

## 2023-09-19 DIAGNOSIS — Z6841 Body Mass Index (BMI) 40.0 and over, adult: Secondary | ICD-10-CM | POA: Insufficient documentation

## 2023-09-19 DIAGNOSIS — I110 Hypertensive heart disease with heart failure: Secondary | ICD-10-CM | POA: Diagnosis present

## 2023-09-19 DIAGNOSIS — G4733 Obstructive sleep apnea (adult) (pediatric): Secondary | ICD-10-CM | POA: Insufficient documentation

## 2023-09-19 MED ORDER — LOSARTAN POTASSIUM 100 MG PO TABS
100.0000 mg | ORAL_TABLET | Freq: Every day | ORAL | 1 refills | Status: DC
  Filled 2023-09-19: qty 90, 90d supply, fill #0
  Filled 2024-01-17: qty 90, 90d supply, fill #1

## 2023-09-19 MED ORDER — CARVEDILOL 25 MG PO TABS
25.0000 mg | ORAL_TABLET | Freq: Two times a day (BID) | ORAL | 1 refills | Status: DC
  Filled 2023-09-19: qty 180, 90d supply, fill #0

## 2023-09-19 NOTE — Patient Instructions (Addendum)
 Medication Instructions:  Your physician has recommended you make the following change in your medication: Increase Carvedilol  to 25 mg by mouth twice daily Increase Losartan  to 100 mg by mouth daily   *If you need a refill on your cardiac medications before your next appointment, please call your pharmacy*  Lab Work: none If you have labs (blood work) drawn today and your tests are completely normal, you will receive your results only by: MyChart Message (if you have MyChart) OR A paper copy in the mail If you have any lab test that is abnormal or we need to change your treatment, we will call you to review the results.  Testing/Procedures: none  Follow-Up: At [facility], you and your health needs are our priority.  As part of our continuing mission to provide you with exceptional heart care, our providers are all part of one team.  This team includes your primary Cardiologist (physician) and Advanced Practice Providers or APPs (Physician Assistants and Nurse Practitioners) who all work together to provide you with the care you need, when you need it.  Your next appointment:   Sept 12 at 3:20  Provider:   Gordy Bergamo, MD    We recommend signing up for the patient portal called MyChart.  Sign up information is provided on this After Visit Summary.  MyChart is used to connect with patients for Virtual Visits (Telemedicine).  Patients are able to view lab/test results, encounter notes, upcoming appointments, etc.  Non-urgent messages can be sent to your provider as well.   To learn more about what you can do with MyChart, go to [email or web address].   Other Instructions

## 2023-09-19 NOTE — Progress Notes (Signed)
 Cardiology Office Note:  .   Date:  09/19/2023  ID:  Jesse Morris, DOB Jul 26, 1980, MRN [id number] PCP: Rosalea Rosina SAILOR, PA  [facility] Providers Cardiologist:  Gordy Bergamo, MD   History of Present Illness: .   Jesse Morris is a 43 y.o. Initially established with cardiology during admission 04/2021 during admission for dyspnea and hypoxia. O2 sats upon arrival 88%. History of smoking cigarettes and THC. 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 Had recent Influenza A infection. TTE during admission with EF 45-50%, mild LVE, moderate LVH, normal diastolic parameters and RV and no signs of pulmonary HTN. No obvious shunt but bubble study not done. CTA with no PE.  He is now applying for permanent disability and Medicaid.  Cardiac catheterization on 05/07/2021 revealing no evidence of pulm hypertension and normal coronary arteries.  Prior diagnosis of sleep apnea but not on CPAP.  Cardiomyopathy felt to be due to uncontrolled hypertension.  Patient has had issues with follow-ups and affording medications.  Discussed the use of AI scribe software for clinical note transcription with the patient, who gave verbal consent to proceed.  History of Present Illness Jesse Morris is a 43 year old male with hypertension and breathing difficulties who presents with chest pain.  He experiences sharp chest pain radiating to his left arm, which led to a recent hospital admission. A cardiac catheterization two years ago showed normal coronary arteries. He has significant breathing difficulties, with oxygen saturation dropping from 93% to the low 80s during episodes. He uses a BiPAP machine following a sleep study, which he finds more effective than CPAP. He is currently taking losartan  50 mg and atorvastatin  but is out of carvedilol  due to financial constraints.   Labs   Lab Results  Component Value Date   CHOL 225 (H) 05/17/2023   HDL 67 05/17/2023   LDLCALC 136 (H)  05/17/2023   TRIG 110 05/17/2023   CHOLHDL 3.4 05/17/2023   Lipoprotein (a)  Date/Time Value Ref Range Status  05/17/2023 03:48 AM 27.1 <75.0 nmol/L Final    Comment:    (NOTE) Note:  Values greater than or equal to 75.0 nmol/L may       indicate an independent risk factor for CHD,       but must be evaluated with caution when applied       to non-Caucasian populations due to the       influence of genetic factors on Lp(a) across       ethnicities. Performed At: [facility] [address] [place], [place] [id number] Jennette Shorter MD Ey:[id number]     Lab Results  Component Value Date   NA 138 05/18/2023   K 3.8 05/18/2023   CO2 27 05/18/2023   GLUCOSE 100 (H) 05/18/2023   BUN 19 05/18/2023   CREATININE 1.05 05/18/2023   CALCIUM  8.7 (L) 05/18/2023   EGFR 80.0 05/25/2023   GFRNONAA >60 05/18/2023      Latest Ref Rng & Units 05/18/2023    4:23 AM 05/17/2023    3:48 AM 05/16/2023    5:11 AM  BMP  Glucose 70 - 99 mg/dL 899  899  896   BUN 6 - 20 mg/dL 19  19  19    Creatinine 0.61 - 1.24 mg/dL 8.94  8.95  9.00   Sodium 135 - 145 mmol/L 138  138  142   Potassium 3.5 - 5.1 mmol/L 3.8  3.6  3.5   Chloride  98 - 111 mmol/L 98  96  99   CO2 22 - 32 mmol/L 27  33  31   Calcium  8.9 - 10.3 mg/dL 8.7  8.7  9.0       Latest Ref Rng & Units 05/18/2023    8:10 AM 05/16/2023    5:11 AM 05/15/2023   10:11 PM  CBC  WBC 4.0 - 10.5 K/uL 6.7  5.9  5.2   Hemoglobin 13.0 - 17.0 g/dL 85.0  87.1  86.4   Hematocrit 39.0 - 52.0 % 49.7  42.1  45.0   Platelets 150 - 400 K/uL 286  238  270    Lab Results  Component Value Date   HGBA1C 5.4 04/17/2021    BNP (last 3 results) Recent Labs    03/01/23 1444 05/15/23 2211  BNP 173.0* 49.1    External Labs:  Labs 08/02/2022:  Total cholesterol: 41, triglycerides 165, HDL 67, LDL 144. Non-HDL cholesterol 174.  Serum glucose 84 mg, BUN 14, creatinine 1.06, EGFR 90 mL, potassium 4.0, LFTs normal.  Hb 13.1/HCT 39.6, platelets 327. RDW  minimally elevated at 15.5.  A1c 5.7%.  NT proBNP 81.  TSH normal at 0.79.   ROS  Review of Systems  Cardiovascular:  Positive for chest pain and dyspnea on exertion. Negative for leg swelling.   Physical Exam:   VS:  BP (!) 141/99 (BP Location: Left Arm, Patient Position: Sitting, Cuff Size: Large)   Pulse 94   Resp 16   Ht 6' (1.829 m)   Wt (!) 339 lb 6.4 oz (154 kg)   BMI 46.03 kg/m    Wt Readings from Last 3 Encounters:  09/19/23 (!) 339 lb 6.4 oz (154 kg)  08/13/23 (!) 340 lb (154.2 kg)  08/08/23 (!) 344 lb (156 kg)    Physical Exam Constitutional:      Appearance: He is morbidly obese.  Neck:     Vascular: No carotid bruit or JVD.  Cardiovascular:     Rate and Rhythm: Normal rate and regular rhythm.     Pulses: Intact distal pulses.     Heart sounds: Normal heart sounds. No murmur heard.    No gallop.  Pulmonary:     Effort: Pulmonary effort is normal.     Breath sounds: Normal breath sounds.  Abdominal:     General: Bowel sounds are normal.     Palpations: Abdomen is soft.  Musculoskeletal:     Right lower leg: No edema.     Left lower leg: No edema.    Studies Reviewed: SABRA    ECHOCARDIOGRAM COMPLETE 03/02/2023  1. Technically difficult study with limited views. Left ventricular ejection fraction, by estimation, is 45 to 50%. The left ventricle has mildly decreased function. The left ventricle demonstrates global hypokinesis. There is mild left ventricular hypertrophy. Left ventricular diastolic parameters are indeterminate. 2. Right ventricular systolic function was not well visualized. The right ventricular size is not well visualized. Tricuspid regurgitation signal is inadequate for assessing PA pressure. 3. The mitral valve is grossly normal. Mild mitral valve regurgitation. 4. The aortic valve was not well visualized. Aortic valve regurgitation is not visualized. No aortic stenosis is present. 5. Aortic dilatation noted. There is dilatation of the  aortic root, measuring 42 mm. There is dilatation of the ascending aorta, measuring 39 mm.  EKG:         Medications ordered    Meds ordered this encounter  Medications   losartan  (COZAAR ) 100 MG tablet  Sig: Take 1 tablet (100 mg total) by mouth daily.    Dispense:  90 tablet    Refill:  1   carvedilol  (COREG ) 25 MG tablet    Sig: Take 1 tablet (25 mg total) by mouth 2 (two) times daily with a meal.    Dispense:  180 tablet    Refill:  1     ASSESSMENT AND PLAN: .      ICD-10-CM   1. NICM (nonischemic cardiomyopathy) (HCC)  I42.8 losartan  (COZAAR ) 100 MG tablet    carvedilol  (COREG ) 25 MG tablet    2. Hypertensive heart disease with heart failure (HCC)  I11.0 losartan  (COZAAR ) 100 MG tablet    carvedilol  (COREG ) 25 MG tablet    3. Primary hypertension  I10 losartan  (COZAAR ) 100 MG tablet    carvedilol  (COREG ) 25 MG tablet    4. Class 3 severe obesity due to excess calories with serious comorbidity and body mass index (BMI) of 40.0 to 44.9 in adult  E66.813    Z68.41     5. OSA on BiPAP  G47.33      Assessment & Plan Hypertension with hypertensive heart disease and chronic diastolic heart failure Hypertension remains poorly controlled, likely due to non-adherence to medication regimen secondary to financial constraints. Blood pressure remains high, posing a risk for cardiac complications. Current medications include losartan  and atorvastatin , but carvedilol  is not being taken due to lack of supply. Increased risk of heart weakening if blood pressure remains uncontrolled. - Increase losartan  to 100 mg once daily. - Prescribe carvedilol  25 mg twice daily for a 46-month supply. - Schedule follow-up in 2 months to monitor blood pressure control.  Chest pain Intermittent chest pain with radiation to the left arm. Recent cardiac catheterization showed normal coronary arteries, reducing concern for acute coronary syndrome. Symptoms are not suggestive of a heart  issue.  Chronic respiratory failure and hypoxemia Chronic respiratory failure is being managed with a BiPAP machine, which is reportedly more effective than the previous CPAP. Breathing difficulties persist, impacting daily activities and employment. Patient has mildly reduced LVEF and chronic diastolic heart failure.  His heart failure does not explain his respiratory symptoms and I suspect obesity hypoventilation and severe deconditioning as the etiology for his respiratory issues and hypoxemia. -Consider 6-minute walk test without oxygen now that he is stable.  Will send a note to Comer Rouleau, NP (pulmonary medicine).  Obesity Obesity is being managed with Wegovy, which has resulted in a weight loss of only 6-7 pounds over two months. Difficulty in physical activity due to respiratory issues and shoulder pain may hinder further weight loss. - Continue [id number] for weight management. - Encourage fluid intake and not to force eating if not hungry.   Signed,  Gordy Bergamo, MD, [facility] 09/19/2023, 6:34 PM [facility] [address] [place], [place] [address] Phone: [phone]. Fax:  [phone]

## 2023-09-26 ENCOUNTER — Encounter: Payer: Self-pay | Admitting: Nurse Practitioner

## 2023-09-26 ENCOUNTER — Ambulatory Visit: Admitting: Nurse Practitioner

## 2023-10-08 ENCOUNTER — Ambulatory Visit (HOSPITAL_BASED_OUTPATIENT_CLINIC_OR_DEPARTMENT_OTHER): Admitting: Primary Care

## 2023-10-29 ENCOUNTER — Encounter: Payer: Self-pay | Admitting: Nurse Practitioner

## 2023-11-21 ENCOUNTER — Ambulatory Visit: Payer: Self-pay | Admitting: Internal Medicine

## 2023-11-21 NOTE — Progress Notes (Signed)
 Saw him in April.  At that time D-dimer was normal.  I ordered a CT chest.  This was done in June 2025.  Please let him know it was normal.

## 2023-11-22 ENCOUNTER — Ambulatory Visit: Attending: Cardiology | Admitting: Cardiology

## 2023-11-22 ENCOUNTER — Encounter: Payer: Self-pay | Admitting: Cardiology

## 2023-11-22 VITALS — BP 151/95 | HR 85 | Resp 16 | Ht 72.0 in | Wt 352.0 lb

## 2023-11-22 DIAGNOSIS — I10 Essential (primary) hypertension: Secondary | ICD-10-CM | POA: Insufficient documentation

## 2023-11-22 DIAGNOSIS — E7800 Pure hypercholesterolemia, unspecified: Secondary | ICD-10-CM | POA: Diagnosis present

## 2023-11-22 DIAGNOSIS — I428 Other cardiomyopathies: Secondary | ICD-10-CM | POA: Insufficient documentation

## 2023-11-22 DIAGNOSIS — I110 Hypertensive heart disease with heart failure: Secondary | ICD-10-CM | POA: Insufficient documentation

## 2023-11-22 DIAGNOSIS — E662 Morbid (severe) obesity with alveolar hypoventilation: Secondary | ICD-10-CM | POA: Diagnosis present

## 2023-11-22 MED ORDER — FUROSEMIDE 20 MG PO TABS: 20.0000 mg | ORAL_TABLET | Freq: Every day | ORAL | 2 refills | Status: DC | PRN

## 2023-11-22 MED ORDER — ATORVASTATIN CALCIUM 40 MG PO TABS: 40.0000 mg | ORAL_TABLET | Freq: Every day | ORAL | 1 refills | Status: DC

## 2023-11-22 MED ORDER — CARVEDILOL 25 MG PO TABS: 25.0000 mg | ORAL_TABLET | Freq: Two times a day (BID) | ORAL | 1 refills | Status: DC

## 2023-11-22 NOTE — Patient Instructions (Signed)
 Medication Instructions:  Refilled Lipitor Refilled Coreg  Refilled Lasix  *If you need a refill on your cardiac medications before your next appointment, please call your pharmacy*  Lab Work: NONE If you have labs (blood work) drawn today and your tests are completely normal, you will receive your results only by: MyChart Message (if you have MyChart) OR A paper copy in the mail If you have any lab test that is abnormal or we need to change your treatment, we will call you to review the results.  Testing/Procedures: NONE  Follow-Up: At [facility], you and your health needs are our priority.  As part of our continuing mission to provide you with exceptional heart care, our providers are all part of one team.  This team includes your primary Cardiologist (physician) and Advanced Practice Providers or APPs (Physician Assistants and Nurse Practitioners) who all work together to provide you with the care you need, when you need it.  Your next appointment:   2 Months   Provider:   One of our Advanced Practice Providers (APPs): Morse Clause, PA-C  Lamarr Satterfield, NP Miriam Shams, NP  Olivia Pavy, PA-C Josefa Beauvais, NP  Leontine Salen, PA-C Orren Fabry, PA-C  [place], PA-C Ernest Dick, NP  Damien Braver, NP Jon Hails, PA-C  Waddell Donath, PA-C    Dayna Dunn, PA-C  Scott Weaver, PA-C Lum Louis, NP Katlyn West, NP Callie Goodrich, PA-C  Xika Zhao, NP Sheng Haley, PA-C    Kathleen Johnson, PA-C    We recommend signing up for the patient portal called MyChart.  Sign up information is provided on this After Visit Summary.  MyChart is used to connect with patients for Virtual Visits (Telemedicine).  Patients are able to view lab/test results, encounter notes, upcoming appointments, etc.  Non-urgent messages can be sent to your provider as well.   To learn more about what you can do with MyChart, go to [email or web address].

## 2023-11-22 NOTE — Progress Notes (Unsigned)
 Cardiology Office Note:  .   Date:  11/23/2023  ID:  Jesse Morris, DOB 11/01/1980, MRN [id number] PCP: Jesse Rosina SAILOR, PA  [facility] Providers Cardiologist:  Jesse Bergamo, MD   History of Present Illness: .   Jesse Morris is a 43 y.o. 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  He has also undergone extensive pulmonary workup and there is no evidence of pulm fibrosis by recent CT scan on 08/16/2023.  Patient has been trying to get disability and has stopped working.  I had seen him 2 months ago and initiated antihypertensive medications which he has not started for the last greater than a month stating that he has not had any money.  Cardiac Studies relevent.    High-resolution CT 08/16/2023: 1. No evidence of interstitial lung disease. 2. Air trapping is indicative of small airways disease. 3. Enlarged pulmonic trunk, indicative of pulmonary arterial hypertension.  ECHOCARDIOGRAM COMPLETE 03/02/2023  1. Technically difficult study with limited views. Left ventricular ejection fraction, by estimation, is 45 to 50%. The left ventricle has mildly decreased function. The left ventricle demonstrates global hypokinesis. There is mild left ventricular hypertrophy. Left ventricular diastolic parameters are indeterminate.  Cardiac catheterization on 05/07/2021  No evidence of pulm hypertension and normal coronary arteries.     Discussed the use of AI scribe software for clinical note transcription with the patient, who gave verbal consent to proceed.  History of Present Illness Jesse Morris is a 43 year old male with hypertension and heart  disease who presents for medication management and follow-up.  He experiences difficulty managing medications due to cost, leading to irregular intake of atorvastatin , losartan , and carvedilol . He has obtained a [facility] prescription card to assist with this issue.  He has gained weight, which he attributes to poor diet and lack of physical activity. Humidity causes dyspnea, limiting his ability to walk and exercise. He attempts to walk around the block but finds it challenging.  He uses a BiPAP machine but has difficulty sleeping, achieving only three to four hours per night with frequent awakenings. He is trying to comply with BiPAP usage.  He does not smoke.   Labs   Lab Results  Component Value Date   CHOL 225 (H) 05/17/2023   HDL 67 05/17/2023   LDLCALC 136 (H) 05/17/2023   TRIG 110 05/17/2023   CHOLHDL 3.4 05/17/2023   Lipoprotein (a)  Date/Time Value Ref Range Status  05/17/2023 03:48 AM 27.1 <75.0 nmol/L Final    Comment:    (NOTE) Note:  Values greater than or equal to 75.0 nmol/L may       indicate an independent risk factor for CHD,       but must be evaluated with caution when applied       to non-Caucasian populations due to the       influence of genetic factors on Lp(a) across       ethnicities. Performed At: [facility] [address] [place], [place] [id number] Jennette Shorter MD Ey:[id number]     Recent Labs    05/16/23 0511 05/17/23 0348 05/18/23 0423  NA 142 138 138  K 3.5 3.6 3.8  CL 99 96* 98  CO2 31 33* 27  GLUCOSE 103* 100* 100*  BUN 19 19 19   CREATININE 0.99 1.04 1.05  CALCIUM  9.0 8.7* 8.7*  GFRNONAA >60 >60 >60    Lab Results  Component Value Date   ALT 24 05/16/2023   AST 22 05/16/2023   ALKPHOS 34 (L) 05/16/2023   BILITOT 0.7 05/16/2023      Latest Ref Rng & Units 05/18/2023    8:10 AM 05/16/2023    5:11 AM 05/15/2023   10:11 PM  CBC  WBC 4.0 - 10.5 K/uL 6.7  5.9  5.2   Hemoglobin 13.0 - 17.0 g/dL 85.0  87.1  86.4    Hematocrit 39.0 - 52.0 % 49.7  42.1  45.0   Platelets 150 - 400 K/uL 286  238  270    Lab Results  Component Value Date   HGBA1C 5.4 04/17/2021    No results found for: TSH    ROS  Review of Systems  Cardiovascular:  Positive for dyspnea on exertion. Negative for chest pain and leg swelling.   Physical Exam:   VS:  BP (!) 151/95 (BP Location: Left Arm, Patient Position: Sitting, Cuff Size: Large)   Pulse 85   Resp 16   Ht 6' (1.829 m)   Wt (!) 352 lb (159.7 kg)   SpO2 95%   BMI 47.74 kg/m    Wt Readings from Last 3 Encounters:  11/22/23 (!) 352 lb (159.7 kg)  09/19/23 (!) 339 lb 6.4 oz (154 kg)  08/13/23 (!) 340 lb (154.2 kg)    BP Readings from Last 3 Encounters:  11/22/23 (!) 151/95  09/19/23 (!) 141/99  08/08/23 138/84   Physical Exam Constitutional:      Appearance: He is morbidly obese.  Neck:     Vascular: No carotid bruit or JVD.  Cardiovascular:     Rate and Rhythm: Normal rate and regular rhythm.     Heart sounds: Normal heart sounds. No murmur heard.    No gallop.  Pulmonary:     Effort: Pulmonary effort is normal.     Breath sounds: Normal breath sounds.  Abdominal:     General: Bowel sounds are normal.     Palpations: Abdomen is soft.  Musculoskeletal:     Right lower leg: No edema.     Left lower leg: No edema.    EKG:    EKG Interpretation Date/Time:  Friday November 22 2023 15:11:35 EDT Ventricular Rate:  79 PR Interval:  184 QRS Duration:  98 QT Interval:  400 QTC Calculation: 458 R Axis:   20  Text Interpretation: Normal sinus rhythm T wave abnormality, consider lateral ischemia No significant change since 05/15/23 and 03/01/23 Confirmed by Jesse Morris, Jesse Morris [phone]) on 11/22/2023 3:19:18 PM    ASSESSMENT AND PLAN: .      ICD-10-CM   1. Primary hypertension  I10 carvedilol  (COREG ) 25 MG tablet    2. Hypertensive heart disease with heart failure (HCC)  I11.0 EKG 12-Lead    carvedilol  (COREG ) 25 MG tablet    furosemide  (LASIX ) 20  MG tablet    3. NICM (nonischemic cardiomyopathy) (HCC)  I42.8 carvedilol  (COREG ) 25 MG tablet    4. Hypercholesterolemia  E78.00 atorvastatin  (LIPITOR) 40 MG tablet    5. Obesity hypoventilation syndrome (HCC)  E66.2      Assessment & Plan Hypertensive heart disease with heart failure and other cardiomyopathy Reports dyspnea on exertion, especially in humid conditions. Non-compliance with medication due to  cost, but has obtained a [facility] prescription card. Weight gain attributed to poor diet and inactivity. Not currently on Lasix . - Send prescriptions for atorvastatin , losartan , and [facility]. - Instruct to use Lasix  as needed for signs of fluid retention such as leg swelling or abdominal bloating. - Advise to maintain medication compliance and notify pharmacy if running low on medications. - There has been confusion about his medications as he had cost issues although the medications were for dollar prescriptions.  I resent the prescription for carvedilol . - He has not been taking Lasix  as he has not had any leg edema, advised him that we will keep it as a as needed if he has leg edema or weight gain >3 pounds in 3 days or significant abdominal bloating or dyspnea.  Obstructive sleep apnea (on BiPAP) Managed with BiPAP. Reports difficulty sleeping, achieving only 3-4 hours per night. Attempting compliance with BiPAP usage. - Encourage continued compliance with BiPAP usage.  Obesity hypoventilation syndrome with exercise-induced hypoxemia - Patient presently on 2 L oxygen with ambulation. Chest CT shows enlarged pulmonary artery- Suspect secondary pulm hypertension related to [facility] group 2 that is chronic diastolic heart failure.  He has had cardiac catheterization on 05/07/2021 revealing normal coronary arteries and normal right heart catheterization and cardiomyopathy felt to be secondary to uncontrolled hypertension.   Follow up: 2 months with APP for hypertension  management  Signed,  Jesse Bergamo, MD, [facility] 11/23/2023, 10:29 AM [facility] [address] [place], [place] [address] Phone: [phone]. Fax:  [phone]

## 2023-12-04 ENCOUNTER — Ambulatory Visit: Admitting: Nurse Practitioner

## 2023-12-11 ENCOUNTER — Encounter: Payer: Self-pay | Admitting: Nurse Practitioner

## 2023-12-11 ENCOUNTER — Ambulatory Visit: Admitting: Nurse Practitioner

## 2023-12-11 VITALS — BP 118/81 | HR 79 | Temp 98.8°F | Ht 72.0 in | Wt 346.0 lb

## 2023-12-11 DIAGNOSIS — J984 Other disorders of lung: Secondary | ICD-10-CM

## 2023-12-11 DIAGNOSIS — Z6841 Body Mass Index (BMI) 40.0 and over, adult: Secondary | ICD-10-CM

## 2023-12-11 DIAGNOSIS — G4733 Obstructive sleep apnea (adult) (pediatric): Secondary | ICD-10-CM

## 2023-12-11 DIAGNOSIS — I5020 Unspecified systolic (congestive) heart failure: Secondary | ICD-10-CM

## 2023-12-11 DIAGNOSIS — J4520 Mild intermittent asthma, uncomplicated: Secondary | ICD-10-CM

## 2023-12-11 DIAGNOSIS — J9611 Chronic respiratory failure with hypoxia: Secondary | ICD-10-CM

## 2023-12-11 MED ORDER — TIRZEPATIDE-WEIGHT MANAGEMENT 2.5 MG/0.5ML ~~LOC~~ SOLN: 2.5000 mg | SUBCUTANEOUS | Status: DC

## 2023-12-11 NOTE — Progress Notes (Signed)
 @Patient  ID: Jesse Morris, male    DOB: January 22, 1981, 43 y.o.   MRN: [id number]  Chief Complaint  Patient presents with   Obstructive Sleep Apnea    Sleep study     Referring provider: Rosalea Rosina SAILOR, PA  HPI: 43 year old male, former smoker followed for dyspnea, chronic cough, and chronic respiratory failure.  He is a patient of Dr. Reeves and last seen in office 06/12/2023.  Past medical history significant for HFrEF, NICM, CAD, hypertension, pulmonary hypertension, normocytic anemia, obesity, OSA.   TEST/EVENTS:  02/2023 echo: EF 45 to 50%.  Global hypokinesis.  LVH.  Unable to measure PASP.  Mild MR.  Aortic dilatation 05/16/2023 D-dimer negative 05/16/2023 eos 300 06/12/2023 FeNO 6 ppb 06/12/2023 exertional walk test  >> desaturated to 84% on room air; corrected with 2 L/min supplemental O2 08/08/2023 PFT: FVC 55, FEV1 52, ratio 84, TLC 70, DLCO 98. Positive BD (24%) 08/13/2023 split night: AHI 112/h severe OSA, SpO2 low 58% with mean 72% >> inadequate control on CPAP; final BiPAP 24/20, PS 0 with residual AHI 8.4/h, spO2 low 86%  06/12/2023: OV with Dr. Geronimo.  New consult referred by primary care physician.  Morbid obesity.  Having issues with shortness of breath.  Was admitted first week in March 2025 with respiratory failure deemed secondary to his systolic heart failure.  After that has followed up with cardiology.  Does have a history of sleep apnea but not using CPAP.  After discharge, has gotten better but still experiences shortness of breath walking short distances.  Also feels like with the pollen he is starting to cough more. FeNO 6 ppb.  Shortness of breath are likely related to heart failure, sleep apnea and also weight with physical deconditioning.  Rule out interstitial lung disease or airway disease such as asthma.  PFT, HRCT chest, RAST allergy panel ordered.  Start portable O2 at 2 L/min with activity.  Sleep consult.  08/08/2023: SHERLEAN with Christyl Osentoski NP Discussed the use of AI  scribe software for clinical note transcription with the patient, who gave verbal consent to proceed. Jesse Morris is a 43 year old male presents with shortness of breath and breathing difficulties.  He had a PFT today that revealed a moderate restrictive defect with normal diffusion capacity, reduced ERV and positive bronchodilator response (24% change).  No formal obstruction. He experiences shortness of breath that worsens with seasonal changes and exposure to pollen.  He also gets short winded just sitting at rest sometimes and feels like he has to take a big deep breath.  Symptoms overall are stable compared to his last visit.  His cough has improved some.  He experiences wheezing and chest tightness.  He has used inhalers in the past but does not recall that he is being very effective.  Thinks he tried Breo and Symbicort.  Did not do these for very long periods of time.  No fevers, chills, hemoptysis or increased lower extremity swelling.  Does have issues with orthopnea and PND. He has a history of sleep apnea, diagnosed during the COVID pandemic through a home study. He often feels like he is not getting enough air with his CPAP machine and and therefore has to remove it.  He is not currently using it.  Does feel tired all the time.  Snores loudly.  Has been told he stops breathing.  He does not drive and denies issues with morning headaches, sleepwalking. Not using sleep medications. He consumes caffeine in the mornings.  He  is currently on Wegovy for weight management. He had to stop working due to breathing difficulties. He uses a portable oxygen machine and reports that his oxygen levels drop when he is active but seem to do okay on the oxygen; although, he does not routinely use this. He did not have his oxygen with him today. Goes to sleep around 3 am. Takes a few hours to fall asleep. Wakes multiple times a night. Wake time varies. He has had a sleep study before but had difficulties tolerating  CPAP. Felt like he couldn't breath on it. Never had an in lab study. Drinks 2 beers a day. He's not currently working.   12/11/2023: Today - follow up Discussed the use of AI scribe software for clinical note transcription with the patient, who gave verbal consent to proceed.  History of Present Illness Jesse Morris is a 43 year old male with sleep apnea and congestive heart failure who presents for follow-up on BiPAP therapy and pulmonary hypertension.  He has been using BiPAP therapy since a sleep study in June revealed severe OSA. He uses the device about 70% of nights and achieves over four hours of use only 53% of the time. He experiences discomfort, describing a sensation of pressure building up behind his eyes, which sometimes leads him to remove the device during sleep. Despite these issues, he notes feeling 'a little bit' better the next day when he uses it for longer periods.  He has a history of congestive heart failure, diagnosed following an echocardiogram in 2024 and a right heart catheterization in 2023. He was found to have borderline pulmonary hypertension. He is under the care of a cardiologist who manages his fluid intake and medications. No leg swelling, palpitations, CP, orthopnea with PAP, PND.   He reports chronic dyspnea, which is stable from baseline. He has tried using an inhaler previously prescribed, but it did not alleviate his symptoms. He has also used albuterol , which did not provide relief and is now finished. No cough, wheezing, hemoptysis, fevers, chest congestion.   Regarding weight management, he was on [facility] for two months but had to stop due to communication issues with his doctor. He recently renewed his prescription but was informed that Medicaid no longer covers it for weight management. He has been denied disability twice and has hired a Clinical research associate to assist with the process.  11/09/2023-12/08/2023:  BiPAP 25/16, PS 2 21/30 days; 53% >4 hr; average use 5 hr 6  min Pressure IPAP 95th 19.4, EPAP 17.4 Leaks 50.6 AHI 3.1    No Known Allergies   There is no immunization history on file for this patient.  Past Medical History:  Diagnosis Date   CHF (congestive heart failure) (HCC)    Hypertension    Obesity    Sleep apnea     Tobacco History: Social History   Tobacco Use  Smoking Status Former   Current packs/day: 0.00   Average packs/day: 0.3 packs/day for 15.0 years (3.8 ttl pk-yrs)   Types: Cigarettes   Start date: 2000   Quit date: 2015   Years since quitting: 10.7  Smokeless Tobacco Never   Counseling given: Not Answered   Outpatient Medications Prior to Visit  Medication Sig Dispense Refill   atorvastatin  (LIPITOR) 40 MG tablet Take 1 tablet (40 mg total) by mouth daily. 90 tablet 1   carvedilol  (COREG ) 25 MG tablet Take 1 tablet (25 mg total) by mouth 2 (two) times daily with a meal. 180 tablet 1  furosemide  (LASIX ) 20 MG tablet Take 20 mg by mouth.     losartan  (COZAAR ) 100 MG tablet Take 1 tablet (100 mg total) by mouth daily. 90 tablet 1   furosemide  (LASIX ) 20 MG tablet Take 1 tablet (20 mg total) by mouth daily as needed for fluid (> 3 Lbs weigh gain in 3 days or marked shortness of breath). 30 tablet 2   Fluticasone-Umeclidin-Vilant (TRELEGY ELLIPTA ) 100-62.5-25 MCG/ACT AEPB Inhale 1 puff into the lungs daily. (Patient not taking: Reported on 12/11/2023) 60 each 5   No facility-administered medications prior to visit.     Review of Systems: As above     Physical Exam:  BP 118/81   Pulse 79   Temp 98.8 F (37.1 C) (Oral)   Ht 6' (1.829 m)   Wt (!) 346 lb (156.9 kg)   SpO2 95%   BMI 46.93 kg/m   GEN: Pleasant, interactive, well-kempt; morbidly obese; in no acute distress HEENT:  Normocephalic and atraumatic. PERRLA. Sclera white. Nasal turbinates pink, moist and patent bilaterally. No rhinorrhea present. Oropharynx pink and moist, without exudate or edema. No lesions, ulcerations, or postnasal drip.   NECK:  Supple w/ fair ROM. No JVD present. Normal carotid impulses w/o bruits. Thyroid symmetrical with no goiter or nodules palpated. No lymphadenopathy.   CV: RRR, no m/r/g, no peripheral edema.  PULMONARY:  Unlabored, regular breathing. Clear bilaterally A&P w/o wheezes/rales/rhonchi. No accessory muscle use.  GI: BS present and normoactive. Soft, non-tender to palpation.  MSK: No erythema, warmth or tenderness. Cap refil <2 sec all extrem.  Neuro: A/Ox3. No focal deficits noted.   Skin: Warm, no lesions or rashe Psych: Normal affect and behavior. Judgement and thought content appropriate.     Lab Results:  CBC    Component Value Date/Time   WBC 6.7 05/18/2023 0810   RBC 5.04 05/18/2023 0810   HGB 14.9 05/18/2023 0810   HCT 49.7 05/18/2023 0810   PLT 286 05/18/2023 0810   MCV 98.6 05/18/2023 0810   MCH 29.6 05/18/2023 0810   MCHC 30.0 05/18/2023 0810   RDW 15.9 (H) 05/18/2023 0810   LYMPHSABS 1.6 05/16/2023 0511   MONOABS 0.5 05/16/2023 0511   EOSABS 0.3 05/16/2023 0511   BASOSABS 0.0 05/16/2023 0511    BMET    Component Value Date/Time   NA 138 05/18/2023 0423   K 3.8 05/18/2023 0423   CL 98 05/18/2023 0423   CO2 27 05/18/2023 0423   GLUCOSE 100 (H) 05/18/2023 0423   BUN 19 05/18/2023 0423   CREATININE 1.05 05/18/2023 0423   CALCIUM  8.7 (L) 05/18/2023 0423   GFRNONAA >60 05/18/2023 0423   GFRAA >60 07/19/2016 1249    BNP    Component Value Date/Time   BNP 49.1 05/15/2023 2211     Imaging:  No results found.  Administration History     None          Latest Ref Rng & Units 08/08/2023    2:45 PM  PFT Results  FVC-Pre L 3.11   FVC-Predicted Pre % 55   FVC-Post L 3.42   FVC-Predicted Post % 61   Pre FEV1/FVC % % 74   Post FEV1/FCV % % 84   FEV1-Pre L 2.31   FEV1-Predicted Pre % 52   FEV1-Post L 2.88   DLCO uncorrected ml/min/mmHg 32.07   DLCO UNC% % 98   DLCO corrected ml/min/mmHg 32.07   DLCO COR %Predicted % 98   DLVA Predicted % 142  TLC L 5.18   TLC % Predicted % 70   RV % Predicted % 87     Lab Results  Component Value Date   NITRICOXIDE 6 06/12/2023        Assessment & Plan:   Severe obstructive sleep apnea Very severe OSA, on BiPAP. Good control. Suboptimal compliance due to tolerance and pressure issues. Moderate to large leaks. Reduced his IPAP and EPAP settings. Advised to notify if issues persist. Will reassess at follow up. Reviewed risks of untreated severe OSA and correlation to his daytime fatigue, dyspnea, and cardiovascular disease. Encouraged to work on increasing usage. Verbalized understanding. Understands proper care/use of device. Healthy weight loss encouraged. Safe driving practices reviewed.  Since Georjean is no longer covered for weight management on Medicaid, appropriate to transition to Zepbound. Patient has severe sleep apnea related to obesity with BMI 46 , posing significant cardiovascular risks. Patient has failed traditional weight loss measures with diet and exercise for >/6 months. Patient will be initiated on Zepbound (tirzepatide) for weight management. Zepbound is the only pharmaceutical treatment approved for moderate-to-severe OSA in adults who are overweight (BMI >/27) or obese (BMI >/30). The patient will continue lifestyle modifications, including structured nutrition and physical activity as directed. No other GLP1 therapy will be used simultaneously at this time. The patient does not have any FDA labeled contraindications to this agent, including pregnancy, lactation, hx or family history of medullary thyroid cancer, or multiple endocrine neoplasia type II. Side effect profile has been reviewed with patient. Aware of red flag symptoms to notify of immediately or seek emergency care, including severe nausea/vomiting, inability to pass bowels or gas, severe abdominal pain/tenderness, jaundice.    Patient Instructions  High resolution CT chest did not show any findings of lung  disease. You did have some mild air trapping, which you can see with asthma; however, you haven't received any benefit from inhalers so very low suspicion this is contributing to your breathing issues  Increase use of BiPAP every night, minimum of 4-6 hours a night.  Change equipment as directed. Wash your tubing with warm soap and water daily, hang to dry. Wash humidifier portion weekly. Use bottled, distilled water and change daily Be aware of reduced alertness and do not drive or operate heavy machinery if experiencing this or drowsiness.  Exercise encouraged, as tolerated. Healthy weight management discussed.  Avoid or decrease alcohol consumption and medications that make you more sleepy, if possible. Notify if persistent daytime sleepiness occurs even with consistent use of PAP therapy.  Change CPAP supplies... Every month Mask cushions and/or nasal pillows CPAP machine filters Every 3 months Mask frame (not including the headgear) CPAP tubing Every 6 months Mask headgear Chin strap (if applicable) Humidifier water tub  Adjust settings to IPAP max 19, EPAP min 12, PS 2   We discussed how untreated sleep apnea puts an individual at risk for cardiac arrhthymias, pulm HTN, DM, stroke and increases their risk for daytime accidents.    Use caution when driving and pull over if you become sleepy.  Start Zepbound at 2.5 mg - inject one dose once a week for 4 weeks. After this and if you are doing well, we will increase you every 4 weeks with goal of getting to 10-15 mg once weekly for maintenance  Work on diet measures and exercise - 150 min/week. 30 minutes of strength training 3-5 days a week  We reviewed emergent symptoms to notify of immediately or seek emergency care, including severe  nausea/vomiting, inability to pass bowels or gas, severe abdominal pain/tenderness, jaundice, swelling of the face/tongue.  Call if you are having difficulties with any side effects so we can help  manage these  Labs today    Follow up in 6 weeks with Katie Sarae Nicholes,NP to see how Zebound is working and check on BiPAP changes. If symptoms do not improve or worsen, please contact office for sooner follow up or seek emergency care.      Restrictive lung disease Due to body habitus given reduced ERV and normal diffusion capacity.  HRCT without ILD.   Mild intermittent asthma Air trapping and reversibility on prior testing. No perceived benefit from inhaler therapy. Low suspicion asthma driving factor of DOE. Action plan in place  HFrEF (heart failure with reduced ejection fraction) (HCC) EF 40 to 50%.  Nonischemic cardiomyopathy.  Contributing to dyspnea.  Follow-up with cardiology as scheduled.  Euvolemic on exam.  Chronic respiratory failure with hypoxia (HCC) Exertional hypoxia requiring 2 L minute supplemental O2.  Stable on room air today.  Encouraged to utilize oxygen consistently with activity. No supplemental O2 required with BiPAP therapy.  See above plan.  Goal greater than 88 to 90%.    Advised if symptoms do not improve or worsen, to please contact office for sooner follow up or seek emergency care.   I spent 35 minutes of dedicated to the care of this patient on the date of this encounter to include pre-visit review of records, face-to-face time with the patient discussing conditions above, post visit ordering of testing, clinical documentation with the electronic health record, making appropriate referrals as documented, and communicating necessary findings to members of the patients care team.  Comer LULLA Rouleau, NP 12/13/2023  Pt aware and understands NP's role.

## 2023-12-11 NOTE — Patient Instructions (Addendum)
 High resolution CT chest did not show any findings of lung disease. You did have some mild air trapping, which you can see with asthma; however, you haven't received any benefit from inhalers so very low suspicion this is contributing to your breathing issues  Increase use of BiPAP every night, minimum of 4-6 hours a night.  Change equipment as directed. Wash your tubing with warm soap and water daily, hang to dry. Wash humidifier portion weekly. Use bottled, distilled water and change daily Be aware of reduced alertness and do not drive or operate heavy machinery if experiencing this or drowsiness.  Exercise encouraged, as tolerated. Healthy weight management discussed.  Avoid or decrease alcohol consumption and medications that make you more sleepy, if possible. Notify if persistent daytime sleepiness occurs even with consistent use of PAP therapy.  Change CPAP supplies... Every month Mask cushions and/or nasal pillows CPAP machine filters Every 3 months Mask frame (not including the headgear) CPAP tubing Every 6 months Mask headgear Chin strap (if applicable) Humidifier water tub  Adjust settings to IPAP max 19, EPAP min 12, PS 2   We discussed how untreated sleep apnea puts an individual at risk for cardiac arrhthymias, pulm HTN, DM, stroke and increases their risk for daytime accidents.    Use caution when driving and pull over if you become sleepy.  Start Zepbound at 2.5 mg - inject one dose once a week for 4 weeks. After this and if you are doing well, we will increase you every 4 weeks with goal of getting to 10-15 mg once weekly for maintenance  Work on diet measures and exercise - 150 min/week. 30 minutes of strength training 3-5 days a week  We reviewed emergent symptoms to notify of immediately or seek emergency care, including severe nausea/vomiting, inability to pass bowels or gas, severe abdominal pain/tenderness, jaundice, swelling of the face/tongue.  Call if you are  having difficulties with any side effects so we can help manage these  Labs today    Follow up in 6 weeks with Katie Macintyre Alexa,NP to see how Zebound is working and check on BiPAP changes. If symptoms do not improve or worsen, please contact office for sooner follow up or seek emergency care.

## 2023-12-13 ENCOUNTER — Encounter: Payer: Self-pay | Admitting: Nurse Practitioner

## 2023-12-13 NOTE — Assessment & Plan Note (Signed)
 Very severe OSA, on BiPAP. Good control. Suboptimal compliance due to tolerance and pressure issues. Moderate to large leaks. Reduced his IPAP and EPAP settings. Advised to notify if issues persist. Will reassess at follow up. Reviewed risks of untreated severe OSA and correlation to his daytime fatigue, dyspnea, and cardiovascular disease. Encouraged to work on increasing usage. Verbalized understanding. Understands proper care/use of device. Healthy weight loss encouraged. Safe driving practices reviewed.  Since Jesse Morris is no longer covered for weight management on Medicaid, appropriate to transition to Zepbound. Patient has severe sleep apnea related to obesity with BMI 46 , posing significant cardiovascular risks. Patient has failed traditional weight loss measures with diet and exercise for >/6 months. Patient will be initiated on Zepbound (tirzepatide) for weight management. Zepbound is the only pharmaceutical treatment approved for moderate-to-severe OSA in adults who are overweight (BMI >/27) or obese (BMI >/30). The patient will continue lifestyle modifications, including structured nutrition and physical activity as directed. No other GLP1 therapy will be used simultaneously at this time. The patient does not have any FDA labeled contraindications to this agent, including pregnancy, lactation, hx or family history of medullary thyroid cancer, or multiple endocrine neoplasia type II. Side effect profile has been reviewed with patient. Aware of red flag symptoms to notify of immediately or seek emergency care, including severe nausea/vomiting, inability to pass bowels or gas, severe abdominal pain/tenderness, jaundice.    Patient Instructions  High resolution CT chest did not show any findings of lung disease. You did have some mild air trapping, which you can see with asthma; however, you haven't received any benefit from inhalers so very low suspicion this is contributing to your breathing  issues  Increase use of BiPAP every night, minimum of 4-6 hours a night.  Change equipment as directed. Wash your tubing with warm soap and water daily, hang to dry. Wash humidifier portion weekly. Use bottled, distilled water and change daily Be aware of reduced alertness and do not drive or operate heavy machinery if experiencing this or drowsiness.  Exercise encouraged, as tolerated. Healthy weight management discussed.  Avoid or decrease alcohol consumption and medications that make you more sleepy, if possible. Notify if persistent daytime sleepiness occurs even with consistent use of PAP therapy.  Change CPAP supplies... Every month Mask cushions and/or nasal pillows CPAP machine filters Every 3 months Mask frame (not including the headgear) CPAP tubing Every 6 months Mask headgear Chin strap (if applicable) Humidifier water tub  Adjust settings to IPAP max 19, EPAP min 12, PS 2   We discussed how untreated sleep apnea puts an individual at risk for cardiac arrhthymias, pulm HTN, DM, stroke and increases their risk for daytime accidents.    Use caution when driving and pull over if you become sleepy.  Start Zepbound at 2.5 mg - inject one dose once a week for 4 weeks. After this and if you are doing well, we will increase you every 4 weeks with goal of getting to 10-15 mg once weekly for maintenance  Work on diet measures and exercise - 150 min/week. 30 minutes of strength training 3-5 days a week  We reviewed emergent symptoms to notify of immediately or seek emergency care, including severe nausea/vomiting, inability to pass bowels or gas, severe abdominal pain/tenderness, jaundice, swelling of the face/tongue.  Call if you are having difficulties with any side effects so we can help manage these  Labs today    Follow up in 6 weeks with Jesse Taiyo Kozma,NP to see  how Jesse Morris is working and check on BiPAP changes. If symptoms do not improve or worsen, please contact office for  sooner follow up or seek emergency care.

## 2023-12-13 NOTE — Assessment & Plan Note (Signed)
 Air trapping and reversibility on prior testing. No perceived benefit from inhaler therapy. Low suspicion asthma driving factor of DOE. Action plan in place

## 2023-12-13 NOTE — Assessment & Plan Note (Signed)
 Due to body habitus given reduced ERV and normal diffusion capacity.  HRCT without ILD.

## 2023-12-13 NOTE — Assessment & Plan Note (Signed)
 EF 40 to 50%.  Nonischemic cardiomyopathy.  Contributing to dyspnea.  Follow-up with cardiology as scheduled.  Euvolemic on exam.

## 2023-12-13 NOTE — Assessment & Plan Note (Addendum)
 Exertional hypoxia requiring 2 L minute supplemental O2.  Stable on room air today.  Encouraged to utilize oxygen consistently with activity. No supplemental O2 required with BiPAP therapy.  See above plan.  Goal greater than 88 to 90%.

## 2023-12-18 NOTE — Progress Notes (Signed)
 Called and spoke with pt - advised of CT results per Dr. Geronimo. Pt verbalized understanding, NFN.

## 2024-01-20 ENCOUNTER — Other Ambulatory Visit (HOSPITAL_COMMUNITY): Payer: Self-pay

## 2024-01-22 ENCOUNTER — Encounter: Payer: Self-pay | Admitting: Emergency Medicine

## 2024-01-22 ENCOUNTER — Ambulatory Visit: Attending: Student in an Organized Health Care Education/Training Program | Admitting: Emergency Medicine

## 2024-01-22 VITALS — BP 140/100 | HR 90 | Ht 72.0 in | Wt 342.0 lb

## 2024-01-22 DIAGNOSIS — I340 Nonrheumatic mitral (valve) insufficiency: Secondary | ICD-10-CM | POA: Insufficient documentation

## 2024-01-22 DIAGNOSIS — I10 Essential (primary) hypertension: Secondary | ICD-10-CM | POA: Insufficient documentation

## 2024-01-22 DIAGNOSIS — G4733 Obstructive sleep apnea (adult) (pediatric): Secondary | ICD-10-CM | POA: Insufficient documentation

## 2024-01-22 DIAGNOSIS — I5022 Chronic systolic (congestive) heart failure: Secondary | ICD-10-CM | POA: Diagnosis not present

## 2024-01-22 DIAGNOSIS — I428 Other cardiomyopathies: Secondary | ICD-10-CM | POA: Insufficient documentation

## 2024-01-22 DIAGNOSIS — E7800 Pure hypercholesterolemia, unspecified: Secondary | ICD-10-CM | POA: Insufficient documentation

## 2024-01-22 DIAGNOSIS — I110 Hypertensive heart disease with heart failure: Secondary | ICD-10-CM | POA: Diagnosis present

## 2024-01-22 MED ORDER — SPIRONOLACTONE 25 MG PO TABS: 25.0000 mg | ORAL_TABLET | Freq: Every day | ORAL | 3 refills | Status: DC

## 2024-01-22 NOTE — Progress Notes (Signed)
 Cardiology Office Note:    Date:  01/22/2024  ID:  Nicolaus Andel, DOB Aug 05, 1980, MRN [id number] PCP: Rosalea Rosina SAILOR, PA  [facility] Providers Cardiologist:  Gordy Bergamo, MD Cardiology APP:  Rana Lum CROME, NP       Patient Profile:       Chief Complaint: 33-month follow-up History of Present Illness:  Jesse Morris is a 43 y.o. male with visit-pertinent history of HFmrEF, nonischemic cardiomyopathy, mitral regurgitation, hypertension, morbid obesity, hyperlipidemia, tobacco abuse, obstructive sleep apnea, coronary artery disease, mild aortic dilation  Initially established with cardiology during admission 04/2021 during admission for dyspnea and hypoxia. O2 sats upon arrival 88%. History of smoking cigarettes and THC. 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 Had recent Influenza A infection. TTE during admission with EF 45-50%, mild LVE, moderate LVH, normal diastolic parameters and RV and no signs of pulmonary HTN. No obvious shunt but bubble study not done. CTA with no PE. Prior diagnosis of sleep apnea but not on CPAP. Troponins were elevated.  Underwent right and left heart cath on 04/17/2021 which revealed minimal CAD with 15% stenosis in mid to distal LAD, mildly elevated LVEDP, high cardiac output, borderline pulm HTN. GDMT for CHF limited by financial concerns. Was discharged with CPAP.    Admission 10/21/2021 for SOB and chest pain. Hypertensive urgency with BP 198/147, lost his job and was not taking his medications. Seen by Dr. Levern during admission. Troponins elevated but flat. He was not compliant with CPAP.    Seen by Dr. Bergamo in clinic 08/10/22 to establish cardiology care. Nonischemic cardiomyopathy felt to be secondary to uncontrolled hypertension. Metoprolol  was changed to carvedilol  and he was advised to discontinue albuterol  and Breo Ellipta prescribed for dyspnea felt to be related to CHF rather than COPD or emphysema. Volume  status was stable. He was encouraged to work on lifestyle modification and BP control and return in 6-8 weeks for follow-up.    ED visit 02/2023 for worsening shortness of breath and chest pain. BNP elevated and he was admitted for IV diuresis. Troponins consistently elevated, echo revealed LVEF 45-50%, no significant changes from prior echo. He left AMA.    Admission 05/16/2023, seen by cardiology for chest pain and shortness of breath. He reported orthopnea and and exertional SOB. Chest pain described as sharp, 8/10 and radiating down left arm SpO2 in the 80s, BP 228/122. EKG showed sinus rhythm, left atrial enlargement, rate of 90. CXR showed mild cardiomegaly, normal pulmonary vasculature. Troponin 128 >> 127. Reported he was working at [employer]. Had CPAP but it was in storage. LDL was 136 and atorvastatin  was resumed. Troponin elevation felt to be 2/2 volume overload and hypertensive urgency. He was discharged on furosemide  20 mg daily, losartan  50 mg daily, and carvedilol  6.25 mg twice daily. Consider Entresto .   Was last seen in clinic on 11/22/2023 by Dr. Bergamo.  Experiences difficult managing medications due to cost.  This is led to irregular intake of atorvastatin , losartan , and carvedilol .  He had obtained the [facility] prescription card and he is medications were refilled.  He was to follow-up in 2 months.  Discussed the use of AI scribe software for clinical note transcription with the patient, who gave verbal consent to proceed.  History of Present Illness Jesse Morris is a 43 year old male with hypertensive heart failure who presents for follow-up for blood pressure management.   He experiences persistent shortness of breath and uses continuous  oxygen therapy during sleep and on exertion. His breathing remains unchanged. He is on carvedilol , losartan , Lasix , and atorvastatin  for cholesterol and blood pressure management.  He is not currently taking his blood pressure as he has misplaced  his blood pressure cuff.  Denies orthopnea when wearing BiPAP.  No PND or LEE.  He experiences chest pains, particularly at night, which he associates with stress and anxiety. He has a history of cardiac catheterization and reports random chest pains not linked to exertion. He uses a BiPAP machine for 4-6 hours nightly.  He has gained weight due to depression and lack of work, impacting his health condition. He is on Zepbound for weight management and has lost 10 pounds since his last visit. He attempts to exercise by walking but finds it challenging due to living on the third floor. He has applied for disability and faces financial difficulties, affecting his living situation.  Review of systems:  Please see the history of present illness. All other systems are reviewed and otherwise negative.      Studies Reviewed:        Echocardiogram 03/02/2023 1. Technically difficult study with limited views. Left ventricular  ejection fraction, by estimation, is 45 to 50%. The left ventricle has  mildly decreased function. The left ventricle demonstrates global  hypokinesis. There is mild left ventricular  hypertrophy. Left ventricular diastolic parameters are indeterminate.   2. Right ventricular systolic function was not well visualized. The right  ventricular size is not well visualized. Tricuspid regurgitation signal is  inadequate for assessing PA pressure.   3. The mitral valve is grossly normal. Mild mitral valve regurgitation.   4. The aortic valve was not well visualized. Aortic valve regurgitation  is not visualized. No aortic stenosis is present.   5. Aortic dilatation noted. There is dilatation of the aortic root,  measuring 42 mm. There is dilatation of the ascending aorta, measuring 39  mm.   Echocardiogram 10/21/2021  1. Left ventricular ejection fraction, by estimation, is 45 to 50%. The  left ventricle has mildly decreased function. The left ventricle  demonstrates global  hypokinesis. There is mild concentric left ventricular  hypertrophy. Left ventricular diastolic  parameters are consistent with Grade I diastolic dysfunction (impaired  relaxation).   2. Right ventricular systolic function is normal. The right ventricular  size is normal.   3. The mitral valve is normal in structure. No evidence of mitral valve  regurgitation.   4. The aortic valve is normal in structure. Aortic valve regurgitation is  not visualized.   5. Aortic dilatation noted. There is borderline dilatation of the  ascending aorta.   6. The inferior vena cava is normal in size with <50% respiratory  variability, suggesting right atrial pressure of 8 mmHg.   Cardiac catheterization 04/17/2021   Mid LAD to Dist LAD lesion is 15% stenosed.   1. Normal PCWP and RA pressure, mildly elevated LVEDP.  2. High cardiac output.  3. Borderline pulmonary hypertension.  4. Minimal CAD   Nonischemic cardiomyopathy.  Needs workup for sleep apnea.  Diagnostic Dominance: Left  Risk Assessment/Calculations:     HYPERTENSION CONTROL Vitals:   01/22/24 1525 01/22/24 1549  BP: (!) 122/90 (!) 140/100    The patient's blood pressure is elevated above target today.  In order to address the patient's elevated BP: A new medication was prescribed today.           Physical Exam:   VS:  BP (!) 140/100   Pulse  90   Ht 6' (1.829 m)   Wt (!) 342 lb (155.1 kg)   BMI 46.38 kg/m    Wt Readings from Last 3 Encounters:  01/22/24 (!) 342 lb (155.1 kg)  12/11/23 (!) 346 lb (156.9 kg)  11/22/23 (!) 352 lb (159.7 kg)    GEN: Well nourished, well developed in no acute distress NECK: No JVD; No carotid bruits CARDIAC: RRR, no murmurs, rubs, gallops RESPIRATORY:  Clear to auscultation without rales, wheezing or rhonchi  ABDOMEN: Soft, non-tender, non-distended EXTREMITIES:  No edema; No acute deformity      Assessment and Plan:  HFmrEF Nonischemic cardiomyopathy Hypertensive heart  disease Echocardiogram 02/2023 with LVEF 45 to 50%, global hypokinesis, mild LVH R/LHC 04/2021 with nonobstructive disease Cardiomyopathy thought to be due to uncontrolled hypertension History of exertional hypoxia requiring 2L supplemental O2 - Today he is euvolemic and well compensated exam  - No changes to chronic dyspnea.  No orthopnea on BiPAP, PND, LEE - Has had weight loss on Zepbound - Blood pressure today remains elevated at 140/100 - Start spironolactone  25 mg daily - Continue carvedilol  25 mg twice daily - Continue losartan  100 mg daily - Continue furosemide  20 mg daily - Continue Zepbound - BMET today and repeat BMET x 2 weeks  Mild coronary artery disease Hyperlipidemia, LDL goal <70 R/LHC 04/2021 showed mid LAD to distal LAD 15% stenosis LDL 136 on 05/2023 - Today he is without symptoms suggestive of active angina.  No indication for ischemic evaluation at this time - Repeat fasting lipid panel and LFTs - Continue atorvastatin  40 mg daily  Hypertension Blood pressure today is uncontrolled at 140/100 - Start spironolactone  25 mg daily - Continue HF GDMT   Severe obstructive sleep apnea - Adherent and managed with BiPAP  Mild mitral valve regurgitation Echocardiogram 02/2023 showed mild mitral valve regurgitation - No interventions warranted at this time  Chronic respiratory failure with hypoxia HRCT without ILD - On supplemental 2L O2 for exertion - Today he is stable on room air - Management per pulmonology  Morbid obesity BMI 46.38 - Managed with Zepbound with weight loss of 10 pounds over the past 2 months - Discussed heart healthy dieting and daily physical exercise      Dispo:  Return in about 2 months (around 03/23/2024).  Signed, Lum LITTIE Louis, NP

## 2024-01-22 NOTE — Patient Instructions (Addendum)
 Medication Instructions:  START TAKING SPIRONOLACTONE  25 MG DAILY.  Lab Work: BMET TO BE DONE TODAY. FASTING LIPID PANEL AND CMET TO BE DONE IN 2 WEEKS. (PLEASE BE FASTING FOR THESE LABS!!)  Testing/Procedures: NONE  Follow-Up: At [facility], you and your health needs are our priority.  As part of our continuing mission to provide you with exceptional heart care, our providers are all part of one team.  This team includes your primary Cardiologist (physician) and Advanced Practice Providers or APPs (Physician Assistants and Nurse Practitioners) who all work together to provide you with the care you need, when you need it.  Your next appointment:   2 MONTHS  Provider:   MADISON FOUNTAIN, NP  We recommend signing up for the patient portal called MyChart.  Sign up information is provided on this After Visit Summary.  MyChart is used to connect with patients for Virtual Visits (Telemedicine).  Patients are able to view lab/test results, encounter notes, upcoming appointments, etc.  Non-urgent messages can be sent to your provider as well.   To learn more about what you can do with MyChart, go to [email or web address].

## 2024-01-23 ENCOUNTER — Ambulatory Visit: Payer: Self-pay | Admitting: Emergency Medicine

## 2024-01-23 DIAGNOSIS — Z79899 Other long term (current) drug therapy: Secondary | ICD-10-CM

## 2024-01-23 LAB — BASIC METABOLIC PANEL WITH GFR
BUN/Creatinine Ratio: 9 (ref 9–20)
BUN: 11 mg/dL (ref 6–24)
CO2: 29 mmol/L (ref 20–29)
Calcium: 9.4 mg/dL (ref 8.7–10.2)
Chloride: 96 mmol/L (ref 96–106)
Creatinine, Ser: 1.16 mg/dL (ref 0.76–1.27)
Glucose: 114 mg/dL — ABNORMAL HIGH (ref 70–99)
Potassium: 4.2 mmol/L (ref 3.5–5.2)
Sodium: 140 mmol/L (ref 134–144)
eGFR: 80 mL/min/1.73 (ref >59)

## 2024-01-28 ENCOUNTER — Other Ambulatory Visit (HOSPITAL_COMMUNITY): Payer: Self-pay

## 2024-01-29 ENCOUNTER — Encounter: Payer: Self-pay | Admitting: Nurse Practitioner

## 2024-01-29 ENCOUNTER — Ambulatory Visit: Admitting: Nurse Practitioner

## 2024-01-29 VITALS — BP 144/102 | HR 94 | Temp 98.4°F | Ht 72.0 in | Wt 339.0 lb

## 2024-01-29 DIAGNOSIS — G4733 Obstructive sleep apnea (adult) (pediatric): Secondary | ICD-10-CM

## 2024-01-29 DIAGNOSIS — Z6841 Body Mass Index (BMI) 40.0 and over, adult: Secondary | ICD-10-CM

## 2024-01-29 DIAGNOSIS — E66813 Obesity, class 3: Secondary | ICD-10-CM

## 2024-01-29 MED ORDER — ZEPBOUND 5 MG/0.5ML ~~LOC~~ SOAJ: 5.0000 mg | SUBCUTANEOUS | 0 refills | Status: DC

## 2024-01-29 MED ORDER — ZEPBOUND 7.5 MG/0.5ML ~~LOC~~ SOAJ: 7.5000 mg | SUBCUTANEOUS | 0 refills | Status: DC

## 2024-01-29 NOTE — Assessment & Plan Note (Addendum)
 Starting BMI 46 prior to Zepbound .  See above

## 2024-01-29 NOTE — Patient Instructions (Addendum)
 Increase use of BiPAP every night, minimum of 4-6 hours a night.  Change equipment as directed. Wash your tubing with warm soap and water daily, hang to dry. Wash humidifier portion weekly. Use bottled, distilled water and change daily Be aware of reduced alertness and do not drive or operate heavy machinery if experiencing this or drowsiness.  Exercise encouraged, as tolerated. Healthy weight management discussed.  Avoid or decrease alcohol consumption and medications that make you more sleepy, if possible. Notify if persistent daytime sleepiness occurs even with consistent use of PAP therapy.   Change CPAP supplies... Every month Mask cushions and/or nasal pillows CPAP machine filters Every 3 months Mask frame (not including the headgear) CPAP tubing Every 6 months Mask headgear Chin strap (if applicable) Humidifier water tub   Adjust settings to IPAP max 17, EPAP min 12, PS 2   We discussed how untreated sleep apnea puts an individual at risk for cardiac arrhthymias, pulm HTN, DM, stroke and increases their risk for daytime accidents.    Use caution when driving and pull over if you become sleepy.   Continue Zepbound and increase to 5 mg dose weekly for the next 4 weeks. You can start this injection on Sunday, 11/23 - inject one dose once a week for 4 weeks then increase to the 7.5 mg injection once weekly on 12/21, as long as you are still tolerating the medication well. After this and if you are doing well, we will increase you again with a goal of getting to 10-15 mg once weekly for maintenance  Work on diet measures and exercise - 150 min/week. 30 minutes of strength training 3-5 days a week  We reviewed emergent symptoms to notify of immediately or seek emergency care, including severe nausea/vomiting, inability to pass bowels or gas, severe abdominal pain/tenderness, jaundice, swelling of the face/tongue.  Call if you are having difficulties with any side effects so we can help  manage these   Follow up in 8 weeks with Dr. Neda (new sleep pt 30 minute slot) to see how Zebound is working and check on BiPAP. If symptoms do not improve or worsen, please contact office for sooner follow up or seek emergency care.  Notify your provider if you are planning to have a procedure/surgery, as this medication will need to be stopped prior.

## 2024-01-29 NOTE — Assessment & Plan Note (Signed)
 Very severe OSA, on BiPAP. Good control. Improved compliance. Encouraged to continue increasing usage to nightly goal and minimum of 70% of the nights >4 hr. Improved tolerance with decreased pressures. Occasionally still feels they are too strong. Average IPAP 16. Will reduce IPAP max to 17 cmH2O. Advised to notify if issues persist. Will reassess at follow up. Reviewed risks of untreated severe OSA and correlation to his daytime fatigue, dyspnea, and cardiovascular disease. Encouraged to continue working on increasing usage. Verbalized understanding. Understands proper care/use of device. Healthy weight loss encouraged. Safe driving practices reviewed.  Patient has severe sleep apnea related to obesity with BMI 46 , posing significant cardiovascular risks. Patient has failed traditional weight loss measures with diet and exercise for >/6 months. Patient will continue on Zepbound (tirzepatide) for weight management. Zepbound is the only pharmaceutical treatment approved for moderate-to-severe OSA in adults who are overweight (BMI >/27) or obese (BMI >/30). The patient will continue lifestyle modifications, including structured nutrition and physical activity as directed. No other GLP1 therapy will be used simultaneously at this time. The patient does not have any FDA labeled contraindications to this agent, including pregnancy, lactation, hx or family history of medullary thyroid cancer, or multiple endocrine neoplasia type II. Side effect profile has been reviewed with patient. Aware of red flag symptoms to notify of immediately or seek emergency care, including severe nausea/vomiting, inability to pass bowels or gas, severe abdominal pain/tenderness, jaundice.  Tolerating well. Will begin titrating up dose by 2.5 mg every 4 weeks, as tolerated. Reviewed side effect profile again today. Will reassess at follow up.  Patient Instructions  Increase use of BiPAP every night, minimum of 4-6 hours a night.   Change equipment as directed. Wash your tubing with warm soap and water daily, hang to dry. Wash humidifier portion weekly. Use bottled, distilled water and change daily Be aware of reduced alertness and do not drive or operate heavy machinery if experiencing this or drowsiness.  Exercise encouraged, as tolerated. Healthy weight management discussed.  Avoid or decrease alcohol consumption and medications that make you more sleepy, if possible. Notify if persistent daytime sleepiness occurs even with consistent use of PAP therapy.   Change CPAP supplies... Every month Mask cushions and/or nasal pillows CPAP machine filters Every 3 months Mask frame (not including the headgear) CPAP tubing Every 6 months Mask headgear Chin strap (if applicable) Humidifier water tub   Adjust settings to IPAP max 17, EPAP min 12, PS 2   We discussed how untreated sleep apnea puts an individual at risk for cardiac arrhthymias, pulm HTN, DM, stroke and increases their risk for daytime accidents.    Use caution when driving and pull over if you become sleepy.   Continue Zepbound and increase to 5 mg dose weekly for the next 4 weeks. You can start this injection on Sunday, 11/23 - inject one dose once a week for 4 weeks then increase to the 7.5 mg injection once weekly on 12/21, as long as you are still tolerating the medication well. After this and if you are doing well, we will increase you again with a goal of getting to 10-15 mg once weekly for maintenance  Work on diet measures and exercise - 150 min/week. 30 minutes of strength training 3-5 days a week  We reviewed emergent symptoms to notify of immediately or seek emergency care, including severe nausea/vomiting, inability to pass bowels or gas, severe abdominal pain/tenderness, jaundice, swelling of the face/tongue.  Call if you are having  difficulties with any side effects so we can help manage these   Follow up in 8 weeks with Dr. Neda (new sleep  pt 30 minute slot) to see how Zebound is working and check on BiPAP. If symptoms do not improve or worsen, please contact office for sooner follow up or seek emergency care.  Notify your provider if you are planning to have a procedure/surgery, as this medication will need to be stopped prior.

## 2024-01-29 NOTE — Progress Notes (Signed)
 @Patient  ID: Jesse Morris, male    DOB: [birth year], 43 y.o.   MRN: [id number]  Chief Complaint  Patient presents with   Obstructive Sleep Apnea    States he las left 10lbs with Zepbound, no side effects after first injection. States he feels like BiPAP pressure is still too high sometimes. States asthma is making him cough.    Referring provider: Rosalea Rosina SAILOR, PA  HPI: 43 year old male, former smoker followed for dyspnea, chronic cough, and chronic respiratory failure.  He is a patient of Dr. Reeves and last seen in office 12/11/2023 by [facility] NP.  Past medical history significant for HFrEF, NICM, CAD, hypertension, pulmonary hypertension, normocytic anemia, obesity, OSA.   TEST/EVENTS:  02/2023 echo: EF 45 to 50%.  Global hypokinesis.  LVH.  Unable to measure PASP.  Mild MR.  Aortic dilatation 05/16/2023 D-dimer negative 05/16/2023 eos 300 06/12/2023 FeNO 6 ppb 06/12/2023 exertional walk test  >> desaturated to 84% on room air; corrected with 2 L/min supplemental O2 08/08/2023 PFT: FVC 55, FEV1 52, ratio 84, TLC 70, DLCO 98. Positive BD (24%) 08/13/2023 split night: AHI 112/h severe OSA, SpO2 low 58% with mean 72% >> inadequate control on CPAP; final BiPAP 24/20, PS 0 with residual AHI 8.4/h, spO2 low 86% 08/13/2023 HRCT chest: enlarged pulmonic trunk. Negative for ILD. 3 mm LLL nodule, unchanged. Air trapping. Slight nodular thickening of the left adrenal gland.   06/12/2023: OV with Dr. Geronimo.  New consult referred by primary care physician.  Morbid obesity.  Having issues with shortness of breath.  Was admitted first week in March 2025 with respiratory failure deemed secondary to his systolic heart failure.  After that has followed up with cardiology.  Does have a history of sleep apnea but not using CPAP.  After discharge, has gotten better but still experiences shortness of breath walking short distances.  Also feels like with the pollen he is starting to cough more. FeNO 6 ppb.   Shortness of breath are likely related to heart failure, sleep apnea and also weight with physical deconditioning.  Rule out interstitial lung disease or airway disease such as asthma.  PFT, HRCT chest, RAST allergy panel ordered.  Start portable O2 at 2 L/min with activity.  Sleep consult.  08/08/2023: SHERLEAN with Rafe Mackowski NP Discussed the use of AI scribe software for clinical note transcription with the patient, who gave verbal consent to proceed. Jesse Morris is a 43 year old male presents with shortness of breath and breathing difficulties.  He had a PFT today that revealed a moderate restrictive defect with normal diffusion capacity, reduced ERV and positive bronchodilator response (24% change).  No formal obstruction. He experiences shortness of breath that worsens with seasonal changes and exposure to pollen.  He also gets short winded just sitting at rest sometimes and feels like he has to take a big deep breath.  Symptoms overall are stable compared to his last visit.  His cough has improved some.  He experiences wheezing and chest tightness.  He has used inhalers in the past but does not recall that he is being very effective.  Thinks he tried Breo and Symbicort.  Did not do these for very long periods of time.  No fevers, chills, hemoptysis or increased lower extremity swelling.  Does have issues with orthopnea and PND. He has a history of sleep apnea, diagnosed during the COVID pandemic through a home study. He often feels like he is not getting enough air with his CPAP machine  and and therefore has to remove it.  He is not currently using it.  Does feel tired all the time.  Snores loudly.  Has been told he stops breathing.  He does not drive and denies issues with morning headaches, sleepwalking. Not using sleep medications. He consumes caffeine in the mornings.  He is currently on Wegovy for weight management. He had to stop working due to breathing difficulties. He uses a portable oxygen machine and  reports that his oxygen levels drop when he is active but seem to do okay on the oxygen; although, he does not routinely use this. He did not have his oxygen with him today. Goes to sleep around 3 am. Takes a few hours to fall asleep. Wakes multiple times a night. Wake time varies. He has had a sleep study before but had difficulties tolerating CPAP. Felt like he couldn't breath on it. Never had an in lab study. Drinks 2 beers a day. He's not currently working.   12/11/2023: OV with Shanayah Kaffenberger NP Vue Pavon is a 43 year old male with sleep apnea and congestive heart failure who presents for follow-up on BiPAP therapy and pulmonary hypertension. He has been using BiPAP therapy since a sleep study in June revealed severe OSA. He uses the device about 70% of nights and achieves over four hours of use only 53% of the time. He experiences discomfort, describing a sensation of pressure building up behind his eyes, which sometimes leads him to remove the device during sleep. Despite these issues, he notes feeling 'a little bit' better the next day when he uses it for longer periods. He has a history of congestive heart failure, diagnosed following an echocardiogram in 2024 and a right heart catheterization in 2023. He was found to have borderline pulmonary hypertension. He is under the care of a cardiologist who manages his fluid intake and medications. No leg swelling, palpitations, CP, orthopnea with PAP, PND.  He reports chronic dyspnea, which is stable from baseline. He has tried using an inhaler previously prescribed, but it did not alleviate his symptoms. He has also used albuterol , which did not provide relief and is now finished. No cough, wheezing, hemoptysis, fevers, chest congestion.  Regarding weight management, he was on [facility] for two months but had to stop due to communication issues with his doctor. He recently renewed his prescription but was informed that Medicaid no longer covers it for weight  management. He has been denied disability twice and has hired a clinical research associate to assist with the process. 11/09/2023-12/08/2023:  BiPAP 25/16, PS 2 21/30 days; 53% >4 hr; average use 5 hr 6 min Pressure IPAP 95th 19.4, EPAP 17.4 Leaks 50.6 AHI 3.1  01/29/2024: Today - follow up Discussed the use of AI scribe software for clinical note transcription with the patient, who gave verbal consent to proceed.  History of Present Illness Jesse Morris is a 43 year old male with obesity and sleep apnea who presents for follow-up on weight management and BiPAP usage.  He experienced nausea with the initial injection of his Zepbound, which resolved thereafter. He has lost nearly 10 pounds since starting, decreasing from 346 pounds to 339 pounds. Still on the 2.5 mg dose with last injection this past Sunday. Tolerating well. No side effects.   Regarding his sleep apnea, he uses his BiPAP machine more consistently with improvement from previous usage. He experiences occasional sensations of pressure in his head with the BiPAP. Not happening every night. Has improved since his BiPAP settings were lowered  and only occurring a few times a week. He denies any drowsy driving. Does feel better with the BiPAP than without it. Energy levels are improving. Mask feels like it fits well.     No Known Allergies   There is no immunization history on file for this patient.  Past Medical History:  Diagnosis Date   CHF (congestive heart failure) (HCC)    Hypertension    Obesity    Sleep apnea     Tobacco History: Social History   Tobacco Use  Smoking Status Former   Current packs/day: 0.00   Average packs/day: 0.3 packs/day for 15.0 years (3.8 ttl pk-yrs)   Types: Cigarettes   Start date: 2000   Quit date: 2015   Years since quitting: 10.8  Smokeless Tobacco Never   Counseling given: Not Answered   Outpatient Medications Prior to Visit  Medication Sig Dispense Refill   atorvastatin  (LIPITOR) 40 MG tablet  Take 1 tablet (40 mg total) by mouth daily. 90 tablet 1   carvedilol  (COREG ) 25 MG tablet Take 1 tablet (25 mg total) by mouth 2 (two) times daily with a meal. 180 tablet 1   furosemide  (LASIX ) 20 MG tablet Take 20 mg by mouth.     spironolactone  (ALDACTONE ) 25 MG tablet Take 1 tablet (25 mg total) by mouth daily. 90 tablet 3   tirzepatide (ZEPBOUND) 2.5 MG/0.5ML injection vial Inject 2.5 mg into the skin once a week.     Fluticasone-Umeclidin-Vilant (TRELEGY ELLIPTA ) 100-62.5-25 MCG/ACT AEPB Inhale 1 puff into the lungs daily. (Patient not taking: Reported on 01/29/2024) 60 each 5   losartan  (COZAAR ) 100 MG tablet Take 1 tablet (100 mg total) by mouth daily. (Patient not taking: Reported on 01/29/2024) 90 tablet 1   No facility-administered medications prior to visit.     Review of Systems: As above     Physical Exam:  BP (!) 144/102 Comment: Rechecked BP as initial reading was elevated.  Pulse 94   Temp 98.4 F (36.9 C)   Ht 6' (1.829 m) Comment: Per pt  Wt (!) 339 lb (153.8 kg)   SpO2 94% Comment: RA  BMI 45.98 kg/m   GEN: Pleasant, interactive, well-kempt; morbidly obese; in no acute distress HEENT:  Normocephalic and atraumatic. PERRLA. Sclera white. Nasal turbinates pink, moist and patent bilaterally. No rhinorrhea present. Oropharynx pink and moist, without exudate or edema. No lesions, ulcerations, or postnasal drip. Mallampati III NECK:  Supple w/ fair ROM. No JVD present. No lymphadenopathy.   CV: RRR, no m/r/g, no peripheral edema.  PULMONARY:  Unlabored, regular breathing. Clear bilaterally A&P w/o wheezes/rales/rhonchi. No accessory muscle use.  GI: BS present and normoactive. Soft, non-tender to palpation.  MSK: No erythema, warmth or tenderness. Cap refil <2 sec all extrem.  Neuro: A/Ox3. No focal deficits noted.   Skin: Warm, no lesions or rashe Psych: Normal affect and behavior. Judgement and thought content appropriate.     Lab Results:  CBC    Component  Value Date/Time   WBC 6.7 05/18/2023 0810   RBC 5.04 05/18/2023 0810   HGB 14.9 05/18/2023 0810   HCT 49.7 05/18/2023 0810   PLT 286 05/18/2023 0810   MCV 98.6 05/18/2023 0810   MCH 29.6 05/18/2023 0810   MCHC 30.0 05/18/2023 0810   RDW 15.9 (H) 05/18/2023 0810   LYMPHSABS 1.6 05/16/2023 0511   MONOABS 0.5 05/16/2023 0511   EOSABS 0.3 05/16/2023 0511   BASOSABS 0.0 05/16/2023 0511    BMET  Component Value Date/Time   NA 140 01/22/2024 1621   K 4.2 01/22/2024 1621   CL 96 01/22/2024 1621   CO2 29 01/22/2024 1621   GLUCOSE 114 (H) 01/22/2024 1621   GLUCOSE 100 (H) 05/18/2023 0423   BUN 11 01/22/2024 1621   CREATININE 1.16 01/22/2024 1621   CALCIUM  9.4 01/22/2024 1621   GFRNONAA >60 05/18/2023 0423   GFRAA >60 07/19/2016 1249    BNP    Component Value Date/Time   BNP 49.1 05/15/2023 2211     Imaging:  No results found.  Administration History     None          Latest Ref Rng & Units 08/08/2023    2:45 PM  PFT Results  FVC-Pre L 3.11   FVC-Predicted Pre % 55   FVC-Post L 3.42   FVC-Predicted Post % 61   Pre FEV1/FVC % % 74   Post FEV1/FCV % % 84   FEV1-Pre L 2.31   FEV1-Predicted Pre % 52   FEV1-Post L 2.88   DLCO uncorrected ml/min/mmHg 32.07   DLCO UNC% % 98   DLCO corrected ml/min/mmHg 32.07   DLCO COR %Predicted % 98   DLVA Predicted % 142   TLC L 5.18   TLC % Predicted % 70   RV % Predicted % 87     Lab Results  Component Value Date   NITRICOXIDE 6 06/12/2023        Assessment & Plan:   Severe obstructive sleep apnea Very severe OSA, on BiPAP. Good control. Improved compliance. Encouraged to continue increasing usage to nightly goal and minimum of 70% of the nights >4 hr. Improved tolerance with decreased pressures. Occasionally still feels they are too strong. Average IPAP 16. Will reduce IPAP max to 17 cmH2O. Advised to notify if issues persist. Will reassess at follow up. Reviewed risks of untreated severe OSA and correlation  to his daytime fatigue, dyspnea, and cardiovascular disease. Encouraged to continue working on increasing usage. Verbalized understanding. Understands proper care/use of device. Healthy weight loss encouraged. Safe driving practices reviewed.  Patient has severe sleep apnea related to obesity with BMI 46 , posing significant cardiovascular risks. Patient has failed traditional weight loss measures with diet and exercise for >/6 months. Patient will continue on Zepbound (tirzepatide) for weight management. Zepbound is the only pharmaceutical treatment approved for moderate-to-severe OSA in adults who are overweight (BMI >/27) or obese (BMI >/30). The patient will continue lifestyle modifications, including structured nutrition and physical activity as directed. No other GLP1 therapy will be used simultaneously at this time. The patient does not have any FDA labeled contraindications to this agent, including pregnancy, lactation, hx or family history of medullary thyroid cancer, or multiple endocrine neoplasia type II. Side effect profile has been reviewed with patient. Aware of red flag symptoms to notify of immediately or seek emergency care, including severe nausea/vomiting, inability to pass bowels or gas, severe abdominal pain/tenderness, jaundice.  Tolerating well. Will begin titrating up dose by 2.5 mg every 4 weeks, as tolerated. Reviewed side effect profile again today. Will reassess at follow up.  Patient Instructions  Increase use of BiPAP every night, minimum of 4-6 hours a night.  Change equipment as directed. Wash your tubing with warm soap and water daily, hang to dry. Wash humidifier portion weekly. Use bottled, distilled water and change daily Be aware of reduced alertness and do not drive or operate heavy machinery if experiencing this or drowsiness.  Exercise encouraged, as tolerated.  Healthy weight management discussed.  Avoid or decrease alcohol consumption and medications that make you  more sleepy, if possible. Notify if persistent daytime sleepiness occurs even with consistent use of PAP therapy.   Change CPAP supplies... Every month Mask cushions and/or nasal pillows CPAP machine filters Every 3 months Mask frame (not including the headgear) CPAP tubing Every 6 months Mask headgear Chin strap (if applicable) Humidifier water tub   Adjust settings to IPAP max 17, EPAP min 12, PS 2   We discussed how untreated sleep apnea puts an individual at risk for cardiac arrhthymias, pulm HTN, DM, stroke and increases their risk for daytime accidents.    Use caution when driving and pull over if you become sleepy.   Continue Zepbound and increase to 5 mg dose weekly for the next 4 weeks. You can start this injection on Sunday, 11/23 - inject one dose once a week for 4 weeks then increase to the 7.5 mg injection once weekly on 12/21, as long as you are still tolerating the medication well. After this and if you are doing well, we will increase you again with a goal of getting to 10-15 mg once weekly for maintenance  Work on diet measures and exercise - 150 min/week. 30 minutes of strength training 3-5 days a week  We reviewed emergent symptoms to notify of immediately or seek emergency care, including severe nausea/vomiting, inability to pass bowels or gas, severe abdominal pain/tenderness, jaundice, swelling of the face/tongue.  Call if you are having difficulties with any side effects so we can help manage these   Follow up in 8 weeks with Dr. Neda (new sleep pt 30 minute slot) to see how Zebound is working and check on BiPAP. If symptoms do not improve or worsen, please contact office for sooner follow up or seek emergency care.  Notify your provider if you are planning to have a procedure/surgery, as this medication will need to be stopped prior.       Obesity, Class III, BMI 40-49.9 (morbid obesity) (HCC) Starting BMI 46 prior to Zepbound.  See above     Advised  if symptoms do not improve or worsen, to please contact office for sooner follow up or seek emergency care.   I spent 35 minutes of dedicated to the care of this patient on the date of this encounter to include pre-visit review of records, face-to-face time with the patient discussing conditions above, post visit ordering of testing, clinical documentation with the electronic health record, making appropriate referrals as documented, and communicating necessary findings to members of the patients care team.  Comer LULLA Rouleau, NP 01/29/2024  Pt aware and understands NP's role.

## 2024-02-05 ENCOUNTER — Telehealth: Payer: Self-pay

## 2024-02-05 NOTE — Telephone Encounter (Signed)
 Your request has been approved Approved. ZEPBOUND  5MG /0.5ML Soln Auto-inj is approved from 02/05/2024 to 08/04/2024. Authorization Expiration05/26/2026

## 2024-02-05 NOTE — Telephone Encounter (Signed)
*  Pulm  Pharmacy Patient Advocate Encounter   Received notification from Fax that prior authorization for Zepbound  5mg  is required/requested.   Insurance verification completed.   The patient is insured through [facility] MEDICAID.   Per test claim: PA required; PA submitted to above mentioned insurance via Latent Key/confirmation #/EOC [id number] Status is pending

## 2024-02-17 ENCOUNTER — Other Ambulatory Visit: Payer: Self-pay | Admitting: Cardiology

## 2024-02-17 DIAGNOSIS — I110 Hypertensive heart disease with heart failure: Secondary | ICD-10-CM

## 2024-03-03 ENCOUNTER — Emergency Department (HOSPITAL_COMMUNITY)
Admission: EM | Admit: 2024-03-03 | Discharge: 2024-03-04 | Disposition: A | Discharge status: Home / Self Care | Attending: Emergency Medicine | Admitting: Emergency Medicine

## 2024-03-03 ENCOUNTER — Emergency Department (HOSPITAL_COMMUNITY)

## 2024-03-03 ENCOUNTER — Other Ambulatory Visit: Payer: Self-pay

## 2024-03-03 ENCOUNTER — Encounter (HOSPITAL_COMMUNITY): Payer: Self-pay | Admitting: Emergency Medicine

## 2024-03-03 DIAGNOSIS — N448 Other noninflammatory disorders of the testis: Secondary | ICD-10-CM | POA: Diagnosis not present

## 2024-03-03 DIAGNOSIS — Z79899 Other long term (current) drug therapy: Secondary | ICD-10-CM | POA: Diagnosis not present

## 2024-03-03 DIAGNOSIS — I5023 Acute on chronic systolic (congestive) heart failure: Secondary | ICD-10-CM | POA: Insufficient documentation

## 2024-03-03 DIAGNOSIS — R0602 Shortness of breath: Secondary | ICD-10-CM | POA: Diagnosis present

## 2024-03-03 LAB — COMPREHENSIVE METABOLIC PANEL WITH GFR
ALT: 31 U/L (ref 0–44)
AST: 33 U/L (ref 15–41)
Albumin: 3.8 g/dL (ref 3.5–5.0)
Alkaline Phosphatase: 48 U/L (ref 38–126)
Anion gap: 8 (ref 5–15)
BUN: 18 mg/dL (ref 6–20)
CO2: 32 mmol/L (ref 22–32)
Calcium: 9 mg/dL (ref 8.9–10.3)
Chloride: 100 mmol/L (ref 98–111)
Creatinine, Ser: 1.08 mg/dL (ref 0.61–1.24)
GFR, Estimated: >60 mL/min
Glucose, Bld: 107 mg/dL — ABNORMAL HIGH (ref 70–99)
Potassium: 3.9 mmol/L (ref 3.5–5.1)
Sodium: 139 mmol/L (ref 135–145)
Total Bilirubin: 0.3 mg/dL (ref 0.0–1.2)
Total Protein: 6.6 g/dL (ref 6.5–8.1)

## 2024-03-03 LAB — TROPONIN T, HIGH SENSITIVITY: Troponin T High Sensitivity: 47 ng/L — ABNORMAL HIGH (ref 0–19)

## 2024-03-03 LAB — URINALYSIS, ROUTINE W REFLEX MICROSCOPIC
Bilirubin Urine: NEGATIVE
Glucose, UA: NEGATIVE mg/dL
Hgb urine dipstick: NEGATIVE
Ketones, ur: NEGATIVE mg/dL
Leukocytes,Ua: NEGATIVE
Nitrite: NEGATIVE
Protein, ur: NEGATIVE mg/dL
Specific Gravity, Urine: 1.023 (ref 1.005–1.030)
pH: 5 (ref 5.0–8.0)

## 2024-03-03 LAB — CBC
HCT: 34.3 % — ABNORMAL LOW (ref 39.0–52.0)
Hemoglobin: 10.6 g/dL — ABNORMAL LOW (ref 13.0–17.0)
MCH: 29 pg (ref 26.0–34.0)
MCHC: 30.9 g/dL (ref 30.0–36.0)
MCV: 93.7 fL (ref 80.0–100.0)
Platelets: 290 K/uL (ref 150–400)
RBC: 3.66 MIL/uL — ABNORMAL LOW (ref 4.22–5.81)
RDW: 13.9 % (ref 11.5–15.5)
WBC: 5.1 K/uL (ref 4.0–10.5)
nRBC: 0 % (ref 0.0–0.2)

## 2024-03-03 LAB — PRO BRAIN NATRIURETIC PEPTIDE: Pro Brain Natriuretic Peptide: 89.5 pg/mL

## 2024-03-03 MED ORDER — FUROSEMIDE 10 MG/ML IJ SOLN
40.0000 mg | Freq: Once | INTRAMUSCULAR | Status: AC
  Administered 2024-03-03: 40 mg via INTRAVENOUS
  Filled 2024-03-03: qty 4

## 2024-03-03 MED ORDER — FUROSEMIDE 40 MG PO TABS: 40.0000 mg | ORAL_TABLET | Freq: Every day | ORAL | 0 refills | Status: DC

## 2024-03-03 MED ORDER — POTASSIUM CHLORIDE CRYS ER 20 MEQ PO TBCR: 20.0000 meq | EXTENDED_RELEASE_TABLET | Freq: Two times a day (BID) | ORAL | 0 refills | Status: DC

## 2024-03-03 MED ORDER — FUROSEMIDE 40 MG PO TABS: 40.0000 mg | ORAL_TABLET | Freq: Once | ORAL | Status: DC

## 2024-03-03 NOTE — ED Provider Notes (Signed)
 " [place] EMERGENCY DEPARTMENT AT [facility] Provider Note   CSN: [id number] Arrival date & time: 03/03/24  1506     Patient presents with: Dizziness, Testicle Pain, and Abdominal Pain   Jesse Morris is a 43 y.o. male who presents today with primary concern for a 1 month history of increasing shortness of breath, dizziness, as well as testicular edema.  He has noticed patient does have previous medical history of pulmonary hypertension, systolic heart failure, dilated cardiomyopathy.  Endorses he still does drink substantial amounts of alcohol, with 7-8 shots of liquor per day most days of the week.  Review of echocardiogram done and December 2024 does show reduced LVEF 45 to 50% with global hypokinesis and ventricular hypertrophy.  There were no acute valvular abnormalities noted during that exam.  There is aortic root dilation as well.  He states that he has occasional blood per rectum though has not had this recently, is post be following with GI but has not had an appointment as of yet.  Bleeding is mainly noted with passage of stool.  Unable this year patient was also diagnosed with asthma with PFT conducted in April of this year noting a prebronchodilator FEV1 of 2.31, postbronchodilator FEV1 of 2.88.   Dizziness Testicle Pain Associated symptoms include abdominal pain.  Abdominal Pain      Prior to Admission medications  Medication Sig Start Date End Date Taking? Authorizing Provider  furosemide  (LASIX ) 40 MG tablet Take 1 tablet (40 mg total) by mouth daily. 03/03/24 03/08/24 Yes Myriam Dorn BROCKS, PA  potassium chloride  SA (KLOR-CON  M) 20 MEQ tablet Take 1 tablet (20 mEq total) by mouth 2 (two) times daily. 03/03/24 03/08/24 Yes Myriam Dorn BROCKS, PA  atorvastatin  (LIPITOR) 40 MG tablet Take 1 tablet (40 mg total) by mouth daily. 11/22/23   Ladona Heinz, MD  carvedilol  (COREG ) 25 MG tablet Take 1 tablet (25 mg total) by mouth 2 (two) times daily with a meal.  11/22/23   Ladona Heinz, MD  Fluticasone-Umeclidin-Vilant (TRELEGY ELLIPTA ) 100-62.5-25 MCG/ACT AEPB Inhale 1 puff into the lungs daily. Patient not taking: Reported on 01/29/2024 08/08/23   Malachy Comer GAILS, NP  furosemide  (LASIX ) 20 MG tablet PLEASE SEE ATTACHED FOR DETAILED DIRECTIONS 02/18/24   Rana Lum CROME, NP  losartan  (COZAAR ) 100 MG tablet Take 1 tablet (100 mg total) by mouth daily. Patient not taking: Reported on 01/29/2024 09/19/23   Ladona Heinz, MD  spironolactone  (ALDACTONE ) 25 MG tablet Take 1 tablet (25 mg total) by mouth daily. 01/22/24 04/21/24  Rana Lum CROME, NP  tirzepatide  (ZEPBOUND ) 5 MG/0.5ML Pen Inject 5 mg into the skin once a week. 01/29/24   Cobb, Comer GAILS, NP  tirzepatide  (ZEPBOUND ) 7.5 MG/0.5ML Pen Inject 7.5 mg into the skin once a week. Start 12/21 after 4 weeks on 5 mg if tolerating well 02/24/24   Cobb, Comer GAILS, NP    Allergies: Patient has no known allergies.    Review of Systems  Gastrointestinal:  Positive for abdominal pain.  Genitourinary:  Positive for testicular pain.  Neurological:  Positive for dizziness.  All other systems reviewed and are negative.   Updated Vital Signs BP 117/76 (BP Location: Left Arm)   Pulse 78   Temp 98.8 F (37.1 C) (Oral)   Resp 19   SpO2 96%   Physical Exam Vitals and nursing note reviewed.  Constitutional:      General: He is not in acute distress.    Appearance: Normal appearance.  HENT:  Head: Normocephalic and atraumatic.     Mouth/Throat:     Mouth: Mucous membranes are moist.     Pharynx: Oropharynx is clear.  Eyes:     Extraocular Movements: Extraocular movements intact.     Conjunctiva/sclera: Conjunctivae normal.     Pupils: Pupils are equal, round, and reactive to light.  Cardiovascular:     Rate and Rhythm: Normal rate and regular rhythm.     Pulses: Normal pulses.     Heart sounds: Normal heart sounds, S1 normal and S2 normal. No murmur heard.    No friction rub. No gallop.   Pulmonary:     Effort: Pulmonary effort is normal.     Breath sounds: Normal breath sounds and air entry.  Abdominal:     General: Abdomen is flat. Bowel sounds are normal.     Palpations: Abdomen is soft.  Genitourinary:    Testes: Cremasteric reflex is present.        Right: Tenderness, testicular hydrocele or varicocele not present.        Left: Tenderness, testicular hydrocele or varicocele not present.     Comments: Bilateral testicular edema is appreciated Musculoskeletal:        General: Normal range of motion.     Cervical back: Normal range of motion and neck supple.     Right lower leg: No edema.     Left lower leg: No edema.  Skin:    General: Skin is warm and dry.     Capillary Refill: Capillary refill takes less than 2 seconds.  Neurological:     General: No focal deficit present.     Mental Status: He is alert and oriented to person, place, and time. Mental status is at baseline.     GCS: GCS eye subscore is 4. GCS verbal subscore is 5. GCS motor subscore is 6.  Psychiatric:        Mood and Affect: Mood normal.     (all labs ordered are listed, but only abnormal results are displayed) Labs Reviewed  COMPREHENSIVE METABOLIC PANEL WITH GFR - Abnormal; Notable for the following components:      Result Value   Glucose, Bld 107 (*)    All other components within normal limits  CBC - Abnormal; Notable for the following components:   RBC 3.66 (*)    Hemoglobin 10.6 (*)    HCT 34.3 (*)    All other components within normal limits  TROPONIN T, HIGH SENSITIVITY - Abnormal; Notable for the following components:   Troponin T High Sensitivity 47 (*)    All other components within normal limits  URINALYSIS, ROUTINE W REFLEX MICROSCOPIC  PRO BRAIN NATRIURETIC PEPTIDE  CBG MONITORING, ED    EKG: EKG Interpretation Date/Time:  Tuesday March 03 2024 15:17:38 EST Ventricular Rate:  81 PR Interval:  159 QRS Duration:  96 QT Interval:  404 QTC Calculation: 469 R  Axis:   39  Text Interpretation: Sinus rhythm Nonspecific T abnormalities, lateral leads since last tracing no significant change Confirmed by Lenor Hollering [phone]) on 03/03/2024 9:34:37 PM  Radiology: [facility] Chest 2 View Result Date: 03/03/2024 CLINICAL DATA:  Dyspnea.  History of CHF. EXAM: CHEST - 2 VIEW COMPARISON:  Radiograph 05/15/2023, CT 08/13/2023 FINDINGS: The heart is upper normal in size.The cardiomediastinal contours are normal. The lungs are clear. Pulmonary vasculature is normal. No consolidation, pleural effusion, or pneumothorax. No acute osseous abnormalities are seen. IMPRESSION: No active cardiopulmonary disease. Electronically Signed   By: Hollering  Sanford M.D.   On: 03/03/2024 21:38     Procedures   Medications Ordered in the ED  furosemide  (LASIX ) injection 40 mg (40 mg Intravenous Given 03/03/24 2116)                                    Medical Decision Making Amount and/or Complexity of Data Reviewed Labs: ordered. Radiology: ordered.  Risk Prescription drug management.   Medical Decision Making:   Jesse Morris is a 43 y.o. male who presented to the ED today with acute worsening shortness of breath, testicular edema detailed above.    External chart has been reviewed including outpatient records. Patient placed on continuous vitals and telemetry monitoring while in ED which was reviewed periodically.  Complete initial physical exam performed, notably the patient  was alert and oriented no apparent distress.  There is noted testicular edema bilaterally, no peripheral edema is appreciated..    Reviewed and confirmed nursing documentation for past medical history, family history, social history.    Initial Assessment:   With the patient's presentation of worsening shortness of breath and testicular edema, most likely diagnosis is CHF exacerbation.  Also consider possible CKD, infectious pathology of the testes/urologic etiology however given nontender  presentation as well as no other focal findings suggestive of same, I find this less unlikely.  Initial Plan:   Screening labs including CBC and Metabolic panel to evaluate for infectious or metabolic etiology of disease.  CXR to evaluate for structural/infectious intrathoracic pathology.  EKG/Troponin testing/BNP testing to evaluate for cardiac pathology. Place patient on his baseline 3 L by nasal cannula and ambulate patient to evaluate his oxygenation status. Objective evaluation as below reviewed   Initial Study Results:   Laboratory  All laboratory results reviewed without evidence of clinically relevant pathology.   Exceptions include: None  EKG EKG was reviewed independently. Rate, rhythm, axis, intervals all examined and without medically relevant abnormality. ST segments without concerns for elevations.    Radiology:  All images reviewed independently. Agree with radiology report at this time.   DG Chest 2 View Result Date: 03/03/2024 CLINICAL DATA:  Dyspnea.  History of CHF. EXAM: CHEST - 2 VIEW COMPARISON:  Radiograph 05/15/2023, CT 08/13/2023 FINDINGS: The heart is upper normal in size.The cardiomediastinal contours are normal. The lungs are clear. Pulmonary vasculature is normal. No consolidation, pleural effusion, or pneumothorax. No acute osseous abnormalities are seen. IMPRESSION: No active cardiopulmonary disease. Electronically Signed   By: Andrea Gasman M.D.   On: 03/03/2024 21:38   Reassessment and Plan:   Reevaluation patient states that he is subjectively better and has had increased urinary output since administration of furosemide .  He consultation with Dr. Lenor, have ordered troponin in addition to his BNP to evaluate for ACS secondary to his complaints of chest discomfort.  Initial troponin is mildly elevated at 47.  Plan at this time is to draw repeat troponin at a 2-hour interval and if no change at that time discharged with increased dosing of furosemide  over  the next 5 days along with potassium to prevent iatrogenic hypokalemia.  Otherwise have him follow-up with cardiology and primary care.  Disposition currently pending results of repeat troponin.  Care signed off to L. Jarold, PA-C       Final diagnoses:  Acute on chronic systolic congestive heart failure [facility])    ED Discharge Orders  Ordered    furosemide  (LASIX ) 40 MG tablet  Daily        03/03/24 2235    potassium chloride  SA (KLOR-CON  M) 20 MEQ tablet  2 times daily        03/03/24 2235               Myriam Dorn BROCKS, [place] 03/03/24 2240    Lenor Hollering, MD 03/03/24 2313  "

## 2024-03-03 NOTE — ED Triage Notes (Signed)
 Pt arrives w/ [facility] from home w/ c/o on and off dizziness. Reports heaviness on bilater shoulders and arms. Also c/o left testicle pain x 1 month. Abd pain with the testicle pain. Wears o2 as needed.

## 2024-03-04 LAB — TROPONIN T, HIGH SENSITIVITY: Troponin T High Sensitivity: 45 ng/L — ABNORMAL HIGH (ref 0–19)

## 2024-03-04 NOTE — ED Provider Notes (Signed)
" °  Repeat trop flat/down-trending at 45.  Patient remains HD stable, on baseline O2.  Will plan for discharge as per primary team.  Encouraged close PCP follow-up.  Return here for new concerns.   Jarold Olam HERO, PA-C 03/04/24 0123  "

## 2024-03-25 ENCOUNTER — Encounter: Payer: Self-pay | Admitting: Pulmonary Disease

## 2024-03-25 ENCOUNTER — Ambulatory Visit: Admitting: Pulmonary Disease

## 2024-03-25 DIAGNOSIS — J4530 Mild persistent asthma, uncomplicated: Secondary | ICD-10-CM

## 2024-03-25 DIAGNOSIS — Z87891 Personal history of nicotine dependence: Secondary | ICD-10-CM | POA: Diagnosis not present

## 2024-03-25 DIAGNOSIS — G4733 Obstructive sleep apnea (adult) (pediatric): Secondary | ICD-10-CM | POA: Diagnosis not present

## 2024-03-25 DIAGNOSIS — J984 Other disorders of lung: Secondary | ICD-10-CM

## 2024-03-25 MED ORDER — TRELEGY ELLIPTA 100-62.5-25 MCG/ACT IN AEPB: 1.0000 | INHALATION_SPRAY | Freq: Every day | RESPIRATORY_TRACT | 5 refills | Status: AC

## 2024-03-25 MED ORDER — ZEPBOUND 10 MG/0.5ML ~~LOC~~ SOAJ: 10.0000 mg | SUBCUTANEOUS | 0 refills | Status: AC

## 2024-03-25 NOTE — Patient Instructions (Addendum)
 Continue using a BiPAP on a nightly basis  Prescription placed for your increased dose of Zepbound  - Dose to be increased to 10 mg subcu every week  Trelegy refill sent in  Follow-up in 2 months

## 2024-03-25 NOTE — Progress Notes (Signed)
 "              Jesse Morris    [id number]    [birth year]  Primary Care Physician:Vanstory, Rosina SAILOR, PA  Referring Physician: Rosalea Rosina SAILOR, PA [address] [place],  [place] [address]  Chief complaint:   Patient with severe obstructive sleep apnea Obesity, cardiomyopathy  HPI:  Has been doing relatively well He is losing some weight with use of Mounjaro  Down about 10 pounds  He has severe obstructive sleep apnea on BiPAP BiPAP pressure was reduced to 17/12, seems to be tolerating this better  Reformed smoker, chronic cough, chronic respiratory failure  History of heart failure with reduced ejection fraction nonischemic cardiomyopathy, coronary artery disease, hypertension, pulmonary hypertension  Still gets short of breath with activity but tries to stay active  No chest pains, no chest discomfort shortness of breath with activity  Outpatient Encounter Medications as of 03/25/2024  Medication Sig   atorvastatin  (LIPITOR) 40 MG tablet Take 1 tablet (40 mg total) by mouth daily.   carvedilol  (COREG ) 25 MG tablet Take 1 tablet (25 mg total) by mouth 2 (two) times daily with a meal.   furosemide  (LASIX ) 20 MG tablet PLEASE SEE ATTACHED FOR DETAILED DIRECTIONS   furosemide  (LASIX ) 40 MG tablet Take 1 tablet (40 mg total) by mouth daily.   losartan  (COZAAR ) 100 MG tablet Take 1 tablet (100 mg total) by mouth daily.   ZEPBOUND  10 MG/0.5ML Pen Inject 10 mg into the skin once a week for 28 days.   Fluticasone-Umeclidin-Vilant (TRELEGY ELLIPTA ) 100-62.5-25 MCG/ACT AEPB Inhale 1 puff into the lungs daily. (Patient not taking: Reported on 03/25/2024)   potassium chloride  SA (KLOR-CON  M) 20 MEQ tablet Take 1 tablet (20 mEq total) by mouth 2 (two) times daily. (Patient not taking: Reported on 03/25/2024)   spironolactone  (ALDACTONE ) 25 MG tablet Take 1 tablet (25 mg total) by mouth daily. (Patient not taking: Reported on 03/25/2024)   [DISCONTINUED] tirzepatide  (ZEPBOUND ) 5 MG/0.5ML Pen  Inject 5 mg into the skin once a week. (Patient not taking: Reported on 03/25/2024)   [DISCONTINUED] tirzepatide  (ZEPBOUND ) 7.5 MG/0.5ML Pen Inject 7.5 mg into the skin once a week. Start 12/21 after 4 weeks on 5 mg if tolerating well (Patient not taking: Reported on 03/25/2024)   No facility-administered encounter medications on file as of 03/25/2024.    Allergies as of 03/25/2024   (No Known Allergies)    Past Medical History:  Diagnosis Date   CHF (congestive heart failure) (HCC)    Hypertension    Obesity    Sleep apnea     Past Surgical History:  Procedure Laterality Date   HAND TENDON SURGERY     RIGHT/LEFT HEART CATH AND CORONARY ANGIOGRAPHY N/A 04/17/2021   Procedure: RIGHT/LEFT HEART CATH AND CORONARY ANGIOGRAPHY;  Surgeon: Rolan Ezra RAMAN, MD;  Location: [facility] INVASIVE CV LAB;  Service: Cardiovascular;  Laterality: N/A;    Family History  Problem Relation Age of Onset   Asthma Maternal Aunt     Social History   Socioeconomic History   Marital status: Single    Spouse name: Not on file   Number of children: 2   Years of education: Not on file   Highest education level: Not on file  Occupational History   Not on file  Tobacco Use   Smoking status: Former    Current packs/day: 0.00    Average packs/day: 0.3 packs/day for 15.0 years (3.8 ttl pk-yrs)    Types: Cigarettes  Start date: 2000    Quit date: 2015    Years since quitting: 11.0   Smokeless tobacco: Never  Vaping Use   Vaping status: Never Used  Substance and Sexual Activity   Alcohol use: Yes    Alcohol/week: 14.0 standard drinks of alcohol    Types: 14 Shots of liquor per week    Comment: states he drinks 2 shots per night   Drug use: Not Currently    Types: Marijuana    Comment: quit   Sexual activity: Not on file  Other Topics Concern   Not on file  Social History Narrative   Not on file   Social Drivers of Health   Tobacco Use: Medium Risk (03/25/2024)   Patient History    Smoking  Tobacco Use: Former    Smokeless Tobacco Use: Never    Passive Exposure: Not on file  Financial Resource Strain: Medium Risk (05/31/2023)   Overall Financial Resource Strain (CARDIA)    Difficulty of Paying Living Expenses: Somewhat hard  Food Insecurity: Food Insecurity Present (05/31/2023)   Hunger Vital Sign    Worried About Running Out of Food in the Last Year: Sometimes true    Ran Out of Food in the Last Year: Never true  Transportation Needs: Unmet Transportation Needs (05/31/2023)   PRAPARE - Transportation    Lack of Transportation (Medical): No    Lack of Transportation (Non-Medical): Yes  Physical Activity: Not on file  Stress: Stress Concern Present (05/31/2023)   [employer] of Occupational Health - Occupational Stress Questionnaire    Feeling of Stress : Rather much  Social Connections: Not on file  Intimate Partner Violence: Not At Risk (05/16/2023)   Humiliation, Afraid, Rape, and Kick questionnaire    Fear of Current or Ex-Partner: No    Emotionally Abused: No    Physically Abused: No    Sexually Abused: No  Depression (PHQ2-9): Not on file  Alcohol Screen: Not on file  Housing: High Risk (05/31/2023)   Housing Stability Vital Sign    Unable to Pay for Housing in the Last Year: Yes    Number of Times Moved in the Last Year: 1    Homeless in the Last Year: No  Utilities: Not At Risk (05/31/2023)   [employer] Utilities    Threatened with loss of utilities: No  Recent Concern: Utilities - At Risk (05/16/2023)   [employer] Utilities    Threatened with loss of utilities: Yes  Health Literacy: Adequate Health Literacy (05/31/2023)   B1300 Health Literacy    Frequency of need for help with medical instructions: Never    Review of Systems  Respiratory:  Positive for apnea and shortness of breath.   Psychiatric/Behavioral:  Positive for sleep disturbance.     Vitals:   03/25/24 1429  BP: 102/66  Pulse: 84  Temp: 98.1 F (36.7 C)  SpO2: 97%     Physical  Exam Constitutional:      Appearance: He is obese.  HENT:     Head: Normocephalic.     Mouth/Throat:     Mouth: Mucous membranes are moist.  Eyes:     General: No scleral icterus.    Pupils: Pupils are equal, round, and reactive to light.  Cardiovascular:     Rate and Rhythm: Normal rate and regular rhythm.     Pulses: Normal pulses.  Pulmonary:     Effort: Pulmonary effort is normal. No respiratory distress.     Breath sounds: No stridor. No wheezing or  rhonchi.  Musculoskeletal:     Cervical back: Normal range of motion and neck supple.  Neurological:     Mental Status: He is alert.  Psychiatric:        Mood and Affect: Mood normal.    Data Reviewed: Download from his BiPAP machine shows 47% compliance Maximum IPAP of 17 maximum minimum EPAP of 12  Residual AHI of 1.7  Assessment/Plan: Severe obstructive sleep apnea - Continue BiPAP  On tirzepatide  Escalating doses 2.5 mg every 4 weeks - Order for 10 mg placed - Not having significant side effects at present he initially did have some nausea but this is better  Follow-up in 2 months  From the last documentation in his records he was on 7.5 mg from 12/21 - Will start 10 mg on 04/01/24  86-month follow-up  Advised to give us  a call if any significant concerns  I spent 30 minutes dedicated to the care of this patient on the date of this encounter to include previsit review of records, face-to-face time with the patient discussing conditions above, post visit ordering of testing,ordering medications,independentlyinterpreting results, clinical documentation with electronic health record     Jennet Epley MD [facility] 03/25/2024, 2:51 PM  CC: Rosalea Rosina SAILOR, PA   "

## 2024-04-01 ENCOUNTER — Telehealth: Payer: Self-pay

## 2024-04-01 ENCOUNTER — Other Ambulatory Visit (HOSPITAL_COMMUNITY): Payer: Self-pay

## 2024-04-01 NOTE — Telephone Encounter (Signed)
*  Pulm  Pharmacy Patient Advocate Encounter   Received notification from Fax that prior authorization for Trelegy Ellipta  is required/requested.   Insurance verification completed.   The patient is insured through [employer].   Medicaid covers individual LAMA inhalers, such as: Spiriva and Incruse, that can be trialed in addition to the preferred ICS/LABA agents (Advair, Symbicort, and Dulera ).   After 1-3 month trial of 2 preferred ICS/LABA agents, plan will likely cover combination triple therapy inhaler.    Key: [id number]

## 2024-04-03 ENCOUNTER — Ambulatory Visit: Attending: Emergency Medicine | Admitting: Emergency Medicine

## 2024-04-03 ENCOUNTER — Encounter: Payer: Self-pay | Admitting: Emergency Medicine

## 2024-04-03 VITALS — BP 125/82 | HR 77 | Resp 17 | Ht 72.0 in | Wt 336.0 lb

## 2024-04-03 DIAGNOSIS — I2510 Atherosclerotic heart disease of native coronary artery without angina pectoris: Secondary | ICD-10-CM | POA: Insufficient documentation

## 2024-04-03 DIAGNOSIS — G4733 Obstructive sleep apnea (adult) (pediatric): Secondary | ICD-10-CM | POA: Diagnosis present

## 2024-04-03 DIAGNOSIS — I110 Hypertensive heart disease with heart failure: Secondary | ICD-10-CM | POA: Diagnosis present

## 2024-04-03 DIAGNOSIS — I340 Nonrheumatic mitral (valve) insufficiency: Secondary | ICD-10-CM | POA: Insufficient documentation

## 2024-04-03 DIAGNOSIS — I428 Other cardiomyopathies: Secondary | ICD-10-CM | POA: Insufficient documentation

## 2024-04-03 DIAGNOSIS — I10 Essential (primary) hypertension: Secondary | ICD-10-CM | POA: Diagnosis present

## 2024-04-03 DIAGNOSIS — E7800 Pure hypercholesterolemia, unspecified: Secondary | ICD-10-CM | POA: Insufficient documentation

## 2024-04-03 MED ORDER — FUROSEMIDE 20 MG PO TABS: 40.0000 mg | ORAL_TABLET | Freq: Every morning | ORAL | 3 refills | Status: AC

## 2024-04-03 MED ORDER — LOSARTAN POTASSIUM 100 MG PO TABS: 100.0000 mg | ORAL_TABLET | Freq: Every evening | ORAL | 3 refills | Status: AC

## 2024-04-03 MED ORDER — CARVEDILOL 25 MG PO TABS: ORAL_TABLET | ORAL | 3 refills | Status: AC

## 2024-04-03 MED ORDER — ATORVASTATIN CALCIUM 40 MG PO TABS: 40.0000 mg | ORAL_TABLET | Freq: Every morning | ORAL | 3 refills | Status: AC

## 2024-04-03 MED ORDER — SPIRONOLACTONE 25 MG PO TABS: 12.5000 mg | ORAL_TABLET | Freq: Every morning | ORAL | 3 refills | Status: AC

## 2024-04-03 NOTE — Progress Notes (Signed)
 " Cardiology Office Note:    Date:  04/03/2024  ID:  Jesse Morris, DOB 07/13/1980, MRN [id number] PCP: Rosalea Rosina SAILOR, PA  [facility] Providers Cardiologist:  Gordy Bergamo, MD Cardiology APP:  Rana Lum CROME, NP       Patient Profile:       Chief Complaint: 59-month follow-up History of Present Illness:  Jesse Morris is a 44 y.o. male with visit-pertinent history of HFmrEF, nonischemic cardiomyopathy, mitral regurgitation, hypertension, morbid obesity, hyperlipidemia, tobacco abuse, obstructive sleep apnea, coronary artery disease, mild aortic dilation   Initially established with cardiology during admission 04/2021 during admission for dyspnea and hypoxia. O2 sats upon arrival 88%. History of smoking cigarettes and THC. 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 Had recent Influenza A infection. TTE during admission with EF 45-50%, mild LVE, moderate LVH, normal diastolic parameters and RV and no signs of pulmonary HTN. No obvious shunt but bubble study not done. CTA with no PE. Prior diagnosis of sleep apnea but not on CPAP. Troponins were elevated.  Underwent right and left heart cath on 04/17/2021 which revealed minimal CAD with 15% stenosis in mid to distal LAD, mildly elevated LVEDP, high cardiac output, borderline pulm HTN. GDMT for CHF limited by financial concerns. Was discharged with CPAP.    Admission 10/21/2021 for SOB and chest pain. Hypertensive urgency with BP 198/147, lost his job and was not taking his medications. Seen by Dr. Levern during admission. Troponins elevated but flat. He was not compliant with CPAP.    Seen by Dr. Bergamo in clinic 08/10/22 to establish cardiology care. Nonischemic cardiomyopathy felt to be secondary to uncontrolled hypertension. Metoprolol  was changed to carvedilol  and he was advised to discontinue albuterol  and Breo Ellipta prescribed for dyspnea felt to be related to CHF rather than COPD or emphysema. Volume  status was stable. He was encouraged to work on lifestyle modification and BP control and return in 6-8 weeks for follow-up.    ED visit 02/2023 for worsening shortness of breath and chest pain. BNP elevated and he was admitted for IV diuresis. Troponins consistently elevated, echo revealed LVEF 45-50%, no significant changes from prior echo. He left AMA.    Admission 05/16/2023, seen by cardiology for chest pain and shortness of breath. He reported orthopnea and and exertional SOB. Chest pain described as sharp, 8/10 and radiating down left arm SpO2 in the 80s, BP 228/122. EKG showed sinus rhythm, left atrial enlargement, rate of 90. CXR showed mild cardiomegaly, normal pulmonary vasculature. Troponin 128 >> 127. Reported he was working at [employer]. Had CPAP but it was in storage. LDL was 136 and atorvastatin  was resumed. Troponin elevation felt to be 2/2 volume overload and hypertensive urgency. He was discharged on furosemide  20 mg daily, losartan  50 mg daily, and carvedilol  6.25 mg twice daily. Consider Entresto .    Seen in clinic on 11/22/2023 by Dr. Bergamo.  Experiences difficult managing medications due to cost.  This is led to irregular intake of atorvastatin , losartan , and carvedilol .  He had obtained the [facility] prescription card and he is medications were refilled.  He was to follow-up in 2 months.  Last seen in clinic on 01/22/2024.  He was euvolemic and well compensated on exam with unchanged chronic dyspnea.  His blood pressure was elevated at 140/100.  He was started on spironolactone  25 mg daily.   Discussed the use of AI scribe software for clinical note transcription with the patient, who gave verbal  consent to proceed.  History of Present Illness Jesse Morris is a 44 year old male with congestive heart failure who presents with medication management issues.  He has ongoing difficulty obtaining and organizing his heart failure medications. He stopped losartan  due to pharmacy  unavailability and has not started spironolactone  because the prescription expired. He is taking atorvastatin  20 mg instead of the prescribed 40 mg due to a pharmacy error, carvedilol  twice daily, and furosemide  once daily but is unsure of the dose.  He has yet to start on Zepbound .  He is trying to stay active but has limited exercise tolerance due to to his shortness of breath.  He denies any exertional chest pains.  No changes currently to his chronic shortness of breath.  He has mild unchanged orthopnea but denies any PND or lower extremity edema.  He has difficulty sleeping, which he links in part to discomfort and sharp pain when sleep-deprived.  He uses his BiPAP machine nightly.  He has recently moved and lives on the third floor which does increase his daily exertion.  He is waiting on a decision on his disability claim after 2 denials.  He denies syncope, presyncope, lightheadedness, dizziness  Review of systems:  Please see the history of present illness. All other systems are reviewed and otherwise negative.      Studies Reviewed:        Echocardiogram 03/02/2023 1. Technically difficult study with limited views. Left ventricular  ejection fraction, by estimation, is 45 to 50%. The left ventricle has  mildly decreased function. The left ventricle demonstrates global  hypokinesis. There is mild left ventricular  hypertrophy. Left ventricular diastolic parameters are indeterminate.   2. Right ventricular systolic function was not well visualized. The right  ventricular size is not well visualized. Tricuspid regurgitation signal is  inadequate for assessing PA pressure.   3. The mitral valve is grossly normal. Mild mitral valve regurgitation.   4. The aortic valve was not well visualized. Aortic valve regurgitation  is not visualized. No aortic stenosis is present.   5. Aortic dilatation noted. There is dilatation of the aortic root,  measuring 42 mm. There is dilatation of the  ascending aorta, measuring 39  mm.    Echocardiogram 10/21/2021  1. Left ventricular ejection fraction, by estimation, is 45 to 50%. The  left ventricle has mildly decreased function. The left ventricle  demonstrates global hypokinesis. There is mild concentric left ventricular  hypertrophy. Left ventricular diastolic  parameters are consistent with Grade I diastolic dysfunction (impaired  relaxation).   2. Right ventricular systolic function is normal. The right ventricular  size is normal.   3. The mitral valve is normal in structure. No evidence of mitral valve  regurgitation.   4. The aortic valve is normal in structure. Aortic valve regurgitation is  not visualized.   5. Aortic dilatation noted. There is borderline dilatation of the  ascending aorta.   6. The inferior vena cava is normal in size with <50% respiratory  variability, suggesting right atrial pressure of 8 mmHg.    Cardiac catheterization 04/17/2021   Mid LAD to Dist LAD lesion is 15% stenosed.   1. Normal PCWP and RA pressure, mildly elevated LVEDP.  2. High cardiac output.  3. Borderline pulmonary hypertension.  4. Minimal CAD   Nonischemic cardiomyopathy.  Needs workup for sleep apnea.  Diagnostic Dominance: Left   Risk Assessment/Calculations:              Physical Exam:  VS:  BP 125/82 (BP Location: Left Arm, Patient Position: Sitting, Cuff Size: Large)   Pulse 77   Resp 17   Ht 6' (1.829 m)   Wt (!) 336 lb (152.4 kg)   SpO2 94%   BMI 45.57 kg/m    Wt Readings from Last 3 Encounters:  04/03/24 (!) 336 lb (152.4 kg)  03/25/24 (!) 337 lb 12.8 oz (153.2 kg)  01/29/24 (!) 339 lb (153.8 kg)    GEN: Well nourished, well developed in no acute distress NECK: No JVD; No carotid bruits CARDIAC: RRR, no murmurs, rubs, gallops RESPIRATORY:  Clear to auscultation without rales, wheezing or rhonchi  ABDOMEN: Soft, non-tender, non-distended EXTREMITIES:  No edema; No acute deformity      Assessment  and Plan:  HFmrEF Nonischemic cardiomyopathy Hypertensive heart disease Echocardiogram 02/2023 with LVEF 45 to 50%, global hypokinesis, mild LVH R/LHC 04/2021 with nonobstructive disease Cardiomyopathy thought to be due to uncontrolled hypertension History of exertional hypoxia requiring 2L supplemental O2 as needed - Today he is euvolemic on exam and well compensated - He has chronic dyspnea which is unchanged on supplemental O2.  He has mild orthopnea which is improved on BiPAP.  He denies PND, LEE, or weight gain - He continues to struggle with obtaining and organizing heart failure medications.  Currently not taking losartan  nor spironolactone .  Has yet to start on Zepbound  - We had long discussion regarding his current medication regimen.  Refills were sent to his pharmacy and he was given detailed instructions of when to take each medication - Restart spironolactone  12.5 mg daily in the a.m. - Restart losartan  100 mg daily in the p.m. - Continue carvedilol  25 mg twice daily - Continue furosemide  20 mg daily in the a.m. - Start Zepbound  - BMET x 2 weeks to closely monitor kidney function and electrolyte status   Mild coronary artery disease Hyperlipidemia, LDL goal <70 [facility] 04/2021 showed mid LAD to distal LAD 15% stenosis LDL 136 on 05/2023 - Today he is without symptoms suggestive of active angina and denies exertional chest discomfort.  No indication for ischemic evaluation at this time - Repeat fasting lipid panel and LFTs - Continue atorvastatin  40 mg daily   Hypertension Blood pressure today is controlled at 125/82 - Continue HF GDMT    Severe obstructive sleep apnea - Adherent and managed with BiPAP   Mild mitral valve regurgitation Echocardiogram 02/2023 showed mild mitral valve regurgitation - No interventions warranted at this time   Chronic respiratory failure with hypoxia HRCT without ILD - On supplemental 2L O2 for exertion - Today he is stable on room air -  Management per pulmonology   Morbid obesity BMI 45.57 - Managed with Zepbound  - Discussed heart healthy dieting and daily physical exercise      Dispo:  Return in about 3 months (around 07/02/2024).  Signed, Lum LITTIE Louis, NP  "

## 2024-04-03 NOTE — Patient Instructions (Addendum)
 Medication Instructions:  START TAKING SPIRONOLACTONE  12.5 MG (1/2 TABLET) IN THE MORNING. TAKE LOSARTAN  100 MG IN THE EVENING. TAKE ATORVASTATIN  IN THE MORNING. TAKE CARVEDILOL  25 MG IN THE MORNING AND 25 MG AT NIGHT. TAKE LASIX  40 MG IN THE MORNING.  Lab Work: FASTING LIPID PANEL AND BMET TO BE DONE IN 2 WEEKS. (04-17-24)  Testing/Procedures: NONE  Follow-Up: At [facility], you and your health needs are our priority.  As part of our continuing mission to provide you with exceptional heart care, our providers are all part of one team.  This team includes your primary Cardiologist (physician) and Advanced Practice Providers or APPs (Physician Assistants and Nurse Practitioners) who all work together to provide you with the care you need, when you need it.  Your next appointment:   3 MONTHS  Provider:   Gordy Bergamo, MD     Other Instructions:

## 2024-04-09 ENCOUNTER — Ambulatory Visit: Admitting: Cardiology

## 2024-04-10 NOTE — Telephone Encounter (Signed)
 Copied from CRM #[id number]. Topic: Clinical - Medication Prior Auth >> Apr 06, 2024 11:01 AM Jesse Morris wrote: Reason for CRM: Patient is calling to request that provider make a decision regarding patient's Trelegy today. >> Apr 06, 2024 11:37 AM Jesse Morris wrote: Patient states he contacted his insurance and was advised that Dr. Neda needs to approve the medication. Patient is kindly requesting this to be completed as soon as possible.   -------------------------------------------------------------------------------------------------------------------------------------------- Pharmacy Patient Advocate Encounter   Received notification from Fax that prior authorization for Trelegy Ellipta  is required/requested.   Insurance verification completed.   The patient is insured through [employer].   Medicaid covers individual LAMA inhalers, such as: Spiriva and Incruse, that can be trialed in addition to the preferred ICS/LABA agents (Advair, Symbicort, and Dulera ).    After 1-3 month trial of 2 preferred ICS/LABA agents, plan will likely cover combination triple therapy inhaler.     I called the patient and made him aware that he would need to try one of the above inhaler prior to his Insurance covering the Trelegy.  I advised him that I was sending Dr. Neda a message so he can choose another inhaler for him to send to his pharmacy and once we hear back from Dr. Neda we will call him back and let him know.  He verbalized understanding.  Dr. Neda, Please see information from Pharmacy (PA team) regarding Trelegy.  Thank you.

## 2024-04-16 ENCOUNTER — Telehealth (HOSPITAL_BASED_OUTPATIENT_CLINIC_OR_DEPARTMENT_OTHER): Payer: Self-pay | Admitting: *Deleted

## 2024-04-16 NOTE — Telephone Encounter (Signed)
"  ° °  Pre-operative Risk Assessment    Patient Name: Jesse Morris  DOB: 06/14/1980 MRN: [id number]   Date of last office visit: 04/03/2024 Date of next office visit: 07/09/2024  Request for Surgical Clearance    Procedure:  Left Shoulder Arthroscopy  Date of Surgery:  Clearance TBD                                 Surgeon:  Dr. Vinie Fonder Surgeon's Group or Practice Name:  [facility] Phone number:  None indicated.  Fax number:  [phone]   Type of Clearance Requested:   - Medical    Type of Anesthesia:  General Interscalene Block   Additional requests/questions:    Signed, Edsel Grayce Sanders   04/16/2024, 12:31 PM   "

## 2024-07-02 ENCOUNTER — Ambulatory Visit: Admitting: Pulmonary Disease

## 2024-07-09 ENCOUNTER — Ambulatory Visit: Admitting: Cardiology
